# Patient Record
Sex: Female | Born: 1940 | Race: White | Hispanic: No | Smoking: Former smoker
Health system: Southern US, Community
[De-identification: ages and names within clinical notes are randomized; demographics above are authoritative.]

## PROBLEM LIST (undated history)

## (undated) DIAGNOSIS — E119 Type 2 diabetes mellitus without complications: Secondary | ICD-10-CM

## (undated) DIAGNOSIS — C3490 Malignant neoplasm of unspecified part of unspecified bronchus or lung: Secondary | ICD-10-CM

## (undated) DIAGNOSIS — I10 Essential (primary) hypertension: Secondary | ICD-10-CM

## (undated) DIAGNOSIS — C719 Malignant neoplasm of brain, unspecified: Secondary | ICD-10-CM

## (undated) HISTORY — PX: ABDOMINAL HYSTERECTOMY: SHX81

## (undated) HISTORY — DX: Essential (primary) hypertension: I10

## (undated) HISTORY — DX: Malignant neoplasm of unspecified part of unspecified bronchus or lung: C34.90

## (undated) HISTORY — DX: Malignant neoplasm of brain, unspecified: C71.9

## (undated) HISTORY — DX: Type 2 diabetes mellitus without complications: E11.9

## (undated) HISTORY — PX: APPENDECTOMY: SHX54

---

## 1997-01-27 DIAGNOSIS — I10 Essential (primary) hypertension: Secondary | ICD-10-CM | POA: Insufficient documentation

## 2005-05-27 ENCOUNTER — Ambulatory Visit: Payer: Self-pay

## 2006-09-02 ENCOUNTER — Ambulatory Visit: Payer: Self-pay | Admitting: Family Medicine

## 2006-09-21 ENCOUNTER — Ambulatory Visit: Payer: Self-pay | Admitting: Family Medicine

## 2007-02-18 ENCOUNTER — Inpatient Hospital Stay: Payer: Self-pay | Admitting: Internal Medicine

## 2008-02-13 ENCOUNTER — Emergency Department: Payer: Self-pay | Admitting: Emergency Medicine

## 2008-08-02 ENCOUNTER — Emergency Department: Payer: Self-pay | Admitting: Emergency Medicine

## 2008-08-02 ENCOUNTER — Other Ambulatory Visit: Payer: Self-pay

## 2008-10-26 ENCOUNTER — Emergency Department: Payer: Self-pay | Admitting: Emergency Medicine

## 2008-11-13 ENCOUNTER — Emergency Department: Payer: Self-pay | Admitting: Emergency Medicine

## 2008-11-14 ENCOUNTER — Emergency Department: Payer: Self-pay | Admitting: Emergency Medicine

## 2008-11-16 ENCOUNTER — Emergency Department: Payer: Self-pay | Admitting: Emergency Medicine

## 2008-11-24 DIAGNOSIS — E119 Type 2 diabetes mellitus without complications: Secondary | ICD-10-CM | POA: Insufficient documentation

## 2009-09-29 ENCOUNTER — Emergency Department: Payer: Self-pay | Admitting: Internal Medicine

## 2011-07-20 ENCOUNTER — Observation Stay: Payer: Self-pay | Admitting: Internal Medicine

## 2011-07-24 ENCOUNTER — Emergency Department: Payer: Self-pay | Admitting: Internal Medicine

## 2011-08-26 DIAGNOSIS — Z9849 Cataract extraction status, unspecified eye: Secondary | ICD-10-CM | POA: Insufficient documentation

## 2012-08-30 DIAGNOSIS — H35369 Drusen (degenerative) of macula, unspecified eye: Secondary | ICD-10-CM | POA: Insufficient documentation

## 2013-02-16 DIAGNOSIS — B029 Zoster without complications: Secondary | ICD-10-CM | POA: Insufficient documentation

## 2013-03-03 DIAGNOSIS — M109 Gout, unspecified: Secondary | ICD-10-CM | POA: Insufficient documentation

## 2013-03-03 DIAGNOSIS — D472 Monoclonal gammopathy: Secondary | ICD-10-CM | POA: Insufficient documentation

## 2013-03-09 DIAGNOSIS — E119 Type 2 diabetes mellitus without complications: Secondary | ICD-10-CM | POA: Insufficient documentation

## 2013-08-15 DIAGNOSIS — E785 Hyperlipidemia, unspecified: Secondary | ICD-10-CM | POA: Insufficient documentation

## 2013-08-15 DIAGNOSIS — K219 Gastro-esophageal reflux disease without esophagitis: Secondary | ICD-10-CM | POA: Insufficient documentation

## 2014-12-19 DIAGNOSIS — M545 Low back pain: Secondary | ICD-10-CM | POA: Diagnosis not present

## 2015-01-02 DIAGNOSIS — B029 Zoster without complications: Secondary | ICD-10-CM | POA: Diagnosis not present

## 2015-01-02 DIAGNOSIS — Z6826 Body mass index (BMI) 26.0-26.9, adult: Secondary | ICD-10-CM | POA: Diagnosis not present

## 2015-01-29 DIAGNOSIS — E119 Type 2 diabetes mellitus without complications: Secondary | ICD-10-CM | POA: Diagnosis not present

## 2015-01-29 DIAGNOSIS — Z6826 Body mass index (BMI) 26.0-26.9, adult: Secondary | ICD-10-CM | POA: Diagnosis not present

## 2015-01-29 DIAGNOSIS — M5412 Radiculopathy, cervical region: Secondary | ICD-10-CM | POA: Diagnosis not present

## 2015-02-05 DIAGNOSIS — G9589 Other specified diseases of spinal cord: Secondary | ICD-10-CM | POA: Diagnosis not present

## 2015-02-05 DIAGNOSIS — M5412 Radiculopathy, cervical region: Secondary | ICD-10-CM | POA: Diagnosis not present

## 2015-02-05 DIAGNOSIS — G9520 Unspecified cord compression: Secondary | ICD-10-CM | POA: Diagnosis not present

## 2015-02-05 DIAGNOSIS — M4722 Other spondylosis with radiculopathy, cervical region: Secondary | ICD-10-CM | POA: Diagnosis not present

## 2015-02-28 DIAGNOSIS — M5412 Radiculopathy, cervical region: Secondary | ICD-10-CM | POA: Diagnosis not present

## 2015-03-12 DIAGNOSIS — M5412 Radiculopathy, cervical region: Secondary | ICD-10-CM | POA: Diagnosis not present

## 2015-03-15 DIAGNOSIS — H04123 Dry eye syndrome of bilateral lacrimal glands: Secondary | ICD-10-CM | POA: Diagnosis not present

## 2015-03-15 DIAGNOSIS — H01006 Unspecified blepharitis left eye, unspecified eyelid: Secondary | ICD-10-CM | POA: Diagnosis not present

## 2015-03-15 DIAGNOSIS — H01003 Unspecified blepharitis right eye, unspecified eyelid: Secondary | ICD-10-CM | POA: Diagnosis not present

## 2015-03-15 DIAGNOSIS — H01009 Unspecified blepharitis unspecified eye, unspecified eyelid: Secondary | ICD-10-CM | POA: Diagnosis not present

## 2015-03-21 DIAGNOSIS — M5412 Radiculopathy, cervical region: Secondary | ICD-10-CM | POA: Diagnosis not present

## 2015-03-28 DIAGNOSIS — M5412 Radiculopathy, cervical region: Secondary | ICD-10-CM | POA: Diagnosis not present

## 2015-04-24 DIAGNOSIS — H5711 Ocular pain, right eye: Secondary | ICD-10-CM | POA: Diagnosis not present

## 2015-04-24 DIAGNOSIS — H00019 Hordeolum externum unspecified eye, unspecified eyelid: Secondary | ICD-10-CM | POA: Diagnosis not present

## 2015-04-24 DIAGNOSIS — H04123 Dry eye syndrome of bilateral lacrimal glands: Secondary | ICD-10-CM | POA: Diagnosis not present

## 2015-04-24 DIAGNOSIS — Z9841 Cataract extraction status, right eye: Secondary | ICD-10-CM | POA: Diagnosis not present

## 2015-05-04 DIAGNOSIS — H01003 Unspecified blepharitis right eye, unspecified eyelid: Secondary | ICD-10-CM | POA: Diagnosis not present

## 2015-05-04 DIAGNOSIS — H04123 Dry eye syndrome of bilateral lacrimal glands: Secondary | ICD-10-CM | POA: Diagnosis not present

## 2015-05-04 DIAGNOSIS — H01006 Unspecified blepharitis left eye, unspecified eyelid: Secondary | ICD-10-CM | POA: Diagnosis not present

## 2015-05-04 DIAGNOSIS — H35363 Drusen (degenerative) of macula, bilateral: Secondary | ICD-10-CM | POA: Diagnosis not present

## 2015-05-04 DIAGNOSIS — E119 Type 2 diabetes mellitus without complications: Secondary | ICD-10-CM | POA: Diagnosis not present

## 2015-05-04 DIAGNOSIS — Z961 Presence of intraocular lens: Secondary | ICD-10-CM | POA: Diagnosis not present

## 2015-05-04 DIAGNOSIS — H01009 Unspecified blepharitis unspecified eye, unspecified eyelid: Secondary | ICD-10-CM | POA: Diagnosis not present

## 2015-05-25 DIAGNOSIS — H35363 Drusen (degenerative) of macula, bilateral: Secondary | ICD-10-CM | POA: Diagnosis not present

## 2015-05-25 DIAGNOSIS — M3501 Sicca syndrome with keratoconjunctivitis: Secondary | ICD-10-CM | POA: Diagnosis not present

## 2015-05-25 DIAGNOSIS — H01003 Unspecified blepharitis right eye, unspecified eyelid: Secondary | ICD-10-CM | POA: Diagnosis not present

## 2015-05-25 DIAGNOSIS — F17200 Nicotine dependence, unspecified, uncomplicated: Secondary | ICD-10-CM | POA: Diagnosis not present

## 2015-05-25 DIAGNOSIS — H01006 Unspecified blepharitis left eye, unspecified eyelid: Secondary | ICD-10-CM | POA: Diagnosis not present

## 2015-07-13 DIAGNOSIS — K219 Gastro-esophageal reflux disease without esophagitis: Secondary | ICD-10-CM | POA: Diagnosis not present

## 2015-07-13 DIAGNOSIS — M810 Age-related osteoporosis without current pathological fracture: Secondary | ICD-10-CM | POA: Diagnosis not present

## 2015-07-13 DIAGNOSIS — Z0000 Encounter for general adult medical examination without abnormal findings: Secondary | ICD-10-CM | POA: Diagnosis not present

## 2015-07-13 DIAGNOSIS — E119 Type 2 diabetes mellitus without complications: Secondary | ICD-10-CM | POA: Diagnosis not present

## 2015-07-13 DIAGNOSIS — E785 Hyperlipidemia, unspecified: Secondary | ICD-10-CM | POA: Diagnosis not present

## 2015-07-13 DIAGNOSIS — H01009 Unspecified blepharitis unspecified eye, unspecified eyelid: Secondary | ICD-10-CM | POA: Diagnosis not present

## 2015-07-13 DIAGNOSIS — H9193 Unspecified hearing loss, bilateral: Secondary | ICD-10-CM | POA: Diagnosis not present

## 2015-07-13 DIAGNOSIS — I10 Essential (primary) hypertension: Secondary | ICD-10-CM | POA: Diagnosis not present

## 2015-08-01 DIAGNOSIS — N390 Urinary tract infection, site not specified: Secondary | ICD-10-CM | POA: Diagnosis not present

## 2015-08-01 DIAGNOSIS — I10 Essential (primary) hypertension: Secondary | ICD-10-CM | POA: Diagnosis not present

## 2015-10-16 DIAGNOSIS — E119 Type 2 diabetes mellitus without complications: Secondary | ICD-10-CM | POA: Diagnosis not present

## 2015-10-16 DIAGNOSIS — M3501 Sicca syndrome with keratoconjunctivitis: Secondary | ICD-10-CM | POA: Diagnosis not present

## 2015-10-16 DIAGNOSIS — Z9841 Cataract extraction status, right eye: Secondary | ICD-10-CM | POA: Diagnosis not present

## 2015-10-16 DIAGNOSIS — Z0389 Encounter for observation for other suspected diseases and conditions ruled out: Secondary | ICD-10-CM | POA: Diagnosis not present

## 2015-10-16 DIAGNOSIS — Z961 Presence of intraocular lens: Secondary | ICD-10-CM | POA: Diagnosis not present

## 2015-10-16 DIAGNOSIS — H0289 Other specified disorders of eyelid: Secondary | ICD-10-CM | POA: Diagnosis not present

## 2015-10-16 DIAGNOSIS — Z9842 Cataract extraction status, left eye: Secondary | ICD-10-CM | POA: Diagnosis not present

## 2015-12-12 DIAGNOSIS — Z720 Tobacco use: Secondary | ICD-10-CM | POA: Diagnosis not present

## 2015-12-12 DIAGNOSIS — I10 Essential (primary) hypertension: Secondary | ICD-10-CM | POA: Diagnosis not present

## 2015-12-12 DIAGNOSIS — N183 Chronic kidney disease, stage 3 (moderate): Secondary | ICD-10-CM | POA: Diagnosis not present

## 2015-12-12 DIAGNOSIS — E119 Type 2 diabetes mellitus without complications: Secondary | ICD-10-CM | POA: Diagnosis not present

## 2015-12-31 DIAGNOSIS — Z87891 Personal history of nicotine dependence: Secondary | ICD-10-CM | POA: Diagnosis not present

## 2016-02-29 DIAGNOSIS — Z122 Encounter for screening for malignant neoplasm of respiratory organs: Secondary | ICD-10-CM | POA: Diagnosis not present

## 2016-02-29 DIAGNOSIS — R918 Other nonspecific abnormal finding of lung field: Secondary | ICD-10-CM | POA: Diagnosis not present

## 2016-02-29 DIAGNOSIS — S2232XA Fracture of one rib, left side, initial encounter for closed fracture: Secondary | ICD-10-CM | POA: Diagnosis not present

## 2016-02-29 DIAGNOSIS — Z87891 Personal history of nicotine dependence: Secondary | ICD-10-CM | POA: Diagnosis not present

## 2016-03-18 DIAGNOSIS — R918 Other nonspecific abnormal finding of lung field: Secondary | ICD-10-CM | POA: Diagnosis not present

## 2016-03-18 DIAGNOSIS — R0602 Shortness of breath: Secondary | ICD-10-CM | POA: Diagnosis not present

## 2016-04-11 DIAGNOSIS — M47812 Spondylosis without myelopathy or radiculopathy, cervical region: Secondary | ICD-10-CM | POA: Diagnosis not present

## 2016-04-11 DIAGNOSIS — M542 Cervicalgia: Secondary | ICD-10-CM | POA: Diagnosis not present

## 2016-04-11 DIAGNOSIS — R918 Other nonspecific abnormal finding of lung field: Secondary | ICD-10-CM | POA: Diagnosis not present

## 2016-04-11 DIAGNOSIS — Z7189 Other specified counseling: Secondary | ICD-10-CM | POA: Diagnosis not present

## 2016-04-11 DIAGNOSIS — M4800 Spinal stenosis, site unspecified: Secondary | ICD-10-CM | POA: Diagnosis not present

## 2016-04-11 DIAGNOSIS — Z66 Do not resuscitate: Secondary | ICD-10-CM | POA: Diagnosis not present

## 2016-04-11 DIAGNOSIS — M5030 Other cervical disc degeneration, unspecified cervical region: Secondary | ICD-10-CM | POA: Diagnosis not present

## 2016-04-14 DIAGNOSIS — Z66 Do not resuscitate: Secondary | ICD-10-CM | POA: Insufficient documentation

## 2016-05-06 DIAGNOSIS — N390 Urinary tract infection, site not specified: Secondary | ICD-10-CM | POA: Diagnosis not present

## 2016-07-10 DIAGNOSIS — K219 Gastro-esophageal reflux disease without esophagitis: Secondary | ICD-10-CM | POA: Diagnosis not present

## 2016-07-10 DIAGNOSIS — F17200 Nicotine dependence, unspecified, uncomplicated: Secondary | ICD-10-CM | POA: Diagnosis not present

## 2016-07-10 DIAGNOSIS — E119 Type 2 diabetes mellitus without complications: Secondary | ICD-10-CM | POA: Diagnosis not present

## 2016-07-10 DIAGNOSIS — M109 Gout, unspecified: Secondary | ICD-10-CM | POA: Diagnosis not present

## 2016-07-10 DIAGNOSIS — M8580 Other specified disorders of bone density and structure, unspecified site: Secondary | ICD-10-CM | POA: Diagnosis not present

## 2016-07-10 DIAGNOSIS — Z23 Encounter for immunization: Secondary | ICD-10-CM | POA: Diagnosis not present

## 2016-07-10 DIAGNOSIS — G8929 Other chronic pain: Secondary | ICD-10-CM | POA: Diagnosis not present

## 2016-07-10 DIAGNOSIS — I10 Essential (primary) hypertension: Secondary | ICD-10-CM | POA: Diagnosis not present

## 2016-12-12 DIAGNOSIS — R29898 Other symptoms and signs involving the musculoskeletal system: Secondary | ICD-10-CM | POA: Diagnosis not present

## 2016-12-17 DIAGNOSIS — R29898 Other symptoms and signs involving the musculoskeletal system: Secondary | ICD-10-CM | POA: Diagnosis not present

## 2016-12-19 DIAGNOSIS — M9971 Connective tissue and disc stenosis of intervertebral foramina of cervical region: Secondary | ICD-10-CM | POA: Diagnosis not present

## 2016-12-19 DIAGNOSIS — M542 Cervicalgia: Secondary | ICD-10-CM | POA: Diagnosis not present

## 2016-12-19 DIAGNOSIS — G9520 Unspecified cord compression: Secondary | ICD-10-CM | POA: Diagnosis not present

## 2016-12-24 DIAGNOSIS — M5030 Other cervical disc degeneration, unspecified cervical region: Secondary | ICD-10-CM | POA: Diagnosis not present

## 2016-12-24 DIAGNOSIS — M5412 Radiculopathy, cervical region: Secondary | ICD-10-CM | POA: Diagnosis not present

## 2016-12-24 DIAGNOSIS — R29898 Other symptoms and signs involving the musculoskeletal system: Secondary | ICD-10-CM | POA: Diagnosis not present

## 2016-12-24 DIAGNOSIS — M542 Cervicalgia: Secondary | ICD-10-CM | POA: Diagnosis not present

## 2016-12-24 DIAGNOSIS — M4802 Spinal stenosis, cervical region: Secondary | ICD-10-CM | POA: Diagnosis not present

## 2016-12-29 DIAGNOSIS — I10 Essential (primary) hypertension: Secondary | ICD-10-CM | POA: Diagnosis not present

## 2016-12-29 DIAGNOSIS — E119 Type 2 diabetes mellitus without complications: Secondary | ICD-10-CM | POA: Diagnosis not present

## 2016-12-31 DIAGNOSIS — C3480 Malignant neoplasm of overlapping sites of unspecified bronchus and lung: Secondary | ICD-10-CM | POA: Diagnosis not present

## 2016-12-31 DIAGNOSIS — M4802 Spinal stenosis, cervical region: Secondary | ICD-10-CM | POA: Diagnosis not present

## 2016-12-31 DIAGNOSIS — R930 Abnormal findings on diagnostic imaging of skull and head, not elsewhere classified: Secondary | ICD-10-CM | POA: Diagnosis not present

## 2016-12-31 DIAGNOSIS — E0965 Drug or chemical induced diabetes mellitus with hyperglycemia: Secondary | ICD-10-CM | POA: Diagnosis not present

## 2016-12-31 DIAGNOSIS — G939 Disorder of brain, unspecified: Secondary | ICD-10-CM | POA: Diagnosis not present

## 2016-12-31 DIAGNOSIS — M1000 Idiopathic gout, unspecified site: Secondary | ICD-10-CM | POA: Diagnosis not present

## 2016-12-31 DIAGNOSIS — R531 Weakness: Secondary | ICD-10-CM | POA: Diagnosis not present

## 2016-12-31 DIAGNOSIS — G8194 Hemiplegia, unspecified affecting left nondominant side: Secondary | ICD-10-CM | POA: Diagnosis not present

## 2016-12-31 DIAGNOSIS — R918 Other nonspecific abnormal finding of lung field: Secondary | ICD-10-CM | POA: Diagnosis not present

## 2016-12-31 DIAGNOSIS — F17210 Nicotine dependence, cigarettes, uncomplicated: Secondary | ICD-10-CM | POA: Diagnosis not present

## 2016-12-31 DIAGNOSIS — K219 Gastro-esophageal reflux disease without esophagitis: Secondary | ICD-10-CM | POA: Diagnosis not present

## 2016-12-31 DIAGNOSIS — G9389 Other specified disorders of brain: Secondary | ICD-10-CM | POA: Diagnosis not present

## 2016-12-31 DIAGNOSIS — I10 Essential (primary) hypertension: Secondary | ICD-10-CM | POA: Diagnosis not present

## 2016-12-31 DIAGNOSIS — G729 Myopathy, unspecified: Secondary | ICD-10-CM | POA: Diagnosis not present

## 2016-12-31 DIAGNOSIS — Z888 Allergy status to other drugs, medicaments and biological substances status: Secondary | ICD-10-CM | POA: Diagnosis not present

## 2016-12-31 DIAGNOSIS — M810 Age-related osteoporosis without current pathological fracture: Secondary | ICD-10-CM | POA: Diagnosis not present

## 2016-12-31 DIAGNOSIS — G9519 Other vascular myelopathies: Secondary | ICD-10-CM | POA: Diagnosis not present

## 2016-12-31 DIAGNOSIS — G959 Disease of spinal cord, unspecified: Secondary | ICD-10-CM | POA: Diagnosis not present

## 2016-12-31 DIAGNOSIS — M6281 Muscle weakness (generalized): Secondary | ICD-10-CM | POA: Diagnosis not present

## 2016-12-31 DIAGNOSIS — R590 Localized enlarged lymph nodes: Secondary | ICD-10-CM | POA: Diagnosis not present

## 2016-12-31 DIAGNOSIS — G9520 Unspecified cord compression: Secondary | ICD-10-CM | POA: Diagnosis not present

## 2016-12-31 DIAGNOSIS — C3490 Malignant neoplasm of unspecified part of unspecified bronchus or lung: Secondary | ICD-10-CM | POA: Diagnosis not present

## 2016-12-31 DIAGNOSIS — D491 Neoplasm of unspecified behavior of respiratory system: Secondary | ICD-10-CM | POA: Diagnosis not present

## 2016-12-31 DIAGNOSIS — C7931 Secondary malignant neoplasm of brain: Secondary | ICD-10-CM | POA: Diagnosis not present

## 2016-12-31 DIAGNOSIS — E871 Hypo-osmolality and hyponatremia: Secondary | ICD-10-CM | POA: Diagnosis not present

## 2016-12-31 DIAGNOSIS — K769 Liver disease, unspecified: Secondary | ICD-10-CM | POA: Diagnosis not present

## 2016-12-31 DIAGNOSIS — C7951 Secondary malignant neoplasm of bone: Secondary | ICD-10-CM | POA: Diagnosis not present

## 2016-12-31 DIAGNOSIS — M109 Gout, unspecified: Secondary | ICD-10-CM | POA: Diagnosis not present

## 2017-01-02 DIAGNOSIS — G9389 Other specified disorders of brain: Secondary | ICD-10-CM | POA: Insufficient documentation

## 2017-01-03 DIAGNOSIS — C7800 Secondary malignant neoplasm of unspecified lung: Secondary | ICD-10-CM | POA: Insufficient documentation

## 2017-01-07 DIAGNOSIS — C7931 Secondary malignant neoplasm of brain: Secondary | ICD-10-CM | POA: Insufficient documentation

## 2017-01-07 DIAGNOSIS — C3480 Malignant neoplasm of overlapping sites of unspecified bronchus and lung: Secondary | ICD-10-CM | POA: Diagnosis not present

## 2017-01-14 DIAGNOSIS — G9389 Other specified disorders of brain: Secondary | ICD-10-CM | POA: Diagnosis not present

## 2017-01-14 DIAGNOSIS — D72829 Elevated white blood cell count, unspecified: Secondary | ICD-10-CM | POA: Diagnosis not present

## 2017-01-14 DIAGNOSIS — G8194 Hemiplegia, unspecified affecting left nondominant side: Secondary | ICD-10-CM | POA: Diagnosis not present

## 2017-01-14 DIAGNOSIS — N390 Urinary tract infection, site not specified: Secondary | ICD-10-CM | POA: Diagnosis not present

## 2017-01-14 DIAGNOSIS — E1165 Type 2 diabetes mellitus with hyperglycemia: Secondary | ICD-10-CM | POA: Diagnosis not present

## 2017-01-14 DIAGNOSIS — M5001 Cervical disc disorder with myelopathy,  high cervical region: Secondary | ICD-10-CM | POA: Diagnosis not present

## 2017-01-14 DIAGNOSIS — C3490 Malignant neoplasm of unspecified part of unspecified bronchus or lung: Secondary | ICD-10-CM | POA: Diagnosis not present

## 2017-01-14 DIAGNOSIS — R300 Dysuria: Secondary | ICD-10-CM | POA: Diagnosis not present

## 2017-01-14 DIAGNOSIS — E222 Syndrome of inappropriate secretion of antidiuretic hormone: Secondary | ICD-10-CM | POA: Diagnosis not present

## 2017-01-14 DIAGNOSIS — G729 Myopathy, unspecified: Secondary | ICD-10-CM | POA: Diagnosis not present

## 2017-01-14 DIAGNOSIS — C7931 Secondary malignant neoplasm of brain: Secondary | ICD-10-CM | POA: Diagnosis not present

## 2017-01-14 DIAGNOSIS — M1000 Idiopathic gout, unspecified site: Secondary | ICD-10-CM | POA: Diagnosis not present

## 2017-01-14 DIAGNOSIS — I10 Essential (primary) hypertension: Secondary | ICD-10-CM | POA: Diagnosis not present

## 2017-01-14 DIAGNOSIS — C7951 Secondary malignant neoplasm of bone: Secondary | ICD-10-CM | POA: Diagnosis not present

## 2017-01-14 DIAGNOSIS — G992 Myelopathy in diseases classified elsewhere: Secondary | ICD-10-CM | POA: Diagnosis not present

## 2017-01-14 DIAGNOSIS — C3480 Malignant neoplasm of overlapping sites of unspecified bronchus and lung: Secondary | ICD-10-CM | POA: Diagnosis not present

## 2017-01-14 DIAGNOSIS — M810 Age-related osteoporosis without current pathological fracture: Secondary | ICD-10-CM | POA: Diagnosis not present

## 2017-01-14 DIAGNOSIS — E871 Hypo-osmolality and hyponatremia: Secondary | ICD-10-CM | POA: Diagnosis not present

## 2017-01-14 DIAGNOSIS — C787 Secondary malignant neoplasm of liver and intrahepatic bile duct: Secondary | ICD-10-CM | POA: Diagnosis not present

## 2017-01-14 DIAGNOSIS — K219 Gastro-esophageal reflux disease without esophagitis: Secondary | ICD-10-CM | POA: Diagnosis not present

## 2017-01-24 DIAGNOSIS — E1122 Type 2 diabetes mellitus with diabetic chronic kidney disease: Secondary | ICD-10-CM | POA: Diagnosis not present

## 2017-01-24 DIAGNOSIS — G8252 Quadriplegia, C1-C4 incomplete: Secondary | ICD-10-CM | POA: Diagnosis not present

## 2017-01-24 DIAGNOSIS — C787 Secondary malignant neoplasm of liver and intrahepatic bile duct: Secondary | ICD-10-CM | POA: Diagnosis not present

## 2017-01-24 DIAGNOSIS — C3490 Malignant neoplasm of unspecified part of unspecified bronchus or lung: Secondary | ICD-10-CM | POA: Diagnosis not present

## 2017-01-24 DIAGNOSIS — C7931 Secondary malignant neoplasm of brain: Secondary | ICD-10-CM | POA: Diagnosis not present

## 2017-01-24 DIAGNOSIS — N183 Chronic kidney disease, stage 3 (moderate): Secondary | ICD-10-CM | POA: Diagnosis not present

## 2017-01-24 DIAGNOSIS — C7951 Secondary malignant neoplasm of bone: Secondary | ICD-10-CM | POA: Diagnosis not present

## 2017-01-24 DIAGNOSIS — S14104S Unspecified injury at C4 level of cervical spinal cord, sequela: Secondary | ICD-10-CM | POA: Diagnosis not present

## 2017-01-24 DIAGNOSIS — I129 Hypertensive chronic kidney disease with stage 1 through stage 4 chronic kidney disease, or unspecified chronic kidney disease: Secondary | ICD-10-CM | POA: Diagnosis not present

## 2017-01-27 DIAGNOSIS — G939 Disorder of brain, unspecified: Secondary | ICD-10-CM | POA: Diagnosis not present

## 2017-01-28 DIAGNOSIS — E1122 Type 2 diabetes mellitus with diabetic chronic kidney disease: Secondary | ICD-10-CM | POA: Diagnosis not present

## 2017-01-28 DIAGNOSIS — C7951 Secondary malignant neoplasm of bone: Secondary | ICD-10-CM | POA: Diagnosis not present

## 2017-01-28 DIAGNOSIS — G8252 Quadriplegia, C1-C4 incomplete: Secondary | ICD-10-CM | POA: Diagnosis not present

## 2017-01-28 DIAGNOSIS — N183 Chronic kidney disease, stage 3 (moderate): Secondary | ICD-10-CM | POA: Diagnosis not present

## 2017-01-28 DIAGNOSIS — C7931 Secondary malignant neoplasm of brain: Secondary | ICD-10-CM | POA: Diagnosis not present

## 2017-01-28 DIAGNOSIS — S14104S Unspecified injury at C4 level of cervical spinal cord, sequela: Secondary | ICD-10-CM | POA: Diagnosis not present

## 2017-01-28 DIAGNOSIS — C787 Secondary malignant neoplasm of liver and intrahepatic bile duct: Secondary | ICD-10-CM | POA: Diagnosis not present

## 2017-01-28 DIAGNOSIS — C3490 Malignant neoplasm of unspecified part of unspecified bronchus or lung: Secondary | ICD-10-CM | POA: Diagnosis not present

## 2017-01-28 DIAGNOSIS — I129 Hypertensive chronic kidney disease with stage 1 through stage 4 chronic kidney disease, or unspecified chronic kidney disease: Secondary | ICD-10-CM | POA: Diagnosis not present

## 2017-01-29 DIAGNOSIS — C3490 Malignant neoplasm of unspecified part of unspecified bronchus or lung: Secondary | ICD-10-CM | POA: Insufficient documentation

## 2017-01-29 DIAGNOSIS — C7931 Secondary malignant neoplasm of brain: Secondary | ICD-10-CM | POA: Diagnosis not present

## 2017-01-29 DIAGNOSIS — C7800 Secondary malignant neoplasm of unspecified lung: Secondary | ICD-10-CM | POA: Diagnosis not present

## 2017-01-29 DIAGNOSIS — E119 Type 2 diabetes mellitus without complications: Secondary | ICD-10-CM | POA: Diagnosis not present

## 2017-01-29 NOTE — Progress Notes (Signed)
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   INTERVAL HISTORY: Patient is a 76 year old female who initially presented with left upper and lower extremity weakness thought to be secondary to cervical stenosis. Subsequent MRI the brain revealed a large peripherally enhancing lesion in her right frontal lobe. Further workup with EBUS on January 05, 2017 confirmed small cell carcinoma of the lung. Patient's symptoms improved significantly with steroids. She completed XRT at [facility] and now is referred for continuation of systemic treatment. Currently she feels well. She has no new neurologic complaints. She denies any recent fevers or illnesses. She has a fair appetite, but denies weight loss. She has no chest pain, shortness of breath, cough, or hemoptysis. She denies any nausea, vomiting, constipation, or diarrhea. She has no urinary complaints. Patient otherwise feels well and offers no further specific complaints.  REVIEW OF SYSTEMS:   Review of Systems  Constitutional: Positive for malaise/fatigue. Negative for fever and weight loss.  Respiratory: Negative.  Negative for cough, hemoptysis and shortness of breath.   Cardiovascular: Negative.  Negative for chest pain and leg swelling.  Gastrointestinal: Negative.  Negative for abdominal pain.  Genitourinary: Negative.   Musculoskeletal: Negative.   Neurological: Positive for weakness. Negative for dizziness, sensory change, focal weakness, seizures and headaches.  Psychiatric/Behavioral: Negative.  The patient is not nervous/anxious.     As per HPI. Otherwise, a complete review of systems is negative.  PAST MEDICAL HISTORY: Past Medical  History:  Diagnosis Date  . Brain cancer ([place])   . Diabetes mellitus without complication ([place])   . Hypertension   . Lung cancer ([place])     PAST SURGICAL HISTORY: Past Surgical History:  Procedure Laterality Date  . ABDOMINAL HYSTERECTOMY     partial  . APPENDECTOMY      FAMILY HISTORY: Family History  Problem Relation Age of Onset  . Diabetes Mother   . Melanoma Sister   . Ovarian cancer Sister   . Diabetes Sister   . Diabetes Brother   . Diabetes Maternal Grandmother   . Diabetes Paternal Grandmother     ADVANCED DIRECTIVES (Y/N):  N  HEALTH MAINTENANCE: Social History  Substance Use Topics  . Smoking status: Former Research scientist (life sciences)  . Smokeless tobacco: Never Used  . Alcohol use No     Colonoscopy:  PAP:  Bone density:  Lipid panel:  Allergies  Allergen Reactions  . Ace Inhibitors Anaphylaxis  . Allopurinol Anaphylaxis  . Angiotensin Receptor Blockers Anaphylaxis  . Codeine Anaphylaxis  . Enalapril Swelling    Other reaction(s): SWELLING/EDEMA  . Furosemide Swelling    Other reaction(s): OTHER  . Hydralazine Swelling    Other reaction(s): SWELLING/EDEMA  . Hydrochlorothiazide Anaphylaxis  . Irbesartan Swelling    Other reaction(s): SWELLING/EDEMA  . Levofloxacin Swelling    Other reaction(s): SWELLING/EDEMA  . Lidocaine Swelling    Other reaction(s): SWELLING/EDEMA  . Naproxen Sodium Anaphylaxis  . Spironolactone Anaphylaxis  . Tramadol Anaphylaxis    Other reaction(s): NAUSEA    Current Outpatient Prescriptions  Medication Sig Dispense Refill  . alendronate (FOSAMAX) 70 MG tablet Take 70 mg by mouth every Friday.     Marland Kitchen amLODipine (NORVASC) 10 MG tablet Take 10 mg by mouth at  bedtime.     Marland Kitchen aspirin EC 81 MG tablet Take 81 mg by mouth daily.     . Calcium Carb-Cholecalciferol 600-800 MG-UNIT TABS Take 1 tablet by mouth daily.     . carvedilol (COREG) 25 MG tablet Take 50 mg by mouth 2 (two) times daily with a meal.     . cetirizine (ZYRTEC) 10 MG  tablet Take 10 mg by mouth at bedtime.     . cloNIDine (CATAPRES - DOSED IN MG/24 HR) 0.3 mg/24hr patch Place 0.3 mg onto the skin once a week. Change patch on Wednesdays    . colchicine-probenecid 0.5-500 MG tablet Take 1 tablet by mouth 2 (two) times daily.     Marland Kitchen docusate sodium (COLACE) 100 MG capsule Take 400 mg by mouth at bedtime.     Marland Kitchen erythromycin ophthalmic ointment Apply to eyelid margins every night    . esomeprazole (NEXIUM) 40 MG capsule Take 40 mg by mouth daily.     Marland Kitchen gabapentin (NEURONTIN) 300 MG capsule Take 300 mg by mouth 3 (three) times daily.     Marland Kitchen glucose blood (ACCU-CHEK COMPACT STRIPS) test strip Test daily before all meals/snacks and once before bedtime.    . Multiple Vitamins-Minerals (PRESERVISION AREDS 2 PO) Take 1 tablet by mouth 2 (two) times daily.    . nicotine (NICODERM CQ - DOSED IN MG/24 HOURS) 14 mg/24hr patch Place 14 mg onto the skin daily.     . simvastatin (ZOCOR) 20 MG tablet Take 20 mg by mouth every evening.     Marland Kitchen acetaminophen (TYLENOL) 500 MG tablet Take 500 mg by mouth 2 (two) times daily as needed for moderate pain.    . cholecalciferol (VITAMIN D) 400 units TABS tablet Take 400 Units by mouth daily.    . Hypromellose (ARTIFICIAL TEARS OP) Apply 1 drop to eye daily as needed (dry eyes).    . Melatonin 10 MG TABS Take 20 mg by mouth at bedtime.    . traZODone (DESYREL) 50 MG tablet Take 50 mg by mouth at bedtime as needed for sleep.      No current facility-administered medications for this visit.     OBJECTIVE: Vitals:   01/30/17 0908  BP: (!) 162/71  Pulse: 68  Resp: 18  Temp: 97.2 F (36.2 C)     There is no height or weight on file to calculate BMI.    ECOG FS:1 - Symptomatic but completely ambulatory  General: Well-developed, well-nourished, no acute distress. Eyes: Pink conjunctiva, anicteric sclera. HEENT: Normocephalic, moist mucous membranes, clear oropharnyx. Lungs: Clear to auscultation bilaterally. Heart: Regular rate and  rhythm. No rubs, murmurs, or gallops. Abdomen: Soft, nontender, nondistended. No organomegaly noted, normoactive bowel sounds. Musculoskeletal: No edema, cyanosis, or clubbing. Neuro: Alert, answering all questions appropriately. Cranial nerves grossly intact. Skin: No rashes or petechiae noted. Psych: Normal affect. Lymphatics: No cervical, calvicular, axillary or inguinal LAD.   LAB RESULTS:  No results found for: NA, K, CL, CO2, GLUCOSE, BUN, CREATININE, CALCIUM, PROT, ALBUMIN, AST, ALT, ALKPHOS, BILITOT, GFRNONAA, GFRAA  No results found for: WBC, NEUTROABS, HGB, HCT, MCV, PLT   STUDIES: No results found.  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   PLAN:    1. 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 No bone, hepatic, or other suspicious lesions were noted outside the brain. Primary lesion appears to be a 1.8 cm right upper lobe nodule. Patient was noted to have a 1.6 cm left hepatic lesion, but this was indeterminate and possibly a hemangioma. Liver lesion was PET negative. Patient has now completed XRT her brain and will now initiate systemic chemotherapy using carboplatinum and etoposide on day 1 with etoposide only on days 2 and 3. Patient will require Neulasta support. Plan to reimage after approximately 4 cycles. Patient has also agreed to port placement. She will return to clinic next week for chemotherapy education class and for placement and then on February 09, 2017 to initiate cycle 1, day 1 of treatment. 2. Metastatic brain lesion: MRI the brain completed at [facility] on January 01, 2017 revealed a 4.5 x 3.4 x 4.3 lesion with rim enhancement in the right frontal lobe. Patient has completed XRT and Decadron  taper with significant improvement of her left sided weakness.  Approximately 45 minutes was spent in discussion of which greater than 50% was consultation.                                                                                                                                                           Patient expressed understanding and was in agreement with this plan. She also understands that She can call clinic at any time with any questions, concerns, or complaints.   Cancer Staging Small cell lung cancer [facility]) Staging form: Lung, AJCC 8th Edition - Clinical stage from 01/31/2017: Stage IVB (cT1b, cN2, cM1c) - Signed by Lloyd Huger, MD on 01/31/2017   Lloyd Huger, MD   01/31/2017 12:40 PM

## 2017-01-30 ENCOUNTER — Other Ambulatory Visit (INDEPENDENT_AMBULATORY_CARE_PROVIDER_SITE_OTHER): Payer: Self-pay | Admitting: Vascular Surgery

## 2017-01-30 ENCOUNTER — Inpatient Hospital Stay: Payer: Medicare HMO | Attending: Oncology | Admitting: Oncology

## 2017-01-30 ENCOUNTER — Ambulatory Visit: Payer: Self-pay | Admitting: Oncology

## 2017-01-30 ENCOUNTER — Encounter: Payer: Self-pay | Admitting: Oncology

## 2017-01-30 VITALS — BP 162/71 | HR 68 | Temp 97.2°F | Resp 18 | Wt 133.4 lb

## 2017-01-30 DIAGNOSIS — R531 Weakness: Secondary | ICD-10-CM | POA: Insufficient documentation

## 2017-01-30 DIAGNOSIS — I10 Essential (primary) hypertension: Secondary | ICD-10-CM | POA: Insufficient documentation

## 2017-01-30 DIAGNOSIS — C3490 Malignant neoplasm of unspecified part of unspecified bronchus or lung: Secondary | ICD-10-CM

## 2017-01-30 DIAGNOSIS — Z79899 Other long term (current) drug therapy: Secondary | ICD-10-CM | POA: Insufficient documentation

## 2017-01-30 DIAGNOSIS — Z7982 Long term (current) use of aspirin: Secondary | ICD-10-CM | POA: Diagnosis not present

## 2017-01-30 DIAGNOSIS — C7931 Secondary malignant neoplasm of brain: Secondary | ICD-10-CM

## 2017-01-30 DIAGNOSIS — R5383 Other fatigue: Secondary | ICD-10-CM | POA: Diagnosis not present

## 2017-01-30 DIAGNOSIS — C3411 Malignant neoplasm of upper lobe, right bronchus or lung: Secondary | ICD-10-CM | POA: Diagnosis not present

## 2017-01-30 DIAGNOSIS — Z87891 Personal history of nicotine dependence: Secondary | ICD-10-CM | POA: Insufficient documentation

## 2017-01-30 DIAGNOSIS — Z7689 Persons encountering health services in other specified circumstances: Secondary | ICD-10-CM | POA: Insufficient documentation

## 2017-01-30 DIAGNOSIS — Z8041 Family history of malignant neoplasm of ovary: Secondary | ICD-10-CM | POA: Insufficient documentation

## 2017-01-30 DIAGNOSIS — Z5111 Encounter for antineoplastic chemotherapy: Secondary | ICD-10-CM | POA: Diagnosis not present

## 2017-01-30 DIAGNOSIS — E119 Type 2 diabetes mellitus without complications: Secondary | ICD-10-CM | POA: Diagnosis not present

## 2017-01-30 DIAGNOSIS — C3491 Malignant neoplasm of unspecified part of right bronchus or lung: Secondary | ICD-10-CM

## 2017-01-30 NOTE — Progress Notes (Signed)
Patient is here for new patient visit. She is doing very well no major complaints

## 2017-01-31 DIAGNOSIS — C7931 Secondary malignant neoplasm of brain: Secondary | ICD-10-CM | POA: Diagnosis not present

## 2017-01-31 DIAGNOSIS — M810 Age-related osteoporosis without current pathological fracture: Secondary | ICD-10-CM | POA: Diagnosis not present

## 2017-01-31 MED ORDER — PROCHLORPERAZINE MALEATE 10 MG PO TABS: 10.0000 mg | ORAL_TABLET | Freq: Four times a day (QID) | ORAL | 2 refills | Status: DC | PRN

## 2017-01-31 MED ORDER — ONDANSETRON HCL 8 MG PO TABS: 8.0000 mg | ORAL_TABLET | Freq: Two times a day (BID) | ORAL | 2 refills | Status: DC | PRN

## 2017-01-31 MED ORDER — LIDOCAINE-PRILOCAINE 2.5-2.5 % EX CREA: TOPICAL_CREAM | CUTANEOUS | 3 refills | Status: DC

## 2017-01-31 NOTE — Progress Notes (Signed)
START ON PATHWAY REGIMEN - Small Cell Lung     A cycle is every 21 days:     Etoposide      Carboplatin   **Always confirm dose/schedule in your pharmacy ordering system**    Patient Characteristics: Extensive Stage, First Line Stage Grouping: Extensive AJCC T Category: T1b AJCC N Category: N2 AJCC M Category: M1c AJCC 8 Stage Grouping: IVB Line of therapy: First Line Would you be surprised if this patient died  in the next year? I would NOT be surprised if this patient died in the next year  Intent of Therapy: Non-Curative / Palliative Intent, Discussed with Patient

## 2017-02-02 DIAGNOSIS — N183 Chronic kidney disease, stage 3 (moderate): Secondary | ICD-10-CM | POA: Diagnosis not present

## 2017-02-02 DIAGNOSIS — C3490 Malignant neoplasm of unspecified part of unspecified bronchus or lung: Secondary | ICD-10-CM | POA: Diagnosis not present

## 2017-02-02 DIAGNOSIS — E1122 Type 2 diabetes mellitus with diabetic chronic kidney disease: Secondary | ICD-10-CM | POA: Diagnosis not present

## 2017-02-02 DIAGNOSIS — G8252 Quadriplegia, C1-C4 incomplete: Secondary | ICD-10-CM | POA: Diagnosis not present

## 2017-02-02 DIAGNOSIS — C787 Secondary malignant neoplasm of liver and intrahepatic bile duct: Secondary | ICD-10-CM | POA: Diagnosis not present

## 2017-02-02 DIAGNOSIS — C7951 Secondary malignant neoplasm of bone: Secondary | ICD-10-CM | POA: Diagnosis not present

## 2017-02-02 DIAGNOSIS — I129 Hypertensive chronic kidney disease with stage 1 through stage 4 chronic kidney disease, or unspecified chronic kidney disease: Secondary | ICD-10-CM | POA: Diagnosis not present

## 2017-02-02 DIAGNOSIS — C7931 Secondary malignant neoplasm of brain: Secondary | ICD-10-CM | POA: Diagnosis not present

## 2017-02-02 DIAGNOSIS — S14104S Unspecified injury at C4 level of cervical spinal cord, sequela: Secondary | ICD-10-CM | POA: Diagnosis not present

## 2017-02-03 DIAGNOSIS — C3490 Malignant neoplasm of unspecified part of unspecified bronchus or lung: Secondary | ICD-10-CM | POA: Diagnosis not present

## 2017-02-03 DIAGNOSIS — C787 Secondary malignant neoplasm of liver and intrahepatic bile duct: Secondary | ICD-10-CM | POA: Diagnosis not present

## 2017-02-03 DIAGNOSIS — N183 Chronic kidney disease, stage 3 (moderate): Secondary | ICD-10-CM | POA: Diagnosis not present

## 2017-02-03 DIAGNOSIS — I129 Hypertensive chronic kidney disease with stage 1 through stage 4 chronic kidney disease, or unspecified chronic kidney disease: Secondary | ICD-10-CM | POA: Diagnosis not present

## 2017-02-03 DIAGNOSIS — S14104S Unspecified injury at C4 level of cervical spinal cord, sequela: Secondary | ICD-10-CM | POA: Diagnosis not present

## 2017-02-03 DIAGNOSIS — C7951 Secondary malignant neoplasm of bone: Secondary | ICD-10-CM | POA: Diagnosis not present

## 2017-02-03 DIAGNOSIS — G8252 Quadriplegia, C1-C4 incomplete: Secondary | ICD-10-CM | POA: Diagnosis not present

## 2017-02-03 DIAGNOSIS — C7931 Secondary malignant neoplasm of brain: Secondary | ICD-10-CM | POA: Diagnosis not present

## 2017-02-03 DIAGNOSIS — E1122 Type 2 diabetes mellitus with diabetic chronic kidney disease: Secondary | ICD-10-CM | POA: Diagnosis not present

## 2017-02-03 MED ORDER — CEFAZOLIN SODIUM-DEXTROSE 2-4 GM/100ML-% IV SOLN
2.0000 g | Freq: Once | INTRAVENOUS | Status: DC
  Filled 2017-02-03: qty 100

## 2017-02-03 MED ORDER — SODIUM CHLORIDE 0.9 % IR SOLN
Freq: Once | Status: DC
  Filled 2017-02-03: qty 2

## 2017-02-03 NOTE — Patient Instructions (Signed)
Etoposide, VP-16 injection What is this medicine? ETOPOSIDE, VP-16 (e toe POE side) is a chemotherapy drug. It is used to treat testicular cancer, lung cancer, and other cancers. This medicine may be used for other purposes; ask your health care provider or pharmacist if you have questions. COMMON BRAND NAME(S): Etopophos, Toposar, VePesid What should I tell my health care provider before I take this medicine? They need to know if you have any of these conditions: -infection -kidney disease -liver disease -low blood counts, like low white cell, platelet, or red cell counts -an unusual or allergic reaction to etoposide, other medicines, foods, dyes, or preservatives -pregnant or trying to get pregnant -breast-feeding How should I use this medicine? This medicine is for infusion into a vein. It is administered in a hospital or clinic by a specially trained health care professional. Talk to your pediatrician regarding the use of this medicine in children. Special care may be needed. Overdosage: If you think you have taken too much of this medicine contact a poison control center or emergency room at once. NOTE: This medicine is only for you. Do not share this medicine with others. What if I miss a dose? It is important not to miss your dose. Call your doctor or health care professional if you are unable to keep an appointment. What may interact with this medicine? -aspirin -certain medications for seizures like carbamazepine, phenobarbital, phenytoin, valproic acid -cyclosporine -levamisole -warfarin This list may not describe all possible interactions. Give your health care provider a list of all the medicines, herbs, non-prescription drugs, or dietary supplements you use. Also tell them if you smoke, drink alcohol, or use illegal drugs. Some items may interact with your medicine. What should I watch for while using this medicine? Visit your doctor for checks on your progress. This drug  may make you feel generally unwell. This is not uncommon, as chemotherapy can affect healthy cells as well as cancer cells. Report any side effects. Continue your course of treatment even though you feel ill unless your doctor tells you to stop. In some cases, you may be given additional medicines to help with side effects. Follow all directions for their use. Call your doctor or health care professional for advice if you get a fever, chills or sore throat, or other symptoms of a cold or flu. Do not treat yourself. This drug decreases your body's ability to fight infections. Try to avoid being around people who are sick. This medicine may increase your risk to bruise or bleed. Call your doctor or health care professional if you notice any unusual bleeding. Talk to your doctor about your risk of cancer. You may be more at risk for certain types of cancers if you take this medicine. Do not become pregnant while taking this medicine or for at least 6 months after stopping it. Women should inform their doctor if they wish to become pregnant or think they might be pregnant. Women of child-bearing potential will need to have a negative pregnancy test before starting this medicine. There is a potential for serious side effects to an unborn child. Talk to your health care professional or pharmacist for more information. Do not breast-feed an infant while taking this medicine. Men must use a latex condom during sexual contact with a woman while taking this medicine and for at least 4 months after stopping it. A latex condom is needed even if you have had a vasectomy. Contact your doctor right away if your partner becomes pregnant. Do   not donate sperm while taking this medicine and for at least 4 months after you stop taking this medicine. Men should inform their doctors if they wish to father a child. This medicine may lower sperm counts. What side effects may I notice from receiving this medicine? Side effects that  you should report to your doctor or health care professional as soon as possible: -allergic reactions like skin rash, itching or hives, swelling of the face, lips, or tongue -low blood counts - this medicine may decrease the number of white blood cells, red blood cells and platelets. You may be at increased risk for infections and bleeding. -signs of infection - fever or chills, cough, sore throat, pain or difficulty passing urine -signs of decreased platelets or bleeding - bruising, pinpoint red spots on the skin, black, tarry stools, blood in the urine -signs of decreased red blood cells - unusually weak or tired, fainting spells, lightheadedness -breathing problems -changes in vision -mouth or throat sores or ulcers -pain, redness, swelling or irritation at the injection site -pain, tingling, numbness in the hands or feet -redness, blistering, peeling or loosening of the skin, including inside the mouth -seizures -vomiting Side effects that usually do not require medical attention (report to your doctor or health care professional if they continue or are bothersome): -diarrhea -hair loss -loss of appetite -nausea -stomach pain This list may not describe all possible side effects. Call your doctor for medical advice about side effects. You may report side effects to FDA at 1-800-FDA-1088. Where should I keep my medicine? This drug is given in a hospital or clinic and will not be stored at home. NOTE: This sheet is a summary. It may not cover all possible information. If you have questions about this medicine, talk to your doctor, pharmacist, or health care provider.  2018 Elsevier/Gold Standard (2015-10-05 11:53:23) Carboplatin injection What is this medicine? CARBOPLATIN (KAR boe pla tin) is a chemotherapy drug. It targets fast dividing cells, like cancer cells, and causes these cells to die. This medicine is used to treat ovarian cancer and many other cancers. This medicine may be  used for other purposes; ask your health care provider or pharmacist if you have questions. COMMON BRAND NAME(S): Paraplatin What should I tell my health care provider before I take this medicine? They need to know if you have any of these conditions: -blood disorders -hearing problems -kidney disease -recent or ongoing radiation therapy -an unusual or allergic reaction to carboplatin, cisplatin, other chemotherapy, other medicines, foods, dyes, or preservatives -pregnant or trying to get pregnant -breast-feeding How should I use this medicine? This drug is usually given as an infusion into a vein. It is administered in a hospital or clinic by a specially trained health care professional. Talk to your pediatrician regarding the use of this medicine in children. Special care may be needed. Overdosage: If you think you have taken too much of this medicine contact a poison control center or emergency room at once. NOTE: This medicine is only for you. Do not share this medicine with others. What if I miss a dose? It is important not to miss a dose. Call your doctor or health care professional if you are unable to keep an appointment. What may interact with this medicine? -medicines for seizures -medicines to increase blood counts like filgrastim, pegfilgrastim, sargramostim -some antibiotics like amikacin, gentamicin, neomycin, streptomycin, tobramycin -vaccines Talk to your doctor or health care professional before taking any of these medicines: -acetaminophen -aspirin -ibuprofen -ketoprofen -naproxen   This list may not describe all possible interactions. Give your health care provider a list of all the medicines, herbs, non-prescription drugs, or dietary supplements you use. Also tell them if you smoke, drink alcohol, or use illegal drugs. Some items may interact with your medicine. What should I watch for while using this medicine? Your condition will be monitored carefully while you are  receiving this medicine. You will need important blood work done while you are taking this medicine. This drug may make you feel generally unwell. This is not uncommon, as chemotherapy can affect healthy cells as well as cancer cells. Report any side effects. Continue your course of treatment even though you feel ill unless your doctor tells you to stop. In some cases, you may be given additional medicines to help with side effects. Follow all directions for their use. Call your doctor or health care professional for advice if you get a fever, chills or sore throat, or other symptoms of a cold or flu. Do not treat yourself. This drug decreases your body's ability to fight infections. Try to avoid being around people who are sick. This medicine may increase your risk to bruise or bleed. Call your doctor or health care professional if you notice any unusual bleeding. Be careful brushing and flossing your teeth or using a toothpick because you may get an infection or bleed more easily. If you have any dental work done, tell your dentist you are receiving this medicine. Avoid taking products that contain aspirin, acetaminophen, ibuprofen, naproxen, or ketoprofen unless instructed by your doctor. These medicines may hide a fever. Do not become pregnant while taking this medicine. Women should inform their doctor if they wish to become pregnant or think they might be pregnant. There is a potential for serious side effects to an unborn child. Talk to your health care professional or pharmacist for more information. Do not breast-feed an infant while taking this medicine. What side effects may I notice from receiving this medicine? Side effects that you should report to your doctor or health care professional as soon as possible: -allergic reactions like skin rash, itching or hives, swelling of the face, lips, or tongue -signs of infection - fever or chills, cough, sore throat, pain or difficulty passing  urine -signs of decreased platelets or bleeding - bruising, pinpoint red spots on the skin, black, tarry stools, nosebleeds -signs of decreased red blood cells - unusually weak or tired, fainting spells, lightheadedness -breathing problems -changes in hearing -changes in vision -chest pain -high blood pressure -low blood counts - This drug may decrease the number of white blood cells, red blood cells and platelets. You may be at increased risk for infections and bleeding. -nausea and vomiting -pain, swelling, redness or irritation at the injection site -pain, tingling, numbness in the hands or feet -problems with balance, talking, walking -trouble passing urine or change in the amount of urine Side effects that usually do not require medical attention (report to your doctor or health care professional if they continue or are bothersome): -hair loss -loss of appetite -metallic taste in the mouth or changes in taste This list may not describe all possible side effects. Call your doctor for medical advice about side effects. You may report side effects to FDA at 1-800-FDA-1088. Where should I keep my medicine? This drug is given in a hospital or clinic and will not be stored at home. NOTE: This sheet is a summary. It may not cover all possible information. If you have  questions about this medicine, talk to your doctor, pharmacist, or health care provider.  2018 Elsevier/Gold Standard (2008-01-18 14:38:05)  

## 2017-02-04 ENCOUNTER — Ambulatory Visit
Admission: RE | Admit: 2017-02-04 | Discharge: 2017-02-04 | Disposition: A | Discharge status: Home / Self Care | Payer: Medicare HMO | Source: Ambulatory Visit | Attending: Vascular Surgery | Admitting: Vascular Surgery

## 2017-02-04 ENCOUNTER — Encounter
Admission: RE | Disposition: A | Discharge status: Home / Self Care | Payer: Self-pay | Source: Ambulatory Visit | Attending: Vascular Surgery

## 2017-02-04 DIAGNOSIS — Z881 Allergy status to other antibiotic agents status: Secondary | ICD-10-CM | POA: Insufficient documentation

## 2017-02-04 DIAGNOSIS — C3490 Malignant neoplasm of unspecified part of unspecified bronchus or lung: Secondary | ICD-10-CM | POA: Insufficient documentation

## 2017-02-04 DIAGNOSIS — Z87891 Personal history of nicotine dependence: Secondary | ICD-10-CM | POA: Diagnosis not present

## 2017-02-04 DIAGNOSIS — Z885 Allergy status to narcotic agent status: Secondary | ICD-10-CM | POA: Diagnosis not present

## 2017-02-04 DIAGNOSIS — Z888 Allergy status to other drugs, medicaments and biological substances status: Secondary | ICD-10-CM | POA: Diagnosis not present

## 2017-02-04 DIAGNOSIS — I10 Essential (primary) hypertension: Secondary | ICD-10-CM | POA: Diagnosis not present

## 2017-02-04 DIAGNOSIS — E119 Type 2 diabetes mellitus without complications: Secondary | ICD-10-CM | POA: Insufficient documentation

## 2017-02-04 DIAGNOSIS — Z9889 Other specified postprocedural states: Secondary | ICD-10-CM | POA: Diagnosis not present

## 2017-02-04 DIAGNOSIS — Z833 Family history of diabetes mellitus: Secondary | ICD-10-CM | POA: Insufficient documentation

## 2017-02-04 DIAGNOSIS — Z8041 Family history of malignant neoplasm of ovary: Secondary | ICD-10-CM | POA: Diagnosis not present

## 2017-02-04 DIAGNOSIS — Z90710 Acquired absence of both cervix and uterus: Secondary | ICD-10-CM | POA: Diagnosis not present

## 2017-02-04 DIAGNOSIS — C7931 Secondary malignant neoplasm of brain: Secondary | ICD-10-CM | POA: Diagnosis not present

## 2017-02-04 DIAGNOSIS — Z7982 Long term (current) use of aspirin: Secondary | ICD-10-CM | POA: Diagnosis not present

## 2017-02-04 DIAGNOSIS — Z808 Family history of malignant neoplasm of other organs or systems: Secondary | ICD-10-CM | POA: Diagnosis not present

## 2017-02-04 HISTORY — PX: PORTA CATH INSERTION: CATH118285

## 2017-02-04 LAB — GLUCOSE, CAPILLARY: Glucose-Capillary: 115 mg/dL — ABNORMAL HIGH (ref 65–99)

## 2017-02-04 SURGERY — PORTA CATH INSERTION
Anesthesia: Moderate Sedation

## 2017-02-04 MED ORDER — HEPARIN (PORCINE) IN NACL 2-0.9 UNIT/ML-% IJ SOLN
INTRAMUSCULAR | Status: AC
  Filled 2017-02-04: qty 500

## 2017-02-04 MED ORDER — MIDAZOLAM HCL 2 MG/2ML IJ SOLN
INTRAMUSCULAR | Status: DC | PRN
  Administered 2017-02-04: 2 mg via INTRAVENOUS

## 2017-02-04 MED ORDER — HYDROMORPHONE HCL 1 MG/ML IJ SOLN: 1.0000 mg | Freq: Once | INTRAMUSCULAR | Status: DC | PRN

## 2017-02-04 MED ORDER — SODIUM CHLORIDE 0.9 % IV SOLN
INTRAVENOUS | Status: DC
  Administered 2017-02-04: 13:00:00 via INTRAVENOUS

## 2017-02-04 MED ORDER — CEFAZOLIN IN D5W 1 GM/50ML IV SOLN
INTRAVENOUS | Status: AC
  Filled 2017-02-04: qty 50

## 2017-02-04 MED ORDER — ONDANSETRON HCL 4 MG/2ML IJ SOLN: 4.0000 mg | Freq: Four times a day (QID) | INTRAMUSCULAR | Status: DC | PRN

## 2017-02-04 MED ORDER — FENTANYL CITRATE (PF) 100 MCG/2ML IJ SOLN
INTRAMUSCULAR | Status: AC
  Filled 2017-02-04: qty 2

## 2017-02-04 MED ORDER — CEFAZOLIN IN D5W 1 GM/50ML IV SOLN
1.0000 g | Freq: Once | INTRAVENOUS | Status: AC
  Administered 2017-02-04: 1 g via INTRAVENOUS

## 2017-02-04 MED ORDER — FENTANYL CITRATE (PF) 100 MCG/2ML IJ SOLN
INTRAMUSCULAR | Status: DC | PRN
  Administered 2017-02-04: 50 ug via INTRAVENOUS

## 2017-02-04 MED ORDER — BUPIVACAINE HCL (PF) 0.25 % IJ SOLN
INTRAMUSCULAR | Status: AC
  Filled 2017-02-04: qty 30

## 2017-02-04 MED ORDER — MIDAZOLAM HCL 5 MG/5ML IJ SOLN
INTRAMUSCULAR | Status: AC
  Filled 2017-02-04: qty 5

## 2017-02-04 MED ORDER — LIDOCAINE-EPINEPHRINE (PF) 2 %-1:200000 IJ SOLN
INTRAMUSCULAR | Status: AC
  Filled 2017-02-04: qty 20

## 2017-02-04 SURGICAL SUPPLY — 10 items
CANNULA 5F STIFF (CANNULA) ×3 IMPLANT
DERMABOND ADVANCED (GAUZE/BANDAGES/DRESSINGS) ×2
DERMABOND ADVANCED .7 DNX12 (GAUZE/BANDAGES/DRESSINGS) ×1 IMPLANT
KIT PORT POWER 8FR ISP CVUE (Catheter) ×3 IMPLANT
PACK ANGIOGRAPHY (CUSTOM PROCEDURE TRAY) ×3 IMPLANT
PAD GROUND ADULT SPLIT (MISCELLANEOUS) ×3 IMPLANT
PENCIL ELECTRO HAND CTR (MISCELLANEOUS) ×3 IMPLANT
SUT MNCRL AB 4-0 PS2 18 (SUTURE) ×3 IMPLANT
SUT PROLENE 0 CT 1 30 (SUTURE) ×3 IMPLANT
SUTURE VIC 3-0 (SUTURE) ×3 IMPLANT

## 2017-02-04 NOTE — Discharge Instructions (Signed)
Implanted Port Insertion, Care After °This sheet gives you information about how to care for yourself after your procedure. Your health care provider may also give you more specific instructions. If you have problems or questions, contact your health care provider. °What can I expect after the procedure? °After your procedure, it is common to have: °· Discomfort at the port insertion site. °· Bruising on the skin over the port. This should improve over 3-4 days. ° °Follow these instructions at home: °Port care °· After your port is placed, you will get a manufacturer's information card. The card has information about your port. Keep this card with you at all times. °· Take care of the port as told by your health care provider. Ask your health care provider if you or a family member can get training for taking care of the port at home. A home health care nurse may also take care of the port. °· Make sure to remember what type of port you have. °Incision care °· Follow instructions from your health care provider about how to take care of your port insertion site. Make sure you: °? Wash your hands with soap and water before you change your bandage (dressing). If soap and water are not available, use hand sanitizer. °? Change your dressing as told by your health care provider. °? Leave stitches (sutures), skin glue, or adhesive strips in place. These skin closures may need to stay in place for 2 weeks or longer. If adhesive strip edges start to loosen and curl up, you may trim the loose edges. Do not remove adhesive strips completely unless your health care provider tells you to do that. °· Check your port insertion site every day for signs of infection. Check for: °? More redness, swelling, or pain. °? More fluid or blood. °? Warmth. °? Pus or a bad smell. °General instructions °· Do not take baths, swim, or use a hot tub until your health care provider approves. °· Do not lift anything that is heavier than 10 lb (4.5  kg) for a week, or as told by your health care provider. °· Ask your health care provider when it is okay to: °? Return to work or school. °? Resume usual physical activities or sports. °· Do not drive for 24 hours if you were given a medicine to help you relax (sedative). °· Take over-the-counter and prescription medicines only as told by your health care provider. °· Wear a medical alert bracelet in case of an emergency. This will tell any health care providers that you have a port. °· Keep all follow-up visits as told by your health care provider. This is important. °Contact a health care provider if: °· You cannot flush your port with saline as directed, or you cannot draw blood from the port. °· You have a fever or chills. °· You have more redness, swelling, or pain around your port insertion site. °· You have more fluid or blood coming from your port insertion site. °· Your port insertion site feels warm to the touch. °· You have pus or a bad smell coming from the port insertion site. °Get help right away if: °· You have chest pain or shortness of breath. °· You have bleeding from your port that you cannot control. °Summary °· Take care of the port as told by your health care provider. °· Change your dressing as told by your health care provider. °· Keep all follow-up visits as told by your health care provider. °  This information is not intended to replace advice given to you by your health care provider. Make sure you discuss any questions you have with your health care provider. °Document Released: 08/03/2013 Document Revised: 09/03/2016 Document Reviewed: 09/03/2016 °Elsevier Interactive Patient Education © 2017 [employer] ° °

## 2017-02-04 NOTE — Op Note (Signed)
      [facility]       Operative Note  Date: 02/04/2017  Preoperative diagnosis:  1. Lung cancer  Postoperative diagnosis:  Same as above  Procedures: #1. Ultrasound guidance for vascular access to the right internal jugular vein. #2. Fluoroscopic guidance for placement of catheter. #3. Placement of CT compatible Port-A-Cath, right internal jugular vein.  Surgeon: Leotis Pain, MD.   Anesthesia: Local with moderate conscious sedation for approximately 25 minutes using 2 mg of Versed and 50 mcg of Fentanyl  Fluoroscopy time: less than 1 minute  Estimated blood loss: minimal  Indication for the procedure:  The patient is a 76 y.o.female with Lung cancer.  The patient needs a Port-A-Cath for durable venous access, chemotherapy, lab draws, and CT scans. We are asked to place this. Risks and benefits were discussed and informed consent was obtained.  Description of procedure: The patient was brought to the vascular and interventional radiology suite.  Moderate conscious sedation was administered throughout the procedure during a face to face encounter with the patient with my supervision of the RN administering medicines and monitoring the patient's vital signs, pulse oximetry, telemetry and mental status throughout from the start of the procedure until the patient was taken to the recovery room. The right neck chest and shoulder were sterilely prepped and draped, and a sterile surgical field was created. Ultrasound was used to help visualize a patent right internal jugular vein. This was then accessed under direct ultrasound guidance with a mild amount of difficulty with the micropuncture needle and a permanent image was recorded. A micropuncture wire and sheath were then placed. A J-wire was placed. After skin nick and dilatation, the peel-away sheath was then placed over the wire. I then anesthetized an area under the clavicle approximately 1-2 fingerbreadths. A transverse  incision was created and an inferior pocket was created with electrocautery and blunt dissection. The port was then brought onto the field, placed into the pocket and secured to the chest wall with 2 Prolene sutures. The catheter was connected to the port and tunneled from the subclavicular incision to the access site. Fluoroscopic guidance was then used to cut the catheter to an appropriate length. The catheter was then placed through the peel-away sheath and the peel-away sheath was removed. The catheter tip was parked in excellent location under fluorocoscopic guidance in the cavoatrial junction. The pocket was then irrigated with antibiotic impregnated saline and the wound was closed with a running 3-0 Vicryl and a 4-0 Monocryl. The access incision was closed with a single 4-0 Monocryl. The Huber needle was used to withdraw blood and flush the port with heparinized saline. Dermabond was then placed as a dressing. The patient tolerated the procedure well and was taken to the recovery room in stable condition.   Leotis Pain 02/04/2017 2:27 PM   This note was created with Dragon Medical transcription system. Any errors in dictation are purely unintentional.

## 2017-02-04 NOTE — H&P (Signed)
[facility] History & Physical Update  The patient was interviewed and re-examined.  The patient's previous History and Physical has been reviewed and is unchanged.  There is no change in the plan of care. We plan to proceed with the scheduled procedure.  Leotis Pain, MD  02/04/2017, 12:52 PM

## 2017-02-05 ENCOUNTER — Encounter: Payer: Self-pay | Admitting: Vascular Surgery

## 2017-02-05 ENCOUNTER — Inpatient Hospital Stay: Payer: Medicare HMO

## 2017-02-06 DIAGNOSIS — C787 Secondary malignant neoplasm of liver and intrahepatic bile duct: Secondary | ICD-10-CM | POA: Diagnosis not present

## 2017-02-06 DIAGNOSIS — C7931 Secondary malignant neoplasm of brain: Secondary | ICD-10-CM | POA: Diagnosis not present

## 2017-02-06 DIAGNOSIS — C7951 Secondary malignant neoplasm of bone: Secondary | ICD-10-CM | POA: Diagnosis not present

## 2017-02-06 DIAGNOSIS — C3490 Malignant neoplasm of unspecified part of unspecified bronchus or lung: Secondary | ICD-10-CM | POA: Diagnosis not present

## 2017-02-06 DIAGNOSIS — E1122 Type 2 diabetes mellitus with diabetic chronic kidney disease: Secondary | ICD-10-CM | POA: Diagnosis not present

## 2017-02-06 DIAGNOSIS — I129 Hypertensive chronic kidney disease with stage 1 through stage 4 chronic kidney disease, or unspecified chronic kidney disease: Secondary | ICD-10-CM | POA: Diagnosis not present

## 2017-02-06 DIAGNOSIS — S14104S Unspecified injury at C4 level of cervical spinal cord, sequela: Secondary | ICD-10-CM | POA: Diagnosis not present

## 2017-02-06 DIAGNOSIS — G8252 Quadriplegia, C1-C4 incomplete: Secondary | ICD-10-CM | POA: Diagnosis not present

## 2017-02-06 DIAGNOSIS — N183 Chronic kidney disease, stage 3 (moderate): Secondary | ICD-10-CM | POA: Diagnosis not present

## 2017-02-08 NOTE — Progress Notes (Signed)
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   INTERVAL HISTORY: Patient returns to clinic today for further evaluation and to initiate cycle 1 of 4 of carboplatinum and etoposide. She currently feels well and is asymptomatic. She has no new neurologic complaints. She denies any recent fevers or illnesses. She has a fair appetite, but denies weight loss. She has no chest pain, shortness of breath, cough, or hemoptysis. She denies any nausea, vomiting, constipation, or diarrhea. She has no urinary complaints. Patient offers no specific complaints today.  REVIEW OF SYSTEMS:   Review of Systems  Constitutional: Positive for malaise/fatigue. Negative for fever and weight loss.  Respiratory: Negative.  Negative for cough, hemoptysis and shortness of breath.   Cardiovascular: Negative.  Negative for chest pain and leg swelling.  Gastrointestinal: Negative.  Negative for abdominal pain.  Genitourinary: Negative.   Musculoskeletal: Negative.   Neurological: Positive for weakness. Negative for dizziness, sensory change, focal weakness, seizures and headaches.  Psychiatric/Behavioral: Negative.  The patient is not nervous/anxious.     As per HPI. Otherwise, a complete review of systems is negative.  PAST MEDICAL HISTORY: Past Medical History:  Diagnosis Date  . Brain cancer ([facility])   . Diabetes mellitus without complication ([facility])   . Hypertension   . Lung cancer ([facility])     PAST SURGICAL HISTORY: Past Surgical History:  Procedure Laterality Date  . ABDOMINAL HYSTERECTOMY     partial  . APPENDECTOMY    . PORTA CATH INSERTION N/A 02/04/2017   Procedure: [name];  Surgeon:  Algernon Huxley, MD;  [facility]: [facility];  Service: Cardiovascular;  Laterality: N/A;    FAMILY HISTORY: Family History  Problem Relation Age of Onset  . Diabetes Mother   . Melanoma Sister   . Ovarian cancer Sister   . Diabetes Sister   . Diabetes Brother   . Diabetes Maternal Grandmother   . Diabetes Paternal Grandmother     ADVANCED DIRECTIVES (Y/N):  N  HEALTH MAINTENANCE: Social History  Substance Use Topics  . Smoking status: Former Research scientist (life sciences)  . Smokeless tobacco: Never Used  . Alcohol use No     Colonoscopy:  PAP:  Bone density:  Lipid panel:  Allergies  Allergen Reactions  . Ace Inhibitors Anaphylaxis  . Allopurinol Anaphylaxis  . Angiotensin Receptor Blockers Anaphylaxis  . Codeine Anaphylaxis  . Enalapril Swelling    Other reaction(s): SWELLING/EDEMA  . Furosemide Swelling    Other reaction(s): OTHER  . Hydralazine Swelling    Other reaction(s): SWELLING/EDEMA  . Hydrochlorothiazide Anaphylaxis  . Irbesartan Swelling    Other reaction(s): SWELLING/EDEMA  . Levofloxacin Swelling    Other reaction(s): SWELLING/EDEMA  . Lidocaine Swelling    Other reaction(s): SWELLING/EDEMA  . Naproxen Sodium Anaphylaxis  . Spironolactone Anaphylaxis  . Tramadol Anaphylaxis    Other reaction(s): NAUSEA    Current Outpatient Prescriptions  Medication Sig Dispense Refill  . acetaminophen (TYLENOL) 500 MG tablet Take 500 mg by mouth 2 (two) times daily as needed for moderate pain.    Marland Kitchen alendronate (FOSAMAX) 70 MG tablet Take 70 mg by mouth every Friday.     Marland Kitchen amLODipine (NORVASC) 10 MG tablet Take 10 mg by mouth at  bedtime.     Marland Kitchen aspirin EC 81 MG tablet Take 81 mg by mouth daily.     . Calcium Carb-Cholecalciferol 600-800 MG-UNIT TABS Take 1 tablet by mouth daily.     . carvedilol (COREG) 25 MG tablet Take 50 mg by mouth 2 (two) times daily with a meal.     . cetirizine (ZYRTEC) 10 MG tablet Take 10 mg by mouth at bedtime.     . cholecalciferol (VITAMIN D) 400  units TABS tablet Take 400 Units by mouth daily.    . cloNIDine (CATAPRES - DOSED IN MG/24 HR) 0.3 mg/24hr patch Place 0.3 mg onto the skin once a week. Change patch on Wednesdays    . colchicine-probenecid 0.5-500 MG tablet Take 1 tablet by mouth 2 (two) times daily.     Marland Kitchen docusate sodium (COLACE) 100 MG capsule Take 400 mg by mouth at bedtime.     Marland Kitchen erythromycin ophthalmic ointment Apply to eyelid margins every night    . esomeprazole (NEXIUM) 40 MG capsule Take 40 mg by mouth daily.     Marland Kitchen gabapentin (NEURONTIN) 300 MG capsule Take 300 mg by mouth 3 (three) times daily.     Marland Kitchen glucose blood (ACCU-CHEK COMPACT STRIPS) test strip Test daily before all meals/snacks and once before bedtime.    . Hypromellose (ARTIFICIAL TEARS OP) Apply 1 drop to eye daily as needed (dry eyes).    . Melatonin 10 MG TABS Take 20 mg by mouth at bedtime.    . Multiple Vitamins-Minerals (PRESERVISION AREDS 2 PO) Take 1 tablet by mouth 2 (two) times daily.    . nicotine (NICODERM CQ - DOSED IN MG/24 HOURS) 14 mg/24hr patch Place 14 mg onto the skin daily.     . ondansetron (ZOFRAN) 8 MG tablet Take 1 tablet (8 mg total) by mouth 2 (two) times daily as needed for refractory nausea / vomiting. 60 tablet 2  . prochlorperazine (COMPAZINE) 10 MG tablet Take 1 tablet (10 mg total) by mouth every 6 (six) hours as needed (Nausea or vomiting). 60 tablet 2  . simvastatin (ZOCOR) 20 MG tablet Take 20 mg by mouth every evening.     . traZODone (DESYREL) 50 MG tablet Take 50 mg by mouth at bedtime as needed for sleep.      No current facility-administered medications for this visit.     OBJECTIVE: Vitals:   02/09/17 0835  BP: 137/67  Pulse: 71  Resp: 18  Temp: (!) 95.6 F (35.3 C)     Body mass index is 22.49 kg/m.    ECOG FS:1 - Symptomatic but completely ambulatory  General: Well-developed, well-nourished, no acute distress. Eyes: Pink conjunctiva, anicteric sclera. Lungs: Clear to auscultation bilaterally. Heart:  Regular rate and rhythm. No rubs, murmurs, or gallops. Abdomen: Soft, nontender, nondistended. No organomegaly noted, normoactive bowel sounds. Musculoskeletal: No edema, cyanosis, or clubbing. Neuro: Alert, answering all questions appropriately. Cranial nerves grossly intact. Skin: No rashes or petechiae noted. Psych: Normal affect.   LAB RESULTS:  Lab Results  Component Value Date   NA 132 (L) 02/09/2017   K 3.7 02/09/2017   CL 96 (L) 02/09/2017   CO2 29 02/09/2017   GLUCOSE 192 (H) 02/09/2017   BUN 14 02/09/2017   CREATININE 0.62 02/09/2017   CALCIUM 8.3 (L) 02/09/2017   PROT 6.1 (L) 02/09/2017   ALBUMIN 2.8 (L) 02/09/2017   AST 23 02/09/2017   ALT 20 02/09/2017   ALKPHOS 98 02/09/2017   BILITOT 0.4 02/09/2017  GFRNONAA >60 02/09/2017   GFRAA >60 02/09/2017    Lab Results  Component Value Date   WBC 9.8 02/09/2017   NEUTROABS 7.0 (H) 02/09/2017   HGB 13.2 02/09/2017   HCT 37.5 02/09/2017   MCV 87.8 02/09/2017   PLT 378 02/09/2017     STUDIES: No results found.  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   PLAN:    1. 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 No bone, hepatic, or other suspicious lesions were noted outside the brain. Primary lesion appears to be a 1.8 cm right upper lobe nodule. Patient was noted to have a 1.6 cm left hepatic lesion, but this was indeterminate and possibly a hemangioma. Liver lesion was PET negative. Patient completed XRT her brain. Proceed with cycle 1, day 1 of carboplatinum and etoposide. Return to clinic in 1 and 2 days for etoposide only. Patient will require Neulasta support. Plan to reimage after approximately 4 cycles. Return to clinic in 1 week  for laboratory work and further evaluation and then in 3 weeks for consideration of cycle 2.  2. Metastatic brain lesion: MRI the brain completed at [facility] on January 01, 2017 revealed a 4.5 x 3.4 x 4.3 lesion with rim enhancement in the right frontal lobe. Patient has completed XRT and Decadron taper with significant improvement of her left sided weakness.  Approximately 30 minutes was spent in discussion of which greater than 50% was consultation.                                                                                                                                                           Patient expressed understanding and was in agreement with this plan. She also understands that She can call clinic at any time with any questions, concerns, or complaints.   Cancer Staging Small cell lung cancer [facility]) Staging form: Lung, AJCC 8th Edition - Clinical stage from 01/31/2017: Stage IVB (cT1b, cN2, cM1c) - Signed by Lloyd Huger, MD on 01/31/2017   Lloyd Huger, MD   02/15/2017 7:03 AM

## 2017-02-09 ENCOUNTER — Inpatient Hospital Stay (HOSPITAL_BASED_OUTPATIENT_CLINIC_OR_DEPARTMENT_OTHER): Payer: Medicare HMO | Admitting: Oncology

## 2017-02-09 ENCOUNTER — Inpatient Hospital Stay: Payer: Medicare HMO

## 2017-02-09 VITALS — BP 137/67 | HR 71 | Temp 95.6°F | Resp 18 | Wt 133.1 lb

## 2017-02-09 DIAGNOSIS — Z87891 Personal history of nicotine dependence: Secondary | ICD-10-CM | POA: Diagnosis not present

## 2017-02-09 DIAGNOSIS — C7931 Secondary malignant neoplasm of brain: Secondary | ICD-10-CM

## 2017-02-09 DIAGNOSIS — C3411 Malignant neoplasm of upper lobe, right bronchus or lung: Secondary | ICD-10-CM

## 2017-02-09 DIAGNOSIS — C3490 Malignant neoplasm of unspecified part of unspecified bronchus or lung: Secondary | ICD-10-CM

## 2017-02-09 DIAGNOSIS — Z7689 Persons encountering health services in other specified circumstances: Secondary | ICD-10-CM

## 2017-02-09 DIAGNOSIS — E119 Type 2 diabetes mellitus without complications: Secondary | ICD-10-CM

## 2017-02-09 DIAGNOSIS — Z7982 Long term (current) use of aspirin: Secondary | ICD-10-CM

## 2017-02-09 DIAGNOSIS — R531 Weakness: Secondary | ICD-10-CM

## 2017-02-09 DIAGNOSIS — Z8041 Family history of malignant neoplasm of ovary: Secondary | ICD-10-CM | POA: Diagnosis not present

## 2017-02-09 DIAGNOSIS — I10 Essential (primary) hypertension: Secondary | ICD-10-CM

## 2017-02-09 DIAGNOSIS — C3491 Malignant neoplasm of unspecified part of right bronchus or lung: Secondary | ICD-10-CM

## 2017-02-09 DIAGNOSIS — Z79899 Other long term (current) drug therapy: Secondary | ICD-10-CM | POA: Diagnosis not present

## 2017-02-09 DIAGNOSIS — R5383 Other fatigue: Secondary | ICD-10-CM

## 2017-02-09 DIAGNOSIS — Z5111 Encounter for antineoplastic chemotherapy: Secondary | ICD-10-CM | POA: Diagnosis not present

## 2017-02-09 LAB — COMPREHENSIVE METABOLIC PANEL
ALK PHOS: 98 U/L (ref 38–126)
ALT: 20 U/L (ref 14–54)
ANION GAP: 7 (ref 5–15)
AST: 23 U/L (ref 15–41)
Albumin: 2.8 g/dL — ABNORMAL LOW (ref 3.5–5.0)
BILIRUBIN TOTAL: 0.4 mg/dL (ref 0.3–1.2)
BUN: 14 mg/dL (ref 6–20)
CALCIUM: 8.3 mg/dL — AB (ref 8.9–10.3)
CO2: 29 mmol/L (ref 22–32)
CREATININE: 0.62 mg/dL (ref 0.44–1.00)
Chloride: 96 mmol/L — ABNORMAL LOW (ref 101–111)
GLUCOSE: 192 mg/dL — AB (ref 65–99)
Potassium: 3.7 mmol/L (ref 3.5–5.1)
Sodium: 132 mmol/L — ABNORMAL LOW (ref 135–145)
Total Protein: 6.1 g/dL — ABNORMAL LOW (ref 6.5–8.1)

## 2017-02-09 LAB — CBC WITH DIFFERENTIAL/PLATELET
Basophils Absolute: 0.1 10*3/uL (ref 0–0.1)
Basophils Relative: 1 %
Eosinophils Absolute: 0.1 10*3/uL (ref 0–0.7)
Eosinophils Relative: 1 %
HEMATOCRIT: 37.5 % (ref 35.0–47.0)
HEMOGLOBIN: 13.2 g/dL (ref 12.0–16.0)
LYMPHS ABS: 1.4 10*3/uL (ref 1.0–3.6)
Lymphocytes Relative: 14 %
MCH: 30.9 pg (ref 26.0–34.0)
MCHC: 35.2 g/dL (ref 32.0–36.0)
MCV: 87.8 fL (ref 80.0–100.0)
MONO ABS: 1.2 10*3/uL — AB (ref 0.2–0.9)
MONOS PCT: 12 %
NEUTROS ABS: 7 10*3/uL — AB (ref 1.4–6.5)
Neutrophils Relative %: 72 %
Platelets: 378 10*3/uL (ref 150–440)
RBC: 4.27 MIL/uL (ref 3.80–5.20)
RDW: 15.6 % — ABNORMAL HIGH (ref 11.5–14.5)
WBC: 9.8 10*3/uL (ref 3.6–11.0)

## 2017-02-09 MED ORDER — SODIUM CHLORIDE 0.9 % IV SOLN
100.0000 mg/m2 | Freq: Once | INTRAVENOUS | Status: AC
  Administered 2017-02-09: 170 mg via INTRAVENOUS
  Filled 2017-02-09: qty 8.5

## 2017-02-09 MED ORDER — PALONOSETRON HCL INJECTION 0.25 MG/5ML
0.2500 mg | Freq: Once | INTRAVENOUS | Status: AC
  Administered 2017-02-09: 0.25 mg via INTRAVENOUS
  Filled 2017-02-09: qty 5

## 2017-02-09 MED ORDER — SODIUM CHLORIDE 0.9 % IV SOLN
Freq: Once | INTRAVENOUS | Status: AC
  Administered 2017-02-09: 10:00:00 via INTRAVENOUS
  Filled 2017-02-09: qty 1000

## 2017-02-09 MED ORDER — HEPARIN SOD (PORK) LOCK FLUSH 100 UNIT/ML IV SOLN
500.0000 [IU] | Freq: Once | INTRAVENOUS | Status: AC
  Administered 2017-02-09: 500 [IU] via INTRAVENOUS
  Filled 2017-02-09: qty 5

## 2017-02-09 MED ORDER — SODIUM CHLORIDE 0.9% FLUSH
10.0000 mL | Freq: Once | INTRAVENOUS | Status: AC
  Administered 2017-02-09: 10 mL via INTRAVENOUS
  Filled 2017-02-09: qty 10

## 2017-02-09 MED ORDER — HEPARIN SOD (PORK) LOCK FLUSH 100 UNIT/ML IV SOLN: 500.0000 [IU] | Freq: Once | INTRAVENOUS | Status: DC | PRN

## 2017-02-09 MED ORDER — SODIUM CHLORIDE 0.9% FLUSH
10.0000 mL | Freq: Once | INTRAVENOUS | Status: AC
Start: 2017-02-09 — End: 2017-02-09
  Administered 2017-02-09: 10 mL via INTRAVENOUS
  Filled 2017-02-09: qty 10

## 2017-02-09 MED ORDER — SODIUM CHLORIDE 0.9 % IV SOLN: 10.0000 mg | Freq: Once | INTRAVENOUS | Status: DC

## 2017-02-09 MED ORDER — DEXAMETHASONE SODIUM PHOSPHATE 10 MG/ML IJ SOLN
10.0000 mg | Freq: Once | INTRAMUSCULAR | Status: AC
  Administered 2017-02-09: 10 mg via INTRAVENOUS
  Filled 2017-02-09: qty 1

## 2017-02-09 MED ORDER — SODIUM CHLORIDE 0.9 % IV SOLN
350.0000 mg | Freq: Once | INTRAVENOUS | Status: AC
  Administered 2017-02-09: 350 mg via INTRAVENOUS
  Filled 2017-02-09: qty 35

## 2017-02-09 NOTE — Progress Notes (Signed)
Offers no complaints. States is nervous about first treatment today.

## 2017-02-10 ENCOUNTER — Inpatient Hospital Stay: Payer: Medicare HMO

## 2017-02-10 VITALS — BP 134/68 | HR 70 | Temp 96.2°F | Resp 18

## 2017-02-10 DIAGNOSIS — E1122 Type 2 diabetes mellitus with diabetic chronic kidney disease: Secondary | ICD-10-CM | POA: Diagnosis not present

## 2017-02-10 DIAGNOSIS — C7931 Secondary malignant neoplasm of brain: Secondary | ICD-10-CM | POA: Diagnosis not present

## 2017-02-10 DIAGNOSIS — C7951 Secondary malignant neoplasm of bone: Secondary | ICD-10-CM | POA: Diagnosis not present

## 2017-02-10 DIAGNOSIS — Z87891 Personal history of nicotine dependence: Secondary | ICD-10-CM | POA: Diagnosis not present

## 2017-02-10 DIAGNOSIS — I10 Essential (primary) hypertension: Secondary | ICD-10-CM | POA: Diagnosis not present

## 2017-02-10 DIAGNOSIS — G8252 Quadriplegia, C1-C4 incomplete: Secondary | ICD-10-CM | POA: Diagnosis not present

## 2017-02-10 DIAGNOSIS — N183 Chronic kidney disease, stage 3 (moderate): Secondary | ICD-10-CM | POA: Diagnosis not present

## 2017-02-10 DIAGNOSIS — I129 Hypertensive chronic kidney disease with stage 1 through stage 4 chronic kidney disease, or unspecified chronic kidney disease: Secondary | ICD-10-CM | POA: Diagnosis not present

## 2017-02-10 DIAGNOSIS — E119 Type 2 diabetes mellitus without complications: Secondary | ICD-10-CM | POA: Diagnosis not present

## 2017-02-10 DIAGNOSIS — Z5111 Encounter for antineoplastic chemotherapy: Secondary | ICD-10-CM | POA: Diagnosis not present

## 2017-02-10 DIAGNOSIS — C3490 Malignant neoplasm of unspecified part of unspecified bronchus or lung: Secondary | ICD-10-CM | POA: Diagnosis not present

## 2017-02-10 DIAGNOSIS — C787 Secondary malignant neoplasm of liver and intrahepatic bile duct: Secondary | ICD-10-CM | POA: Diagnosis not present

## 2017-02-10 DIAGNOSIS — S14104S Unspecified injury at C4 level of cervical spinal cord, sequela: Secondary | ICD-10-CM | POA: Diagnosis not present

## 2017-02-10 DIAGNOSIS — R5383 Other fatigue: Secondary | ICD-10-CM | POA: Diagnosis not present

## 2017-02-10 DIAGNOSIS — Z7689 Persons encountering health services in other specified circumstances: Secondary | ICD-10-CM | POA: Diagnosis not present

## 2017-02-10 DIAGNOSIS — C3491 Malignant neoplasm of unspecified part of right bronchus or lung: Secondary | ICD-10-CM

## 2017-02-10 DIAGNOSIS — R531 Weakness: Secondary | ICD-10-CM | POA: Diagnosis not present

## 2017-02-10 DIAGNOSIS — C3411 Malignant neoplasm of upper lobe, right bronchus or lung: Secondary | ICD-10-CM | POA: Diagnosis not present

## 2017-02-10 MED ORDER — DEXAMETHASONE SODIUM PHOSPHATE 10 MG/ML IJ SOLN
10.0000 mg | Freq: Once | INTRAMUSCULAR | Status: AC
  Administered 2017-02-10: 10 mg via INTRAVENOUS
  Filled 2017-02-10: qty 1

## 2017-02-10 MED ORDER — SODIUM CHLORIDE 0.9 % IV SOLN
100.0000 mg/m2 | Freq: Once | INTRAVENOUS | Status: AC
  Administered 2017-02-10: 170 mg via INTRAVENOUS
  Filled 2017-02-10: qty 8.5

## 2017-02-10 MED ORDER — HEPARIN SOD (PORK) LOCK FLUSH 100 UNIT/ML IV SOLN
500.0000 [IU] | Freq: Once | INTRAVENOUS | Status: AC | PRN
  Administered 2017-02-10: 500 [IU]
  Filled 2017-02-10: qty 5

## 2017-02-10 MED ORDER — SODIUM CHLORIDE 0.9% FLUSH
10.0000 mL | INTRAVENOUS | Status: DC | PRN
  Administered 2017-02-10: 10 mL
  Filled 2017-02-10: qty 10

## 2017-02-10 MED ORDER — SODIUM CHLORIDE 0.9 % IV SOLN: 10.0000 mg | Freq: Once | INTRAVENOUS | Status: DC

## 2017-02-10 MED ORDER — SODIUM CHLORIDE 0.9 % IV SOLN
Freq: Once | INTRAVENOUS | Status: AC
  Administered 2017-02-10: 09:00:00 via INTRAVENOUS
  Filled 2017-02-10: qty 1000

## 2017-02-11 ENCOUNTER — Inpatient Hospital Stay: Payer: Medicare HMO

## 2017-02-11 VITALS — BP 127/70 | HR 70 | Temp 96.2°F | Resp 18

## 2017-02-11 DIAGNOSIS — C3491 Malignant neoplasm of unspecified part of right bronchus or lung: Secondary | ICD-10-CM

## 2017-02-11 DIAGNOSIS — R531 Weakness: Secondary | ICD-10-CM | POA: Diagnosis not present

## 2017-02-11 DIAGNOSIS — E119 Type 2 diabetes mellitus without complications: Secondary | ICD-10-CM | POA: Diagnosis not present

## 2017-02-11 DIAGNOSIS — Z7689 Persons encountering health services in other specified circumstances: Secondary | ICD-10-CM | POA: Diagnosis not present

## 2017-02-11 DIAGNOSIS — I10 Essential (primary) hypertension: Secondary | ICD-10-CM | POA: Diagnosis not present

## 2017-02-11 DIAGNOSIS — C7931 Secondary malignant neoplasm of brain: Secondary | ICD-10-CM | POA: Diagnosis not present

## 2017-02-11 DIAGNOSIS — Z5111 Encounter for antineoplastic chemotherapy: Secondary | ICD-10-CM | POA: Diagnosis not present

## 2017-02-11 DIAGNOSIS — Z87891 Personal history of nicotine dependence: Secondary | ICD-10-CM | POA: Diagnosis not present

## 2017-02-11 DIAGNOSIS — R5383 Other fatigue: Secondary | ICD-10-CM | POA: Diagnosis not present

## 2017-02-11 DIAGNOSIS — C3411 Malignant neoplasm of upper lobe, right bronchus or lung: Secondary | ICD-10-CM | POA: Diagnosis not present

## 2017-02-11 MED ORDER — DEXAMETHASONE SODIUM PHOSPHATE 10 MG/ML IJ SOLN
10.0000 mg | Freq: Once | INTRAMUSCULAR | Status: AC
  Administered 2017-02-11: 10 mg via INTRAVENOUS
  Filled 2017-02-11: qty 1

## 2017-02-11 MED ORDER — HEPARIN SOD (PORK) LOCK FLUSH 100 UNIT/ML IV SOLN
500.0000 [IU] | Freq: Once | INTRAVENOUS | Status: AC | PRN
  Administered 2017-02-11: 500 [IU]
  Filled 2017-02-11: qty 5

## 2017-02-11 MED ORDER — PEGFILGRASTIM 6 MG/0.6ML ~~LOC~~ PSKT
6.0000 mg | PREFILLED_SYRINGE | Freq: Once | SUBCUTANEOUS | Status: AC
  Administered 2017-02-11: 6 mg via SUBCUTANEOUS
  Filled 2017-02-11: qty 0.6

## 2017-02-11 MED ORDER — SODIUM CHLORIDE 0.9% FLUSH
10.0000 mL | INTRAVENOUS | Status: DC | PRN
  Administered 2017-02-11: 10 mL
  Filled 2017-02-11: qty 10

## 2017-02-11 MED ORDER — SODIUM CHLORIDE 0.9 % IV SOLN
Freq: Once | INTRAVENOUS | Status: AC
  Administered 2017-02-11: 09:00:00 via INTRAVENOUS
  Filled 2017-02-11: qty 1000

## 2017-02-11 MED ORDER — SODIUM CHLORIDE 0.9 % IV SOLN
100.0000 mg/m2 | Freq: Once | INTRAVENOUS | Status: AC
  Administered 2017-02-11: 170 mg via INTRAVENOUS
  Filled 2017-02-11: qty 8.5

## 2017-02-12 DIAGNOSIS — C3490 Malignant neoplasm of unspecified part of unspecified bronchus or lung: Secondary | ICD-10-CM | POA: Diagnosis not present

## 2017-02-12 DIAGNOSIS — G8252 Quadriplegia, C1-C4 incomplete: Secondary | ICD-10-CM | POA: Diagnosis not present

## 2017-02-12 DIAGNOSIS — E1122 Type 2 diabetes mellitus with diabetic chronic kidney disease: Secondary | ICD-10-CM | POA: Diagnosis not present

## 2017-02-12 DIAGNOSIS — C7931 Secondary malignant neoplasm of brain: Secondary | ICD-10-CM | POA: Diagnosis not present

## 2017-02-12 DIAGNOSIS — S14104S Unspecified injury at C4 level of cervical spinal cord, sequela: Secondary | ICD-10-CM | POA: Diagnosis not present

## 2017-02-12 DIAGNOSIS — N183 Chronic kidney disease, stage 3 (moderate): Secondary | ICD-10-CM | POA: Diagnosis not present

## 2017-02-12 DIAGNOSIS — C787 Secondary malignant neoplasm of liver and intrahepatic bile duct: Secondary | ICD-10-CM | POA: Diagnosis not present

## 2017-02-12 DIAGNOSIS — C7951 Secondary malignant neoplasm of bone: Secondary | ICD-10-CM | POA: Diagnosis not present

## 2017-02-12 DIAGNOSIS — I129 Hypertensive chronic kidney disease with stage 1 through stage 4 chronic kidney disease, or unspecified chronic kidney disease: Secondary | ICD-10-CM | POA: Diagnosis not present

## 2017-02-14 DIAGNOSIS — C3490 Malignant neoplasm of unspecified part of unspecified bronchus or lung: Secondary | ICD-10-CM | POA: Diagnosis not present

## 2017-02-14 DIAGNOSIS — I129 Hypertensive chronic kidney disease with stage 1 through stage 4 chronic kidney disease, or unspecified chronic kidney disease: Secondary | ICD-10-CM | POA: Diagnosis not present

## 2017-02-14 DIAGNOSIS — C7951 Secondary malignant neoplasm of bone: Secondary | ICD-10-CM | POA: Diagnosis not present

## 2017-02-14 DIAGNOSIS — N183 Chronic kidney disease, stage 3 (moderate): Secondary | ICD-10-CM | POA: Diagnosis not present

## 2017-02-14 DIAGNOSIS — C7931 Secondary malignant neoplasm of brain: Secondary | ICD-10-CM | POA: Diagnosis not present

## 2017-02-14 DIAGNOSIS — C787 Secondary malignant neoplasm of liver and intrahepatic bile duct: Secondary | ICD-10-CM | POA: Diagnosis not present

## 2017-02-14 DIAGNOSIS — G8252 Quadriplegia, C1-C4 incomplete: Secondary | ICD-10-CM | POA: Diagnosis not present

## 2017-02-14 DIAGNOSIS — E1122 Type 2 diabetes mellitus with diabetic chronic kidney disease: Secondary | ICD-10-CM | POA: Diagnosis not present

## 2017-02-14 DIAGNOSIS — S14104S Unspecified injury at C4 level of cervical spinal cord, sequela: Secondary | ICD-10-CM | POA: Diagnosis not present

## 2017-02-16 ENCOUNTER — Inpatient Hospital Stay (HOSPITAL_BASED_OUTPATIENT_CLINIC_OR_DEPARTMENT_OTHER): Payer: Medicare HMO | Admitting: Oncology

## 2017-02-16 ENCOUNTER — Inpatient Hospital Stay: Payer: Medicare HMO

## 2017-02-16 VITALS — BP 116/63 | HR 72 | Temp 96.5°F | Resp 18 | Wt 134.7 lb

## 2017-02-16 DIAGNOSIS — Z7982 Long term (current) use of aspirin: Secondary | ICD-10-CM | POA: Diagnosis not present

## 2017-02-16 DIAGNOSIS — C7931 Secondary malignant neoplasm of brain: Secondary | ICD-10-CM

## 2017-02-16 DIAGNOSIS — R5383 Other fatigue: Secondary | ICD-10-CM | POA: Diagnosis not present

## 2017-02-16 DIAGNOSIS — Z79899 Other long term (current) drug therapy: Secondary | ICD-10-CM | POA: Diagnosis not present

## 2017-02-16 DIAGNOSIS — C3490 Malignant neoplasm of unspecified part of unspecified bronchus or lung: Secondary | ICD-10-CM

## 2017-02-16 DIAGNOSIS — Z7689 Persons encountering health services in other specified circumstances: Secondary | ICD-10-CM | POA: Diagnosis not present

## 2017-02-16 DIAGNOSIS — Z87891 Personal history of nicotine dependence: Secondary | ICD-10-CM

## 2017-02-16 DIAGNOSIS — R531 Weakness: Secondary | ICD-10-CM

## 2017-02-16 DIAGNOSIS — C3491 Malignant neoplasm of unspecified part of right bronchus or lung: Secondary | ICD-10-CM

## 2017-02-16 DIAGNOSIS — E119 Type 2 diabetes mellitus without complications: Secondary | ICD-10-CM | POA: Diagnosis not present

## 2017-02-16 DIAGNOSIS — Z8041 Family history of malignant neoplasm of ovary: Secondary | ICD-10-CM

## 2017-02-16 DIAGNOSIS — I10 Essential (primary) hypertension: Secondary | ICD-10-CM | POA: Diagnosis not present

## 2017-02-16 DIAGNOSIS — C3411 Malignant neoplasm of upper lobe, right bronchus or lung: Secondary | ICD-10-CM | POA: Diagnosis not present

## 2017-02-16 DIAGNOSIS — Z5111 Encounter for antineoplastic chemotherapy: Secondary | ICD-10-CM | POA: Diagnosis not present

## 2017-02-16 LAB — COMPREHENSIVE METABOLIC PANEL
ALT: 19 U/L (ref 14–54)
ANION GAP: 8 (ref 5–15)
AST: 17 U/L (ref 15–41)
Albumin: 2.8 g/dL — ABNORMAL LOW (ref 3.5–5.0)
Alkaline Phosphatase: 116 U/L (ref 38–126)
BILIRUBIN TOTAL: 0.9 mg/dL (ref 0.3–1.2)
BUN: 18 mg/dL (ref 6–20)
CHLORIDE: 94 mmol/L — AB (ref 101–111)
CO2: 26 mmol/L (ref 22–32)
Calcium: 8 mg/dL — ABNORMAL LOW (ref 8.9–10.3)
Creatinine, Ser: 0.62 mg/dL (ref 0.44–1.00)
Glucose, Bld: 169 mg/dL — ABNORMAL HIGH (ref 65–99)
POTASSIUM: 3.7 mmol/L (ref 3.5–5.1)
Sodium: 128 mmol/L — ABNORMAL LOW (ref 135–145)
TOTAL PROTEIN: 5.5 g/dL — AB (ref 6.5–8.1)

## 2017-02-16 LAB — CBC WITH DIFFERENTIAL/PLATELET
Basophils Absolute: 0 10*3/uL (ref 0–0.1)
Basophils Relative: 1 %
EOS PCT: 1 %
Eosinophils Absolute: 0.1 10*3/uL (ref 0–0.7)
HEMATOCRIT: 32.9 % — AB (ref 35.0–47.0)
Hemoglobin: 11.4 g/dL — ABNORMAL LOW (ref 12.0–16.0)
LYMPHS ABS: 0.8 10*3/uL — AB (ref 1.0–3.6)
LYMPHS PCT: 18 %
MCH: 30.4 pg (ref 26.0–34.0)
MCHC: 34.6 g/dL (ref 32.0–36.0)
MCV: 87.8 fL (ref 80.0–100.0)
Monocytes Absolute: 0.1 10*3/uL — ABNORMAL LOW (ref 0.2–0.9)
Monocytes Relative: 1 %
NEUTROS ABS: 3.5 10*3/uL (ref 1.4–6.5)
Neutrophils Relative %: 79 %
PLATELETS: 205 10*3/uL (ref 150–440)
RBC: 3.75 MIL/uL — AB (ref 3.80–5.20)
RDW: 15.5 % — ABNORMAL HIGH (ref 11.5–14.5)
WBC: 4.4 10*3/uL (ref 3.6–11.0)

## 2017-02-16 NOTE — Progress Notes (Signed)
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   INTERVAL HISTORY: Patient returns to clinic today for further evaluation and to assess her toleration of cycle 1 of 4 of carboplatinum and etoposide. She tolerated her treatment well without significant side effects. She had some mild nausea after treatment, but this has resolved. She currently feels well and is asymptomatic. She has no new neurologic complaints. She denies any recent fevers or illnesses. She has a fair appetite, but denies weight loss. She has no chest pain, shortness of breath, cough, or hemoptysis. She denies any nausea, vomiting, constipation, or diarrhea. She has no urinary complaints. Patient offers no further specific complaints today.  REVIEW OF SYSTEMS:   Review of Systems  Constitutional: Positive for malaise/fatigue. Negative for fever and weight loss.  Respiratory: Negative.  Negative for cough, hemoptysis and shortness of breath.   Cardiovascular: Negative.  Negative for chest pain and leg swelling.  Gastrointestinal: Negative.  Negative for abdominal pain.  Genitourinary: Negative.   Musculoskeletal: Negative.   Neurological: Positive for weakness. Negative for dizziness, sensory change, focal weakness, seizures and headaches.  Psychiatric/Behavioral: Negative.  The patient is not nervous/anxious.     As per HPI. Otherwise, a complete review of systems is negative.  PAST MEDICAL HISTORY: Past Medical History:  Diagnosis Date  . Brain cancer ([place])   . Diabetes mellitus without complication ([place])   . Hypertension   . Lung cancer ([place])     PAST SURGICAL HISTORY: Past Surgical History:  Procedure  Laterality Date  . ABDOMINAL HYSTERECTOMY     partial  . APPENDECTOMY    . PORTA CATH INSERTION N/A 02/04/2017   Procedure: [name];  Surgeon: Algernon Huxley, MD;  Location: [facility];  Service: Cardiovascular;  Laterality: N/A;    FAMILY HISTORY: Family History  Problem Relation Age of Onset  . Diabetes Mother   . Melanoma Sister   . Ovarian cancer Sister   . Diabetes Sister   . Diabetes Brother   . Diabetes Maternal Grandmother   . Diabetes Paternal Grandmother     ADVANCED DIRECTIVES (Y/N):  N  HEALTH MAINTENANCE: Social History  Substance Use Topics  . Smoking status: Former Research scientist (life sciences)  . Smokeless tobacco: Never Used  . Alcohol use No     Colonoscopy:  PAP:  Bone density:  Lipid panel:  Allergies  Allergen Reactions  . Ace Inhibitors Anaphylaxis  . Allopurinol Anaphylaxis  . Angiotensin Receptor Blockers Anaphylaxis  . Codeine Anaphylaxis  . Enalapril Swelling    Other reaction(s): SWELLING/EDEMA  . Furosemide Swelling    Other reaction(s): OTHER  . Hydralazine Swelling    Other reaction(s): SWELLING/EDEMA  . Hydrochlorothiazide Anaphylaxis  . Irbesartan Swelling    Other reaction(s): SWELLING/EDEMA  . Levofloxacin Swelling    Other reaction(s): SWELLING/EDEMA  . Lidocaine Swelling    Other reaction(s): SWELLING/EDEMA  . Naproxen Sodium Anaphylaxis  . Spironolactone Anaphylaxis  . Tramadol Anaphylaxis    Other reaction(s): NAUSEA    Current Outpatient Prescriptions  Medication Sig Dispense Refill  . acetaminophen (TYLENOL) 500 MG tablet Take 500 mg by mouth 2 (two) times daily as needed for moderate pain.    Marland Kitchen alendronate (FOSAMAX) 70 MG  tablet Take 70 mg by mouth every Friday.     Marland Kitchen amLODipine (NORVASC) 10 MG tablet Take 10 mg by mouth at bedtime.     Marland Kitchen aspirin EC 81 MG tablet Take 81 mg by mouth daily.     . Calcium Carb-Cholecalciferol 600-800 MG-UNIT TABS Take 1 tablet by mouth daily.     . carvedilol (COREG) 25 MG tablet  Take 50 mg by mouth 2 (two) times daily with a meal.     . cetirizine (ZYRTEC) 10 MG tablet Take 10 mg by mouth at bedtime.     . cholecalciferol (VITAMIN D) 400 units TABS tablet Take 400 Units by mouth daily.    . cloNIDine (CATAPRES - DOSED IN MG/24 HR) 0.3 mg/24hr patch Place 0.3 mg onto the skin once a week. Change patch on Wednesdays    . colchicine-probenecid 0.5-500 MG tablet Take 1 tablet by mouth 2 (two) times daily.     Marland Kitchen docusate sodium (COLACE) 100 MG capsule Take 400 mg by mouth at bedtime.     Marland Kitchen erythromycin ophthalmic ointment Apply to eyelid margins every night    . esomeprazole (NEXIUM) 40 MG capsule Take 40 mg by mouth daily.     Marland Kitchen gabapentin (NEURONTIN) 300 MG capsule Take 300 mg by mouth 3 (three) times daily.     Marland Kitchen glucose blood (ACCU-CHEK COMPACT STRIPS) test strip Test daily before all meals/snacks and once before bedtime.    . Hypromellose (ARTIFICIAL TEARS OP) Apply 1 drop to eye daily as needed (dry eyes).    . Melatonin 10 MG TABS Take 20 mg by mouth at bedtime.    . Multiple Vitamins-Minerals (PRESERVISION AREDS 2 PO) Take 1 tablet by mouth 2 (two) times daily.    . nicotine (NICODERM CQ - DOSED IN MG/24 HOURS) 14 mg/24hr patch Place 14 mg onto the skin daily.     . ondansetron (ZOFRAN) 8 MG tablet Take 1 tablet (8 mg total) by mouth 2 (two) times daily as needed for refractory nausea / vomiting. 60 tablet 2  . prochlorperazine (COMPAZINE) 10 MG tablet Take 1 tablet (10 mg total) by mouth every 6 (six) hours as needed (Nausea or vomiting). 60 tablet 2  . simvastatin (ZOCOR) 20 MG tablet Take 20 mg by mouth every evening.     . traZODone (DESYREL) 50 MG tablet Take 50 mg by mouth at bedtime as needed for sleep.      No current facility-administered medications for this visit.     OBJECTIVE: Vitals:   02/16/17 1157  BP: 116/63  Pulse: 72  Resp: 18  Temp: (!) 96.5 F (35.8 C)     Body mass index is 22.76 kg/m.    ECOG FS:1 - Symptomatic but completely  ambulatory  General: Well-developed, well-nourished, no acute distress. Eyes: Pink conjunctiva, anicteric sclera. Lungs: Clear to auscultation bilaterally. Heart: Regular rate and rhythm. No rubs, murmurs, or gallops. Abdomen: Soft, nontender, nondistended. No organomegaly noted, normoactive bowel sounds. Musculoskeletal: No edema, cyanosis, or clubbing. Neuro: Alert, answering all questions appropriately. Cranial nerves grossly intact. Skin: No rashes or petechiae noted. Psych: Normal affect.   LAB RESULTS:  Lab Results  Component Value Date   NA 128 (L) 02/16/2017   K 3.7 02/16/2017   CL 94 (L) 02/16/2017   CO2 26 02/16/2017   GLUCOSE 169 (H) 02/16/2017   BUN 18 02/16/2017   CREATININE 0.62 02/16/2017   CALCIUM 8.0 (L) 02/16/2017   PROT 5.5 (L) 02/16/2017   ALBUMIN 2.8 (  L) 02/16/2017   AST 17 02/16/2017   ALT 19 02/16/2017   ALKPHOS 116 02/16/2017   BILITOT 0.9 02/16/2017   GFRNONAA >60 02/16/2017   GFRAA >60 02/16/2017    Lab Results  Component Value Date   WBC 4.4 02/16/2017   NEUTROABS 3.5 02/16/2017   HGB 11.4 (L) 02/16/2017   HCT 32.9 (L) 02/16/2017   MCV 87.8 02/16/2017   PLT 205 02/16/2017     STUDIES: No results found.  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   PLAN:    1. 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 No bone, hepatic, or other suspicious lesions were noted outside the brain. Primary lesion appears to be a 1.8 cm right upper lobe nodule. Patient was noted to have a 1.6 cm left hepatic lesion, but this was indeterminate and possibly a hemangioma. Liver lesion was PET negative. Patient completed XRT her brain. Patient tolerated cycle 1 of carboplatinum and  etoposide last week without significant side effects she also received etoposide on days 1 and day 2. Patient will require Neulasta support with each treatment. Plan to reimage after approximately 4 cycles. Return to clinic in 2 weeks for consideration of cycle 2.  2. Metastatic brain lesion: MRI the brain completed at [facility] on January 01, 2017 revealed a 4.5 x 3.4 x 4.3 lesion with rim enhancement in the right frontal lobe. Patient has completed XRT and Decadron taper with significant improvement of her left sided weakness.                                                                                                                                                    Patient expressed understanding and was in agreement with this plan. She also understands that She can call clinic at any time with any questions, concerns, or complaints.   Cancer Staging Small cell lung cancer [facility]) Staging form: Lung, AJCC 8th Edition - Clinical stage from 01/31/2017: Stage IVB (cT1b, cN2, cM1c) - Signed by Lloyd Huger, MD on 01/31/2017   Lloyd Huger, MD   02/17/2017 10:41 PM

## 2017-02-16 NOTE — Progress Notes (Signed)
Complains of feeling nauseated today.

## 2017-02-21 DIAGNOSIS — C7931 Secondary malignant neoplasm of brain: Secondary | ICD-10-CM | POA: Diagnosis not present

## 2017-02-21 DIAGNOSIS — C7951 Secondary malignant neoplasm of bone: Secondary | ICD-10-CM | POA: Diagnosis not present

## 2017-02-21 DIAGNOSIS — I129 Hypertensive chronic kidney disease with stage 1 through stage 4 chronic kidney disease, or unspecified chronic kidney disease: Secondary | ICD-10-CM | POA: Diagnosis not present

## 2017-02-21 DIAGNOSIS — E1122 Type 2 diabetes mellitus with diabetic chronic kidney disease: Secondary | ICD-10-CM | POA: Diagnosis not present

## 2017-02-21 DIAGNOSIS — C3490 Malignant neoplasm of unspecified part of unspecified bronchus or lung: Secondary | ICD-10-CM | POA: Diagnosis not present

## 2017-02-21 DIAGNOSIS — C787 Secondary malignant neoplasm of liver and intrahepatic bile duct: Secondary | ICD-10-CM | POA: Diagnosis not present

## 2017-02-21 DIAGNOSIS — G8252 Quadriplegia, C1-C4 incomplete: Secondary | ICD-10-CM | POA: Diagnosis not present

## 2017-02-21 DIAGNOSIS — S14104S Unspecified injury at C4 level of cervical spinal cord, sequela: Secondary | ICD-10-CM | POA: Diagnosis not present

## 2017-02-21 DIAGNOSIS — N183 Chronic kidney disease, stage 3 (moderate): Secondary | ICD-10-CM | POA: Diagnosis not present

## 2017-02-23 DIAGNOSIS — C7951 Secondary malignant neoplasm of bone: Secondary | ICD-10-CM | POA: Diagnosis not present

## 2017-02-23 DIAGNOSIS — I129 Hypertensive chronic kidney disease with stage 1 through stage 4 chronic kidney disease, or unspecified chronic kidney disease: Secondary | ICD-10-CM | POA: Diagnosis not present

## 2017-02-23 DIAGNOSIS — C787 Secondary malignant neoplasm of liver and intrahepatic bile duct: Secondary | ICD-10-CM | POA: Diagnosis not present

## 2017-02-23 DIAGNOSIS — E1122 Type 2 diabetes mellitus with diabetic chronic kidney disease: Secondary | ICD-10-CM | POA: Diagnosis not present

## 2017-02-23 DIAGNOSIS — G8252 Quadriplegia, C1-C4 incomplete: Secondary | ICD-10-CM | POA: Diagnosis not present

## 2017-02-23 DIAGNOSIS — C7931 Secondary malignant neoplasm of brain: Secondary | ICD-10-CM | POA: Diagnosis not present

## 2017-02-23 DIAGNOSIS — C3490 Malignant neoplasm of unspecified part of unspecified bronchus or lung: Secondary | ICD-10-CM | POA: Diagnosis not present

## 2017-02-23 DIAGNOSIS — N183 Chronic kidney disease, stage 3 (moderate): Secondary | ICD-10-CM | POA: Diagnosis not present

## 2017-02-23 DIAGNOSIS — S14104S Unspecified injury at C4 level of cervical spinal cord, sequela: Secondary | ICD-10-CM | POA: Diagnosis not present

## 2017-02-25 DIAGNOSIS — C7951 Secondary malignant neoplasm of bone: Secondary | ICD-10-CM | POA: Diagnosis not present

## 2017-02-25 DIAGNOSIS — E1122 Type 2 diabetes mellitus with diabetic chronic kidney disease: Secondary | ICD-10-CM | POA: Diagnosis not present

## 2017-02-25 DIAGNOSIS — C787 Secondary malignant neoplasm of liver and intrahepatic bile duct: Secondary | ICD-10-CM | POA: Diagnosis not present

## 2017-02-25 DIAGNOSIS — S14104S Unspecified injury at C4 level of cervical spinal cord, sequela: Secondary | ICD-10-CM | POA: Diagnosis not present

## 2017-02-25 DIAGNOSIS — C7931 Secondary malignant neoplasm of brain: Secondary | ICD-10-CM | POA: Diagnosis not present

## 2017-02-25 DIAGNOSIS — I129 Hypertensive chronic kidney disease with stage 1 through stage 4 chronic kidney disease, or unspecified chronic kidney disease: Secondary | ICD-10-CM | POA: Diagnosis not present

## 2017-02-25 DIAGNOSIS — C3490 Malignant neoplasm of unspecified part of unspecified bronchus or lung: Secondary | ICD-10-CM | POA: Diagnosis not present

## 2017-02-25 DIAGNOSIS — G8252 Quadriplegia, C1-C4 incomplete: Secondary | ICD-10-CM | POA: Diagnosis not present

## 2017-02-25 DIAGNOSIS — N183 Chronic kidney disease, stage 3 (moderate): Secondary | ICD-10-CM | POA: Diagnosis not present

## 2017-02-26 ENCOUNTER — Ambulatory Visit
Admission: RE | Admit: 2017-02-26 | Discharge: 2017-02-26 | Disposition: A | Discharge status: Home / Self Care | Payer: Medicare HMO | Source: Ambulatory Visit | Attending: Oncology | Admitting: Oncology

## 2017-02-26 ENCOUNTER — Telehealth: Payer: Self-pay | Admitting: *Deleted

## 2017-02-26 DIAGNOSIS — C3490 Malignant neoplasm of unspecified part of unspecified bronchus or lung: Secondary | ICD-10-CM

## 2017-02-26 DIAGNOSIS — I739 Peripheral vascular disease, unspecified: Secondary | ICD-10-CM | POA: Insufficient documentation

## 2017-02-26 DIAGNOSIS — R569 Unspecified convulsions: Secondary | ICD-10-CM | POA: Insufficient documentation

## 2017-02-26 DIAGNOSIS — C7931 Secondary malignant neoplasm of brain: Secondary | ICD-10-CM | POA: Insufficient documentation

## 2017-02-26 DIAGNOSIS — R600 Localized edema: Secondary | ICD-10-CM | POA: Insufficient documentation

## 2017-02-26 MED ORDER — GADOBENATE DIMEGLUMINE 529 MG/ML IV SOLN
15.0000 mL | Freq: Once | INTRAVENOUS | Status: AC | PRN
  Administered 2017-02-26: 12 mL via INTRAVENOUS

## 2017-02-26 NOTE — Telephone Encounter (Signed)
Per Dr Grayland Ormond, LLE Doppler STAT for possible DVT and MRI Brain with and without contrast STAT. Orders entered scheduling message entered and followed by a call to them and daughter was notified of impending appt for today and is arranging to get off of work to go home and get her for appts.

## 2017-02-26 NOTE — Telephone Encounter (Signed)
Since Saturday is swelling in left leg, knee down and has little red spots that have now developed. Not hot to touch, red or painful. Denies homans sign per PT evaluation. Also having intermittent swelling in th left hand. She does have slight swelling on the right leg, but not bad. She had what appeared to have a seizure Sunday on the left side 3 weeks ago. Possible seizure this past Tuesday during the night. Please advise

## 2017-02-27 DIAGNOSIS — C7951 Secondary malignant neoplasm of bone: Secondary | ICD-10-CM | POA: Diagnosis not present

## 2017-02-27 DIAGNOSIS — S14104S Unspecified injury at C4 level of cervical spinal cord, sequela: Secondary | ICD-10-CM | POA: Diagnosis not present

## 2017-02-27 DIAGNOSIS — C787 Secondary malignant neoplasm of liver and intrahepatic bile duct: Secondary | ICD-10-CM | POA: Diagnosis not present

## 2017-02-27 DIAGNOSIS — C3490 Malignant neoplasm of unspecified part of unspecified bronchus or lung: Secondary | ICD-10-CM | POA: Diagnosis not present

## 2017-02-27 DIAGNOSIS — N183 Chronic kidney disease, stage 3 (moderate): Secondary | ICD-10-CM | POA: Diagnosis not present

## 2017-02-27 DIAGNOSIS — C7931 Secondary malignant neoplasm of brain: Secondary | ICD-10-CM | POA: Diagnosis not present

## 2017-02-27 DIAGNOSIS — I129 Hypertensive chronic kidney disease with stage 1 through stage 4 chronic kidney disease, or unspecified chronic kidney disease: Secondary | ICD-10-CM | POA: Diagnosis not present

## 2017-02-27 DIAGNOSIS — G8252 Quadriplegia, C1-C4 incomplete: Secondary | ICD-10-CM | POA: Diagnosis not present

## 2017-02-27 DIAGNOSIS — E1122 Type 2 diabetes mellitus with diabetic chronic kidney disease: Secondary | ICD-10-CM | POA: Diagnosis not present

## 2017-03-01 NOTE — Progress Notes (Signed)
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   INTERVAL HISTORY: Patient returns to clinic today for further evaluation and consideration of cycle 2 of 4 of carboplatinum and etoposide. She had several episodes last week where she thought she had a "seizure" but was unsure. She also had increased edema in her lower extremities but ultrasound Doppler was negative. She currently feels well and is back to her baseline. She has no further neurologic complaints. She denies any recent fevers or illnesses. She has a fair appetite, but denies weight loss. She has no chest pain, shortness of breath, cough, or hemoptysis. She denies any nausea, vomiting, constipation, or diarrhea. She has no urinary complaints. Patient offers no further specific complaints today.  REVIEW OF SYSTEMS:   Review of Systems  Constitutional: Positive for malaise/fatigue. Negative for fever and weight loss.  Respiratory: Negative.  Negative for cough, hemoptysis and shortness of breath.   Cardiovascular: Negative.  Negative for chest pain and leg swelling.  Gastrointestinal: Negative.  Negative for abdominal pain.  Genitourinary: Negative.   Musculoskeletal: Negative.   Neurological: Positive for focal weakness, seizures and weakness. Negative for dizziness, sensory change and headaches.  Psychiatric/Behavioral: Negative.  The patient is not nervous/anxious.     As per HPI. Otherwise, a complete review of systems is negative.  PAST MEDICAL HISTORY: Past Medical History:  Diagnosis Date  . Brain cancer ([place])   . Diabetes mellitus without complication ([place])   . Hypertension   . Lung cancer ([place])     PAST SURGICAL  HISTORY: Past Surgical History:  Procedure Laterality Date  . ABDOMINAL HYSTERECTOMY     partial  . APPENDECTOMY    . PORTA CATH INSERTION N/A 02/04/2017   Procedure: [name];  Surgeon: Algernon Huxley, MD;  Location: [place] [facility];  Service: Cardiovascular;  Laterality: N/A;    FAMILY HISTORY: Family History  Problem Relation Age of Onset  . Diabetes Mother   . Melanoma Sister   . Ovarian cancer Sister   . Diabetes Sister   . Diabetes Brother   . Diabetes Maternal Grandmother   . Diabetes Paternal Grandmother     ADVANCED DIRECTIVES (Y/N):  N  HEALTH MAINTENANCE: Social History  Substance Use Topics  . Smoking status: Former Research scientist (life sciences)  . Smokeless tobacco: Never Used  . Alcohol use No     Colonoscopy:  PAP:  Bone density:  Lipid panel:  Allergies  Allergen Reactions  . Ace Inhibitors Anaphylaxis  . Allopurinol Anaphylaxis  . Angiotensin Receptor Blockers Anaphylaxis  . Codeine Anaphylaxis  . Enalapril Swelling    Other reaction(s): SWELLING/EDEMA  . Furosemide Swelling    Other reaction(s): OTHER  . Hydralazine Swelling    Other reaction(s): SWELLING/EDEMA  . Hydrochlorothiazide Anaphylaxis  . Irbesartan Swelling    Other reaction(s): SWELLING/EDEMA  . Levofloxacin Swelling    Other reaction(s): SWELLING/EDEMA  . Lidocaine Swelling    Other reaction(s): SWELLING/EDEMA  . Naproxen Sodium Anaphylaxis  . Spironolactone Anaphylaxis  . Tramadol Anaphylaxis    Other reaction(s): NAUSEA    Current Outpatient Prescriptions  Medication Sig Dispense Refill  . acetaminophen (TYLENOL) 500 MG tablet Take 500 mg by mouth 2 (two) times daily as needed  for moderate pain.    Marland Kitchen alendronate (FOSAMAX) 70 MG tablet Take 70 mg by mouth every Friday.     Marland Kitchen amLODipine (NORVASC) 10 MG tablet Take 10 mg by mouth at bedtime.     Marland Kitchen aspirin EC 81 MG tablet Take 81 mg by mouth daily.     . Calcium Carb-Cholecalciferol 600-800 MG-UNIT TABS Take 1 tablet by mouth  daily.     . cetirizine (ZYRTEC) 10 MG tablet Take 10 mg by mouth at bedtime.     . cholecalciferol (VITAMIN D) 400 units TABS tablet Take 400 Units by mouth daily.    . colchicine-probenecid 0.5-500 MG tablet Take 1 tablet by mouth 2 (two) times daily.     Marland Kitchen dexamethasone (DECADRON) 4 MG tablet Take 1 tablet (4 mg total) by mouth daily. 30 tablet 1  . docusate sodium (COLACE) 100 MG capsule Take 400 mg by mouth at bedtime.     Marland Kitchen erythromycin ophthalmic ointment Apply to eyelid margins every night    . esomeprazole (NEXIUM) 40 MG capsule Take 40 mg by mouth daily.     Marland Kitchen gabapentin (NEURONTIN) 300 MG capsule Take 300 mg by mouth.    Marland Kitchen glucose blood (ACCU-CHEK COMPACT STRIPS) test strip Test daily before all meals/snacks and once before bedtime.    . Hypromellose (ARTIFICIAL TEARS OP) Apply 1 drop to eye daily as needed (dry eyes).    . Melatonin 10 MG TABS Take 20 mg by mouth at bedtime.    . Multiple Vitamins-Minerals (PRESERVISION AREDS 2 PO) Take 1 tablet by mouth 2 (two) times daily.    . simvastatin (ZOCOR) 20 MG tablet Take 20 mg by mouth every evening.     . carvedilol (COREG) 25 MG tablet Take 50 mg by mouth 2 (two) times daily with a meal.     . cloNIDine (CATAPRES - DOSED IN MG/24 HR) 0.3 mg/24hr patch Place 0.3 mg onto the skin once a week. Change patch on Wednesdays    . gabapentin (NEURONTIN) 300 MG capsule Take 300 mg by mouth 3 (three) times daily.     . ondansetron (ZOFRAN) 8 MG tablet Take 1 tablet (8 mg total) by mouth 2 (two) times daily as needed for refractory nausea / vomiting. (Patient not taking: Reported on 03/02/2017) 60 tablet 2  . prochlorperazine (COMPAZINE) 10 MG tablet Take 1 tablet (10 mg total) by mouth every 6 (six) hours as needed (Nausea or vomiting). (Patient not taking: Reported on 03/02/2017) 60 tablet 2  . traZODone (DESYREL) 50 MG tablet Take 50 mg by mouth at bedtime as needed for sleep.      No current facility-administered medications for this visit.      OBJECTIVE: Vitals:   03/02/17 1226  BP: 120/67  Pulse: 73  Temp: 97 F (36.1 C)     Body mass index is 23.64 kg/m.    ECOG FS:1 - Symptomatic but completely ambulatory  General: Well-developed, well-nourished, no acute distress. Eyes: Pink conjunctiva, anicteric sclera. Lungs: Clear to auscultation bilaterally. Heart: Regular rate and rhythm. No rubs, murmurs, or gallops. Abdomen: Soft, nontender, nondistended. No organomegaly noted, normoactive bowel sounds. Musculoskeletal: No edema, cyanosis, or clubbing. Neuro: Alert, answering all questions appropriately. Cranial nerves grossly intact. Skin: No rashes or petechiae noted. Psych: Normal affect.   LAB RESULTS:  Lab Results  Component Value Date   NA 132 (L) 03/02/2017   K 3.7 03/02/2017   CL 95 (L) 03/02/2017   CO2 27 03/02/2017   GLUCOSE  169 (H) 03/02/2017   BUN 15 03/02/2017   CREATININE 0.92 03/02/2017   CALCIUM 7.8 (L) 03/02/2017   PROT 6.3 (L) 03/02/2017   ALBUMIN 2.8 (L) 03/02/2017   AST 21 03/02/2017   ALT 19 03/02/2017   ALKPHOS 134 (H) 03/02/2017   BILITOT 0.5 03/02/2017   GFRNONAA 58 (L) 03/02/2017   GFRAA >60 03/02/2017    Lab Results  Component Value Date   WBC 25.4 (H) 03/02/2017   NEUTROABS 21.0 (H) 03/02/2017   HGB 11.4 (L) 03/02/2017   HCT 33.2 (L) 03/02/2017   MCV 87.5 03/02/2017   PLT 489 (H) 03/02/2017     STUDIES: Mr Jeri Cos NW Contrast  Result Date: 02/26/2017 CLINICAL DATA:  76 year old female with 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 Small cell lung cancer. Seizure like episodes. EXAM: MRI HEAD WITHOUT AND WITH CONTRAST TECHNIQUE: Multiplanar, multiecho pulse sequences of the brain and surrounding structures were obtained without and with intravenous contrast. CONTRAST:  79m MULTIHANCE GADOBENATE DIMEGLUMINE 529 MG/ML IV SOLN COMPARISON:  No recent study for comparison. Neck CT without contrast 07/20/2011. Head CT without contrast 11/14/2008. FINDINGS: Brain: Mildly lobulated 3  cm irregular rim enhancing and partially hemorrhagic mass in the right superior frontal gyrus. This appears to be centered in the pre motor cortex. Heterogeneous restricted diffusion within the mass such as can be seen following radiosurgery treatment. Surrounding vasogenic edema. But overall mild regional mass effect. Wrap less pronounced wrap artifact on coronal postcontrast images. Artifact in the right occipital lobe on postcontrast sagittal series 14, image 10. No other brain metastasis identified.  No dural thickening. No restricted diffusion to suggest acute infarction. No midline shift, ventriculomegaly, extra-axial collection or acute intracranial hemorrhage. Cervicomedullary junction and pituitary are within normal limits. Patchy and confluent bilateral cerebral white matter T2 and FLAIR hyperintensity. Small chronic lacune in the right caudate nucleus. Mild patchy T2 hyperintensity in the pons. Vascular: Major intracranial vascular flow voids are preserved, with dominant appearing distal right vertebral artery. Skull and upper cervical spine: Severe degenerative cervical spinal stenosis suspected at C3-C4 (series 2, image 13) with moderate to severe spinal cord mass effect. Visible bone marrow signal is within normal limits. Sinuses/Orbits: Negative; postoperative changes to both globes. Other: Mastoids are clear. Visible internal auditory structures appear normal. Negative scalp soft tissues. IMPRESSION: 1. Right superior frontal gyrus 3 cm metastasis with surrounding vasogenic edema but mild regional mass effect. Signal changes within the lesion suggest this may have been previously radiated. 2. No other metastatic disease to the brain identified. 3. 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 4. No other acute intracranial abnormality. Chronic small vessel disease. Electronically Signed   By: HGenevie AnnM.D.   On: 02/26/2017 15:52   [place]oreaVenous Img Lower Unilateral Left  Result  Date: 02/26/2017 CLINICAL DATA:  Left leg edema.  Small-cell lung cancer. EXAM: LEFT LOWER EXTREMITY VENOUS DOPPLER ULTRASOUND TECHNIQUE: Gray-scale sonography with graded compression, as well as color Doppler and duplex ultrasound were performed to evaluate the lower extremity deep venous systems from the level of the common femoral vein and including the common femoral, femoral, profunda femoral, popliteal and calf veins including the posterior tibial, peroneal and gastrocnemius veins when visible. The superficial great saphenous vein was also interrogated. Spectral Doppler was utilized to evaluate flow at rest and with distal augmentation maneuvers in the common femoral, femoral and popliteal veins. COMPARISON:  No recent. FINDINGS: Contralateral Common Femoral Vein: Respiratory phasicity is normal and symmetric with the symptomatic  side. No evidence of thrombus. Normal compressibility. Common Femoral Vein: No evidence of thrombus. Normal compressibility, respiratory phasicity and response to augmentation. Saphenofemoral Junction: No evidence of thrombus. Normal compressibility and flow on color Doppler imaging. Profunda Femoral Vein: No evidence of thrombus. Normal compressibility and flow on color Doppler imaging. Femoral Vein: No evidence of thrombus. Normal compressibility, respiratory phasicity and response to augmentation. Popliteal Vein: No evidence of thrombus. Normal compressibility, respiratory phasicity and response to augmentation. Calf Veins: No evidence of thrombus. Normal compressibility and flow on color Doppler imaging. Superficial Great Saphenous Vein: No evidence of thrombus. Normal compressibility and flow on color Doppler imaging. Other Findings:  None. IMPRESSION: No evidence of DVT left lower extremity. 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   PLAN:    1. 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 No bone, hepatic, or other suspicious lesions were noted outside the brain. Primary lesion appears to be a 1.8 cm right upper lobe nodule. Patient was noted to have a 1.6 cm left hepatic lesion, but this was indeterminate and possibly a hemangioma. Liver lesion was PET negative. Patient completed XRT her brain. Proceed with cycle 2 of carboplatinum and etoposide today. She also receives etoposide on days 2 and 3. Patient will require Neulasta support with each treatment. Plan to reimage after approximately 4 cycles. Return to clinic in 3 weeks for consideration of cycle 3.  2. Metastatic brain lesion: MRI the brain completed at [facility] on January 01, 2017 revealed a 4.5 x 3.4 x 4.3 lesion with rim enhancement in the right frontal lobe. Patient has completed XRT and Decadron taper with significant improvement of her left sided weakness. 3. Seizures: Unwitnessed. Patient had MRI of the brain on Friday which was reviewed independently and reported as above. She has no new obvious metastatic lesions, but patient continues to have vasogenic edema. She was placed on 4 mg oral Decadron daily. 4. Lower extremity edema: Ultrasound Doppler negative for DVT. Monitor.                                                                                                                                                    Patient expressed understanding and was in agreement with this plan. She also understands that She can call clinic at any time with any questions, concerns, or complaints.   Cancer Staging Small cell lung cancer [facility] Staging form: Lung, AJCC 8th Edition - Clinical stage from 01/31/2017: Stage IVB (cT1b, cN2, cM1c) - Signed by Grayland Ormond,  Kathlene November, MD  on 01/31/2017   Lloyd Huger, MD   03/02/2017 2:37 PM

## 2017-03-02 ENCOUNTER — Inpatient Hospital Stay (HOSPITAL_BASED_OUTPATIENT_CLINIC_OR_DEPARTMENT_OTHER): Payer: Medicare HMO | Admitting: Oncology

## 2017-03-02 ENCOUNTER — Inpatient Hospital Stay: Payer: Medicare HMO

## 2017-03-02 ENCOUNTER — Encounter: Payer: Self-pay | Admitting: Oncology

## 2017-03-02 ENCOUNTER — Inpatient Hospital Stay: Payer: Medicare HMO | Attending: Oncology

## 2017-03-02 VITALS — BP 157/68 | HR 69 | Temp 96.0°F | Resp 18

## 2017-03-02 VITALS — BP 120/67 | HR 73 | Temp 97.0°F | Wt 139.9 lb

## 2017-03-02 DIAGNOSIS — C3491 Malignant neoplasm of unspecified part of right bronchus or lung: Secondary | ICD-10-CM

## 2017-03-02 DIAGNOSIS — R569 Unspecified convulsions: Secondary | ICD-10-CM

## 2017-03-02 DIAGNOSIS — R531 Weakness: Secondary | ICD-10-CM | POA: Insufficient documentation

## 2017-03-02 DIAGNOSIS — E119 Type 2 diabetes mellitus without complications: Secondary | ICD-10-CM | POA: Insufficient documentation

## 2017-03-02 DIAGNOSIS — I10 Essential (primary) hypertension: Secondary | ICD-10-CM

## 2017-03-02 DIAGNOSIS — R918 Other nonspecific abnormal finding of lung field: Secondary | ICD-10-CM

## 2017-03-02 DIAGNOSIS — R5383 Other fatigue: Secondary | ICD-10-CM | POA: Diagnosis not present

## 2017-03-02 DIAGNOSIS — R600 Localized edema: Secondary | ICD-10-CM | POA: Diagnosis not present

## 2017-03-02 DIAGNOSIS — Z8041 Family history of malignant neoplasm of ovary: Secondary | ICD-10-CM

## 2017-03-02 DIAGNOSIS — Z79899 Other long term (current) drug therapy: Secondary | ICD-10-CM | POA: Insufficient documentation

## 2017-03-02 DIAGNOSIS — C7931 Secondary malignant neoplasm of brain: Secondary | ICD-10-CM | POA: Diagnosis not present

## 2017-03-02 DIAGNOSIS — Z87891 Personal history of nicotine dependence: Secondary | ICD-10-CM | POA: Diagnosis not present

## 2017-03-02 DIAGNOSIS — C3490 Malignant neoplasm of unspecified part of unspecified bronchus or lung: Secondary | ICD-10-CM

## 2017-03-02 DIAGNOSIS — R05 Cough: Secondary | ICD-10-CM | POA: Diagnosis not present

## 2017-03-02 DIAGNOSIS — Z5111 Encounter for antineoplastic chemotherapy: Secondary | ICD-10-CM | POA: Diagnosis not present

## 2017-03-02 DIAGNOSIS — Z7982 Long term (current) use of aspirin: Secondary | ICD-10-CM | POA: Diagnosis not present

## 2017-03-02 DIAGNOSIS — D72829 Elevated white blood cell count, unspecified: Secondary | ICD-10-CM | POA: Diagnosis not present

## 2017-03-02 LAB — COMPREHENSIVE METABOLIC PANEL
ALBUMIN: 2.8 g/dL — AB (ref 3.5–5.0)
ALT: 19 U/L (ref 14–54)
ANION GAP: 10 (ref 5–15)
AST: 21 U/L (ref 15–41)
Alkaline Phosphatase: 134 U/L — ABNORMAL HIGH (ref 38–126)
BILIRUBIN TOTAL: 0.5 mg/dL (ref 0.3–1.2)
BUN: 15 mg/dL (ref 6–20)
CO2: 27 mmol/L (ref 22–32)
Calcium: 7.8 mg/dL — ABNORMAL LOW (ref 8.9–10.3)
Chloride: 95 mmol/L — ABNORMAL LOW (ref 101–111)
Creatinine, Ser: 0.92 mg/dL (ref 0.44–1.00)
GFR calc non Af Amer: 58 mL/min — ABNORMAL LOW (ref >60)
GLUCOSE: 169 mg/dL — AB (ref 65–99)
POTASSIUM: 3.7 mmol/L (ref 3.5–5.1)
Sodium: 132 mmol/L — ABNORMAL LOW (ref 135–145)
TOTAL PROTEIN: 6.3 g/dL — AB (ref 6.5–8.1)

## 2017-03-02 LAB — CBC WITH DIFFERENTIAL/PLATELET
BASOS ABS: 0.3 10*3/uL — AB (ref 0–0.1)
Basophils Relative: 1 %
Eosinophils Absolute: 0 10*3/uL (ref 0–0.7)
Eosinophils Relative: 0 %
HEMATOCRIT: 33.2 % — AB (ref 35.0–47.0)
Hemoglobin: 11.4 g/dL — ABNORMAL LOW (ref 12.0–16.0)
LYMPHS ABS: 1.8 10*3/uL (ref 1.0–3.6)
Lymphocytes Relative: 7 %
MCH: 30.1 pg (ref 26.0–34.0)
MCHC: 34.4 g/dL (ref 32.0–36.0)
MCV: 87.5 fL (ref 80.0–100.0)
MONO ABS: 2.3 10*3/uL — AB (ref 0.2–0.9)
Monocytes Relative: 9 %
Neutro Abs: 21 10*3/uL — ABNORMAL HIGH (ref 1.4–6.5)
Neutrophils Relative %: 83 %
Platelets: 489 10*3/uL — ABNORMAL HIGH (ref 150–440)
RBC: 3.8 MIL/uL (ref 3.80–5.20)
RDW: 16 % — AB (ref 11.5–14.5)
Smear Review: INCREASED
WBC: 25.4 10*3/uL — AB (ref 3.6–11.0)

## 2017-03-02 MED ORDER — SODIUM CHLORIDE 0.9 % IV SOLN
350.0000 mg | Freq: Once | INTRAVENOUS | Status: AC
  Administered 2017-03-02: 350 mg via INTRAVENOUS
  Filled 2017-03-02: qty 35

## 2017-03-02 MED ORDER — SODIUM CHLORIDE 0.9 % IV SOLN
Freq: Once | INTRAVENOUS | Status: AC
  Administered 2017-03-02: 11:00:00 via INTRAVENOUS
  Filled 2017-03-02: qty 1000

## 2017-03-02 MED ORDER — PALONOSETRON HCL INJECTION 0.25 MG/5ML
0.2500 mg | Freq: Once | INTRAVENOUS | Status: AC
  Administered 2017-03-02: 0.25 mg via INTRAVENOUS
  Filled 2017-03-02: qty 5

## 2017-03-02 MED ORDER — SODIUM CHLORIDE 0.9% FLUSH
10.0000 mL | Freq: Once | INTRAVENOUS | Status: AC
  Administered 2017-03-02: 10 mL via INTRAVENOUS
  Filled 2017-03-02: qty 10

## 2017-03-02 MED ORDER — SODIUM CHLORIDE 0.9 % IV SOLN
100.0000 mg/m2 | Freq: Once | INTRAVENOUS | Status: AC
  Administered 2017-03-02: 170 mg via INTRAVENOUS
  Filled 2017-03-02: qty 8.5

## 2017-03-02 MED ORDER — HEPARIN SOD (PORK) LOCK FLUSH 100 UNIT/ML IV SOLN
500.0000 [IU] | Freq: Once | INTRAVENOUS | Status: AC
  Administered 2017-03-02: 500 [IU] via INTRAVENOUS
  Filled 2017-03-02: qty 5

## 2017-03-02 MED ORDER — DEXAMETHASONE SODIUM PHOSPHATE 10 MG/ML IJ SOLN
10.0000 mg | Freq: Once | INTRAMUSCULAR | Status: AC
  Administered 2017-03-02: 10 mg via INTRAVENOUS
  Filled 2017-03-02: qty 1

## 2017-03-02 MED ORDER — SODIUM CHLORIDE 0.9 % IV SOLN: 10.0000 mg | Freq: Once | INTRAVENOUS | Status: DC

## 2017-03-02 MED ORDER — DEXAMETHASONE 4 MG PO TABS: 4.0000 mg | ORAL_TABLET | Freq: Every day | ORAL | 1 refills | Status: DC

## 2017-03-03 ENCOUNTER — Inpatient Hospital Stay: Payer: Medicare HMO

## 2017-03-03 VITALS — BP 119/74 | HR 78 | Temp 96.4°F | Resp 18

## 2017-03-03 DIAGNOSIS — R05 Cough: Secondary | ICD-10-CM | POA: Diagnosis not present

## 2017-03-03 DIAGNOSIS — C7931 Secondary malignant neoplasm of brain: Secondary | ICD-10-CM | POA: Diagnosis not present

## 2017-03-03 DIAGNOSIS — R5383 Other fatigue: Secondary | ICD-10-CM | POA: Diagnosis not present

## 2017-03-03 DIAGNOSIS — R569 Unspecified convulsions: Secondary | ICD-10-CM | POA: Diagnosis not present

## 2017-03-03 DIAGNOSIS — R600 Localized edema: Secondary | ICD-10-CM | POA: Diagnosis not present

## 2017-03-03 DIAGNOSIS — R918 Other nonspecific abnormal finding of lung field: Secondary | ICD-10-CM | POA: Diagnosis not present

## 2017-03-03 DIAGNOSIS — C3491 Malignant neoplasm of unspecified part of right bronchus or lung: Secondary | ICD-10-CM | POA: Diagnosis not present

## 2017-03-03 DIAGNOSIS — D72829 Elevated white blood cell count, unspecified: Secondary | ICD-10-CM | POA: Diagnosis not present

## 2017-03-03 DIAGNOSIS — Z5111 Encounter for antineoplastic chemotherapy: Secondary | ICD-10-CM | POA: Diagnosis not present

## 2017-03-03 MED ORDER — SODIUM CHLORIDE 0.9 % IV SOLN
Freq: Once | INTRAVENOUS | Status: AC
  Administered 2017-03-03: 14:00:00 via INTRAVENOUS
  Filled 2017-03-03: qty 1000

## 2017-03-03 MED ORDER — SODIUM CHLORIDE 0.9 % IV SOLN
100.0000 mg/m2 | Freq: Once | INTRAVENOUS | Status: AC
  Administered 2017-03-03: 170 mg via INTRAVENOUS
  Filled 2017-03-03: qty 8.5

## 2017-03-03 MED ORDER — SODIUM CHLORIDE 0.9% FLUSH
10.0000 mL | INTRAVENOUS | Status: DC | PRN
  Administered 2017-03-03: 10 mL
  Filled 2017-03-03: qty 10

## 2017-03-03 MED ORDER — DEXAMETHASONE SODIUM PHOSPHATE 10 MG/ML IJ SOLN
10.0000 mg | Freq: Once | INTRAMUSCULAR | Status: AC
  Administered 2017-03-03: 10 mg via INTRAVENOUS
  Filled 2017-03-03: qty 1

## 2017-03-03 MED ORDER — HEPARIN SOD (PORK) LOCK FLUSH 100 UNIT/ML IV SOLN
500.0000 [IU] | Freq: Once | INTRAVENOUS | Status: AC | PRN
  Administered 2017-03-03: 500 [IU]
  Filled 2017-03-03: qty 5

## 2017-03-04 ENCOUNTER — Inpatient Hospital Stay: Payer: Medicare HMO

## 2017-03-04 VITALS — BP 138/70 | HR 71 | Temp 96.4°F | Resp 20

## 2017-03-04 DIAGNOSIS — C3491 Malignant neoplasm of unspecified part of right bronchus or lung: Secondary | ICD-10-CM

## 2017-03-04 DIAGNOSIS — R05 Cough: Secondary | ICD-10-CM | POA: Diagnosis not present

## 2017-03-04 DIAGNOSIS — R600 Localized edema: Secondary | ICD-10-CM | POA: Diagnosis not present

## 2017-03-04 DIAGNOSIS — R5383 Other fatigue: Secondary | ICD-10-CM | POA: Diagnosis not present

## 2017-03-04 DIAGNOSIS — C7931 Secondary malignant neoplasm of brain: Secondary | ICD-10-CM | POA: Diagnosis not present

## 2017-03-04 DIAGNOSIS — Z5111 Encounter for antineoplastic chemotherapy: Secondary | ICD-10-CM | POA: Diagnosis not present

## 2017-03-04 DIAGNOSIS — R569 Unspecified convulsions: Secondary | ICD-10-CM | POA: Diagnosis not present

## 2017-03-04 DIAGNOSIS — D72829 Elevated white blood cell count, unspecified: Secondary | ICD-10-CM | POA: Diagnosis not present

## 2017-03-04 DIAGNOSIS — R918 Other nonspecific abnormal finding of lung field: Secondary | ICD-10-CM | POA: Diagnosis not present

## 2017-03-04 MED ORDER — SODIUM CHLORIDE 0.9 % IV SOLN
100.0000 mg/m2 | Freq: Once | INTRAVENOUS | Status: AC
  Administered 2017-03-04: 170 mg via INTRAVENOUS
  Filled 2017-03-04: qty 8.5

## 2017-03-04 MED ORDER — PEGFILGRASTIM 6 MG/0.6ML ~~LOC~~ PSKT
6.0000 mg | PREFILLED_SYRINGE | Freq: Once | SUBCUTANEOUS | Status: AC
  Administered 2017-03-04: 6 mg via SUBCUTANEOUS
  Filled 2017-03-04: qty 0.6

## 2017-03-04 MED ORDER — HEPARIN SOD (PORK) LOCK FLUSH 100 UNIT/ML IV SOLN
500.0000 [IU] | Freq: Once | INTRAVENOUS | Status: AC | PRN
Start: 2017-03-04 — End: 2017-03-04
  Administered 2017-03-04: 500 [IU]
  Filled 2017-03-04: qty 5

## 2017-03-04 MED ORDER — DEXAMETHASONE SODIUM PHOSPHATE 10 MG/ML IJ SOLN
10.0000 mg | Freq: Once | INTRAMUSCULAR | Status: AC
  Administered 2017-03-04: 10 mg via INTRAVENOUS
  Filled 2017-03-04: qty 1

## 2017-03-04 MED ORDER — SODIUM CHLORIDE 0.9% FLUSH
10.0000 mL | INTRAVENOUS | Status: DC | PRN
  Administered 2017-03-04: 10 mL
  Filled 2017-03-04: qty 10

## 2017-03-04 MED ORDER — SODIUM CHLORIDE 0.9 % IV SOLN
Freq: Once | INTRAVENOUS | Status: AC
  Administered 2017-03-04: 15:00:00 via INTRAVENOUS
  Filled 2017-03-04: qty 1000

## 2017-03-05 ENCOUNTER — Telehealth: Payer: Self-pay | Admitting: *Deleted

## 2017-03-05 DIAGNOSIS — E1122 Type 2 diabetes mellitus with diabetic chronic kidney disease: Secondary | ICD-10-CM | POA: Diagnosis not present

## 2017-03-05 DIAGNOSIS — G8252 Quadriplegia, C1-C4 incomplete: Secondary | ICD-10-CM | POA: Diagnosis not present

## 2017-03-05 DIAGNOSIS — S14104S Unspecified injury at C4 level of cervical spinal cord, sequela: Secondary | ICD-10-CM | POA: Diagnosis not present

## 2017-03-05 DIAGNOSIS — N183 Chronic kidney disease, stage 3 (moderate): Secondary | ICD-10-CM | POA: Diagnosis not present

## 2017-03-05 DIAGNOSIS — C3490 Malignant neoplasm of unspecified part of unspecified bronchus or lung: Secondary | ICD-10-CM | POA: Diagnosis not present

## 2017-03-05 DIAGNOSIS — C787 Secondary malignant neoplasm of liver and intrahepatic bile duct: Secondary | ICD-10-CM | POA: Diagnosis not present

## 2017-03-05 DIAGNOSIS — C7931 Secondary malignant neoplasm of brain: Secondary | ICD-10-CM | POA: Diagnosis not present

## 2017-03-05 DIAGNOSIS — C7951 Secondary malignant neoplasm of bone: Secondary | ICD-10-CM | POA: Diagnosis not present

## 2017-03-05 DIAGNOSIS — I129 Hypertensive chronic kidney disease with stage 1 through stage 4 chronic kidney disease, or unspecified chronic kidney disease: Secondary | ICD-10-CM | POA: Diagnosis not present

## 2017-03-05 NOTE — Telephone Encounter (Signed)
Asking for a verbal order to continue Home PT twice a week for 3 more weeks. [phone]

## 2017-03-05 NOTE — Telephone Encounter (Signed)
Message left on VM to continue with PT as requested

## 2017-03-05 NOTE — Telephone Encounter (Signed)
That's fine by me.  Thanks.

## 2017-03-06 DIAGNOSIS — C3490 Malignant neoplasm of unspecified part of unspecified bronchus or lung: Secondary | ICD-10-CM | POA: Diagnosis not present

## 2017-03-06 DIAGNOSIS — E1122 Type 2 diabetes mellitus with diabetic chronic kidney disease: Secondary | ICD-10-CM | POA: Diagnosis not present

## 2017-03-06 DIAGNOSIS — C7931 Secondary malignant neoplasm of brain: Secondary | ICD-10-CM | POA: Diagnosis not present

## 2017-03-06 DIAGNOSIS — N183 Chronic kidney disease, stage 3 (moderate): Secondary | ICD-10-CM | POA: Diagnosis not present

## 2017-03-06 DIAGNOSIS — C787 Secondary malignant neoplasm of liver and intrahepatic bile duct: Secondary | ICD-10-CM | POA: Diagnosis not present

## 2017-03-06 DIAGNOSIS — G8252 Quadriplegia, C1-C4 incomplete: Secondary | ICD-10-CM | POA: Diagnosis not present

## 2017-03-06 DIAGNOSIS — I129 Hypertensive chronic kidney disease with stage 1 through stage 4 chronic kidney disease, or unspecified chronic kidney disease: Secondary | ICD-10-CM | POA: Diagnosis not present

## 2017-03-06 DIAGNOSIS — S14104S Unspecified injury at C4 level of cervical spinal cord, sequela: Secondary | ICD-10-CM | POA: Diagnosis not present

## 2017-03-06 DIAGNOSIS — C7951 Secondary malignant neoplasm of bone: Secondary | ICD-10-CM | POA: Diagnosis not present

## 2017-03-09 DIAGNOSIS — G8252 Quadriplegia, C1-C4 incomplete: Secondary | ICD-10-CM | POA: Diagnosis not present

## 2017-03-09 DIAGNOSIS — S14104S Unspecified injury at C4 level of cervical spinal cord, sequela: Secondary | ICD-10-CM | POA: Diagnosis not present

## 2017-03-09 DIAGNOSIS — C7931 Secondary malignant neoplasm of brain: Secondary | ICD-10-CM | POA: Diagnosis not present

## 2017-03-09 DIAGNOSIS — I129 Hypertensive chronic kidney disease with stage 1 through stage 4 chronic kidney disease, or unspecified chronic kidney disease: Secondary | ICD-10-CM | POA: Diagnosis not present

## 2017-03-09 DIAGNOSIS — C7951 Secondary malignant neoplasm of bone: Secondary | ICD-10-CM | POA: Diagnosis not present

## 2017-03-09 DIAGNOSIS — C787 Secondary malignant neoplasm of liver and intrahepatic bile duct: Secondary | ICD-10-CM | POA: Diagnosis not present

## 2017-03-09 DIAGNOSIS — C3490 Malignant neoplasm of unspecified part of unspecified bronchus or lung: Secondary | ICD-10-CM | POA: Diagnosis not present

## 2017-03-09 DIAGNOSIS — N183 Chronic kidney disease, stage 3 (moderate): Secondary | ICD-10-CM | POA: Diagnosis not present

## 2017-03-09 DIAGNOSIS — E1122 Type 2 diabetes mellitus with diabetic chronic kidney disease: Secondary | ICD-10-CM | POA: Diagnosis not present

## 2017-03-12 DIAGNOSIS — C787 Secondary malignant neoplasm of liver and intrahepatic bile duct: Secondary | ICD-10-CM | POA: Diagnosis not present

## 2017-03-12 DIAGNOSIS — C3490 Malignant neoplasm of unspecified part of unspecified bronchus or lung: Secondary | ICD-10-CM | POA: Diagnosis not present

## 2017-03-12 DIAGNOSIS — C7951 Secondary malignant neoplasm of bone: Secondary | ICD-10-CM | POA: Diagnosis not present

## 2017-03-12 DIAGNOSIS — G8252 Quadriplegia, C1-C4 incomplete: Secondary | ICD-10-CM | POA: Diagnosis not present

## 2017-03-12 DIAGNOSIS — I129 Hypertensive chronic kidney disease with stage 1 through stage 4 chronic kidney disease, or unspecified chronic kidney disease: Secondary | ICD-10-CM | POA: Diagnosis not present

## 2017-03-12 DIAGNOSIS — E1122 Type 2 diabetes mellitus with diabetic chronic kidney disease: Secondary | ICD-10-CM | POA: Diagnosis not present

## 2017-03-12 DIAGNOSIS — N183 Chronic kidney disease, stage 3 (moderate): Secondary | ICD-10-CM | POA: Diagnosis not present

## 2017-03-12 DIAGNOSIS — S14104S Unspecified injury at C4 level of cervical spinal cord, sequela: Secondary | ICD-10-CM | POA: Diagnosis not present

## 2017-03-12 DIAGNOSIS — C7931 Secondary malignant neoplasm of brain: Secondary | ICD-10-CM | POA: Diagnosis not present

## 2017-03-13 DIAGNOSIS — C3490 Malignant neoplasm of unspecified part of unspecified bronchus or lung: Secondary | ICD-10-CM | POA: Diagnosis not present

## 2017-03-13 DIAGNOSIS — E1122 Type 2 diabetes mellitus with diabetic chronic kidney disease: Secondary | ICD-10-CM | POA: Diagnosis not present

## 2017-03-13 DIAGNOSIS — S14104S Unspecified injury at C4 level of cervical spinal cord, sequela: Secondary | ICD-10-CM | POA: Diagnosis not present

## 2017-03-13 DIAGNOSIS — I129 Hypertensive chronic kidney disease with stage 1 through stage 4 chronic kidney disease, or unspecified chronic kidney disease: Secondary | ICD-10-CM | POA: Diagnosis not present

## 2017-03-13 DIAGNOSIS — C7951 Secondary malignant neoplasm of bone: Secondary | ICD-10-CM | POA: Diagnosis not present

## 2017-03-13 DIAGNOSIS — C787 Secondary malignant neoplasm of liver and intrahepatic bile duct: Secondary | ICD-10-CM | POA: Diagnosis not present

## 2017-03-13 DIAGNOSIS — N183 Chronic kidney disease, stage 3 (moderate): Secondary | ICD-10-CM | POA: Diagnosis not present

## 2017-03-13 DIAGNOSIS — C7931 Secondary malignant neoplasm of brain: Secondary | ICD-10-CM | POA: Diagnosis not present

## 2017-03-13 DIAGNOSIS — G8252 Quadriplegia, C1-C4 incomplete: Secondary | ICD-10-CM | POA: Diagnosis not present

## 2017-03-16 DIAGNOSIS — C7951 Secondary malignant neoplasm of bone: Secondary | ICD-10-CM | POA: Diagnosis not present

## 2017-03-16 DIAGNOSIS — C3490 Malignant neoplasm of unspecified part of unspecified bronchus or lung: Secondary | ICD-10-CM | POA: Diagnosis not present

## 2017-03-16 DIAGNOSIS — G8252 Quadriplegia, C1-C4 incomplete: Secondary | ICD-10-CM | POA: Diagnosis not present

## 2017-03-16 DIAGNOSIS — C7931 Secondary malignant neoplasm of brain: Secondary | ICD-10-CM | POA: Diagnosis not present

## 2017-03-16 DIAGNOSIS — N183 Chronic kidney disease, stage 3 (moderate): Secondary | ICD-10-CM | POA: Diagnosis not present

## 2017-03-16 DIAGNOSIS — E1122 Type 2 diabetes mellitus with diabetic chronic kidney disease: Secondary | ICD-10-CM | POA: Diagnosis not present

## 2017-03-16 DIAGNOSIS — I129 Hypertensive chronic kidney disease with stage 1 through stage 4 chronic kidney disease, or unspecified chronic kidney disease: Secondary | ICD-10-CM | POA: Diagnosis not present

## 2017-03-16 DIAGNOSIS — S14104S Unspecified injury at C4 level of cervical spinal cord, sequela: Secondary | ICD-10-CM | POA: Diagnosis not present

## 2017-03-16 DIAGNOSIS — C787 Secondary malignant neoplasm of liver and intrahepatic bile duct: Secondary | ICD-10-CM | POA: Diagnosis not present

## 2017-03-18 DIAGNOSIS — E1122 Type 2 diabetes mellitus with diabetic chronic kidney disease: Secondary | ICD-10-CM | POA: Diagnosis not present

## 2017-03-18 DIAGNOSIS — C3490 Malignant neoplasm of unspecified part of unspecified bronchus or lung: Secondary | ICD-10-CM | POA: Diagnosis not present

## 2017-03-18 DIAGNOSIS — I129 Hypertensive chronic kidney disease with stage 1 through stage 4 chronic kidney disease, or unspecified chronic kidney disease: Secondary | ICD-10-CM | POA: Diagnosis not present

## 2017-03-18 DIAGNOSIS — C787 Secondary malignant neoplasm of liver and intrahepatic bile duct: Secondary | ICD-10-CM | POA: Diagnosis not present

## 2017-03-18 DIAGNOSIS — C7931 Secondary malignant neoplasm of brain: Secondary | ICD-10-CM | POA: Diagnosis not present

## 2017-03-18 DIAGNOSIS — G8252 Quadriplegia, C1-C4 incomplete: Secondary | ICD-10-CM | POA: Diagnosis not present

## 2017-03-18 DIAGNOSIS — C7951 Secondary malignant neoplasm of bone: Secondary | ICD-10-CM | POA: Diagnosis not present

## 2017-03-18 DIAGNOSIS — S14104S Unspecified injury at C4 level of cervical spinal cord, sequela: Secondary | ICD-10-CM | POA: Diagnosis not present

## 2017-03-18 DIAGNOSIS — N183 Chronic kidney disease, stage 3 (moderate): Secondary | ICD-10-CM | POA: Diagnosis not present

## 2017-03-23 DIAGNOSIS — C787 Secondary malignant neoplasm of liver and intrahepatic bile duct: Secondary | ICD-10-CM | POA: Diagnosis not present

## 2017-03-23 DIAGNOSIS — S14104S Unspecified injury at C4 level of cervical spinal cord, sequela: Secondary | ICD-10-CM | POA: Diagnosis not present

## 2017-03-23 DIAGNOSIS — E1122 Type 2 diabetes mellitus with diabetic chronic kidney disease: Secondary | ICD-10-CM | POA: Diagnosis not present

## 2017-03-23 DIAGNOSIS — I129 Hypertensive chronic kidney disease with stage 1 through stage 4 chronic kidney disease, or unspecified chronic kidney disease: Secondary | ICD-10-CM | POA: Diagnosis not present

## 2017-03-23 DIAGNOSIS — C7931 Secondary malignant neoplasm of brain: Secondary | ICD-10-CM | POA: Diagnosis not present

## 2017-03-23 DIAGNOSIS — C3490 Malignant neoplasm of unspecified part of unspecified bronchus or lung: Secondary | ICD-10-CM | POA: Diagnosis not present

## 2017-03-23 DIAGNOSIS — G8252 Quadriplegia, C1-C4 incomplete: Secondary | ICD-10-CM | POA: Diagnosis not present

## 2017-03-23 DIAGNOSIS — N183 Chronic kidney disease, stage 3 (moderate): Secondary | ICD-10-CM | POA: Diagnosis not present

## 2017-03-23 DIAGNOSIS — C7951 Secondary malignant neoplasm of bone: Secondary | ICD-10-CM | POA: Diagnosis not present

## 2017-03-23 NOTE — Progress Notes (Signed)
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   INTERVAL HISTORY: Patient returns to clinic today for further evaluation and consideration of cycle 3 of 4 of carboplatinum and etoposide. She feels more weak and fatigued today and has a worsening productive cough. She denies any fevers.  She has no further neurologic complaints. She has a fair appetite, but denies weight loss. She has no chest pain, shortness of breath, or hemoptysis. She denies any nausea, vomiting, constipation, or diarrhea. She has no urinary complaints. Patient offers no further specific complaints today.  REVIEW OF SYSTEMS:   Review of Systems  Constitutional: Positive for malaise/fatigue. Negative for fever and weight loss.  Respiratory: Positive for cough and sputum production. Negative for hemoptysis and shortness of breath.   Cardiovascular: Negative.  Negative for chest pain and leg swelling.  Gastrointestinal: Negative.  Negative for abdominal pain.  Genitourinary: Negative.   Musculoskeletal: Negative.   Skin: Negative.  Negative for rash.  Neurological: Positive for focal weakness and weakness. Negative for dizziness, sensory change, seizures and headaches.  Psychiatric/Behavioral: Negative.  The patient is not nervous/anxious.     As per HPI. Otherwise, a complete review of systems is negative.  PAST MEDICAL HISTORY: Past Medical History:  Diagnosis Date  . Brain cancer ([place])   . Diabetes mellitus without complication ([place])   . Hypertension   . Lung cancer ([place])     PAST SURGICAL HISTORY: Past Surgical History:  Procedure Laterality Date  . ABDOMINAL HYSTERECTOMY     partial  . APPENDECTOMY      . PORTA CATH INSERTION N/A 02/04/2017   Procedure: [name];  Surgeon: Algernon Huxley, MD;  Location: [facility];  Service: Cardiovascular;  Laterality: N/A;    FAMILY HISTORY: Family History  Problem Relation Age of Onset  . Diabetes Mother   . Melanoma Sister   . Ovarian cancer Sister   . Diabetes Sister   . Diabetes Brother   . Diabetes Maternal Grandmother   . Diabetes Paternal Grandmother     ADVANCED DIRECTIVES (Y/N):  N  HEALTH MAINTENANCE: Social History  Substance Use Topics  . Smoking status: Former Research scientist (life sciences)  . Smokeless tobacco: Never Used  . Alcohol use No     Colonoscopy:  PAP:  Bone density:  Lipid panel:  Allergies  Allergen Reactions  . Ace Inhibitors Anaphylaxis  . Allopurinol Anaphylaxis  . Angiotensin Receptor Blockers Anaphylaxis  . Codeine Anaphylaxis  . Enalapril Swelling    Other reaction(s): SWELLING/EDEMA  . Furosemide Swelling    Other reaction(s): OTHER  . Hydralazine Swelling    Other reaction(s): SWELLING/EDEMA  . Hydrochlorothiazide Anaphylaxis  . Irbesartan Swelling    Other reaction(s): SWELLING/EDEMA  . Levofloxacin Swelling    Other reaction(s): SWELLING/EDEMA  . Lidocaine Swelling    Other reaction(s): SWELLING/EDEMA  . Naproxen Sodium Anaphylaxis  . Spironolactone Anaphylaxis  . Tramadol Anaphylaxis    Other reaction(s): NAUSEA    Current Outpatient Prescriptions  Medication Sig Dispense Refill  . acetaminophen (TYLENOL) 500 MG tablet Take 500 mg by mouth 2 (two) times daily as needed for moderate pain.    Marland Kitchen alendronate (FOSAMAX) 70 MG tablet Take 70 mg by mouth every Friday.     Marland Kitchen  amLODipine (NORVASC) 10 MG tablet Take 10 mg by mouth at bedtime.     Marland Kitchen aspirin EC 81 MG tablet Take 81 mg by mouth daily.     . Calcium Carb-Cholecalciferol 600-800 MG-UNIT TABS Take 1 tablet by mouth daily.     . cetirizine (ZYRTEC) 10 MG tablet Take 10 mg by mouth at bedtime.     . cholecalciferol (VITAMIN D) 400 units  TABS tablet Take 400 Units by mouth daily.    . colchicine-probenecid 0.5-500 MG tablet Take 1 tablet by mouth 2 (two) times daily.     Marland Kitchen dexamethasone (DECADRON) 4 MG tablet Take 1 tablet (4 mg total) by mouth daily. 30 tablet 1  . docusate sodium (COLACE) 100 MG capsule Take 400 mg by mouth at bedtime.     Marland Kitchen erythromycin ophthalmic ointment Apply to eyelid margins every night    . esomeprazole (NEXIUM) 40 MG capsule Take 40 mg by mouth daily.     Marland Kitchen gabapentin (NEURONTIN) 300 MG capsule Take 300 mg by mouth.    Marland Kitchen glucose blood (ACCU-CHEK COMPACT STRIPS) test strip Test daily before all meals/snacks and once before bedtime.    . Hypromellose (ARTIFICIAL TEARS OP) Apply 1 drop to eye daily as needed (dry eyes).    . Melatonin 10 MG TABS Take 20 mg by mouth at bedtime.    . Multiple Vitamins-Minerals (PRESERVISION AREDS 2 PO) Take 1 tablet by mouth 2 (two) times daily.    . ondansetron (ZOFRAN) 8 MG tablet Take 1 tablet (8 mg total) by mouth 2 (two) times daily as needed for refractory nausea / vomiting. 60 tablet 2  . prochlorperazine (COMPAZINE) 10 MG tablet Take 1 tablet (10 mg total) by mouth every 6 (six) hours as needed (Nausea or vomiting). 60 tablet 2  . simvastatin (ZOCOR) 20 MG tablet Take 20 mg by mouth every evening.     . carvedilol (COREG) 25 MG tablet Take 50 mg by mouth 2 (two) times daily with a meal.     . cloNIDine (CATAPRES - DOSED IN MG/24 HR) 0.3 mg/24hr patch Place 0.3 mg onto the skin once a week. Change patch on Wednesdays    . doxycycline (VIBRA-TABS) 100 MG tablet Take 1 tablet (100 mg total) by mouth 2 (two) times daily. 14 tablet 0  . gabapentin (NEURONTIN) 300 MG capsule Take 300 mg by mouth 3 (three) times daily.     . traZODone (DESYREL) 50 MG tablet Take 50 mg by mouth at bedtime as needed for sleep.      No current facility-administered medications for this visit.     OBJECTIVE: Vitals:   03/24/17 0952  BP: 113/72  Pulse: 72  Resp: 20  Temp: 98.1 F (36.7  C)     Body mass index is 22.48 kg/m.    ECOG FS:1 - Symptomatic but completely ambulatory  General: Well-developed, well-nourished, no acute distress. Eyes: Pink conjunctiva, anicteric sclera. Lungs: Clear to auscultation bilaterally. Heart: Regular rate and rhythm. No rubs, murmurs, or gallops. Abdomen: Soft, nontender, nondistended. No organomegaly noted, normoactive bowel sounds. Musculoskeletal: No edema, cyanosis, or clubbing. Neuro: Alert, answering all questions appropriately. Cranial nerves grossly intact. Skin: No rashes or petechiae noted. Psych: Normal affect.   LAB RESULTS:  Lab Results  Component Value Date   NA 129 (L) 03/24/2017   K 4.3 03/24/2017   CL 91 (L) 03/24/2017   CO2 27 03/24/2017   GLUCOSE 186 (H) 03/24/2017   BUN 20 03/24/2017   CREATININE  0.98 03/24/2017   CALCIUM 8.3 (L) 03/24/2017   PROT 6.6 03/24/2017   ALBUMIN 3.4 (L) 03/24/2017   AST 22 03/24/2017   ALT 24 03/24/2017   ALKPHOS 132 (H) 03/24/2017   BILITOT 0.7 03/24/2017   GFRNONAA 54 (L) 03/24/2017   GFRAA >60 03/24/2017    Lab Results  Component Value Date   WBC 45.5 (H) 03/24/2017   NEUTROABS 42.3 (H) 03/24/2017   HGB 11.3 (L) 03/24/2017   HCT 32.7 (L) 03/24/2017   MCV 89.4 03/24/2017   PLT 518 (H) 03/24/2017     STUDIES: Mr Jeri Cos MV Contrast  Result Date: 02/26/2017 CLINICAL DATA:  76 year old female with 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 Small cell lung cancer. Seizure like episodes. EXAM: MRI HEAD WITHOUT AND WITH CONTRAST TECHNIQUE: Multiplanar, multiecho pulse sequences of the brain and surrounding structures were obtained without and with intravenous contrast. CONTRAST:  62mL MULTIHANCE GADOBENATE DIMEGLUMINE 529 MG/ML IV SOLN COMPARISON:  No recent study for comparison. Neck CT without contrast 07/20/2011. Head CT without contrast 11/14/2008. FINDINGS: Brain: Mildly lobulated 3 cm irregular rim enhancing and partially hemorrhagic mass in the right superior frontal  gyrus. This appears to be centered in the pre motor cortex. Heterogeneous restricted diffusion within the mass such as can be seen following radiosurgery treatment. Surrounding vasogenic edema. But overall mild regional mass effect. Wrap less pronounced wrap artifact on coronal postcontrast images. Artifact in the right occipital lobe on postcontrast sagittal series 14, image 10. No other brain metastasis identified.  No dural thickening. No restricted diffusion to suggest acute infarction. No midline shift, ventriculomegaly, extra-axial collection or acute intracranial hemorrhage. Cervicomedullary junction and pituitary are within normal limits. Patchy and confluent bilateral cerebral white matter T2 and FLAIR hyperintensity. Small chronic lacune in the right caudate nucleus. Mild patchy T2 hyperintensity in the pons. Vascular: Major intracranial vascular flow voids are preserved, with dominant appearing distal right vertebral artery. Skull and upper cervical spine: Severe degenerative cervical spinal stenosis suspected at C3-C4 (series 2, image 13) with moderate to severe spinal cord mass effect. Visible bone marrow signal is within normal limits. Sinuses/Orbits: Negative; postoperative changes to both globes. Other: Mastoids are clear. Visible internal auditory structures appear normal. Negative scalp soft tissues. IMPRESSION: 1. Right superior frontal gyrus 3 cm metastasis with surrounding vasogenic edema but mild regional mass effect. Signal changes within the lesion suggest this may have been previously radiated. 2. No other metastatic disease to the brain identified. 3. 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 4. No other acute intracranial abnormality. Chronic small vessel disease. Electronically Signed   By: Genevie Ann M.D.   On: 02/26/2017 15:52   US Venous Img Lower Unilateral Left  Result Date: 02/26/2017 CLINICAL DATA:  Left leg edema.  Small-cell lung cancer. EXAM: LEFT LOWER  EXTREMITY VENOUS DOPPLER ULTRASOUND TECHNIQUE: Gray-scale sonography with graded compression, as well as color Doppler and duplex ultrasound were performed to evaluate the lower extremity deep venous systems from the level of the common femoral vein and including the common femoral, femoral, profunda femoral, popliteal and calf veins including the posterior tibial, peroneal and gastrocnemius veins when visible. The superficial great saphenous vein was also interrogated. Spectral Doppler was utilized to evaluate flow at rest and with distal augmentation maneuvers in the common femoral, femoral and popliteal veins. COMPARISON:  No recent. FINDINGS: Contralateral Common Femoral Vein: Respiratory phasicity is normal and symmetric with the symptomatic side. No evidence of thrombus. Normal compressibility. Common Femoral Vein: No evidence  of thrombus. Normal compressibility, respiratory phasicity and response to augmentation. Saphenofemoral Junction: No evidence of thrombus. Normal compressibility and flow on color Doppler imaging. Profunda Femoral Vein: No evidence of thrombus. Normal compressibility and flow on color Doppler imaging. Femoral Vein: No evidence of thrombus. Normal compressibility, respiratory phasicity and response to augmentation. Popliteal Vein: No evidence of thrombus. Normal compressibility, respiratory phasicity and response to augmentation. Calf Veins: No evidence of thrombus. Normal compressibility and flow on color Doppler imaging. Superficial Great Saphenous Vein: No evidence of thrombus. Normal compressibility and flow on color Doppler imaging. Other Findings:  None. IMPRESSION: No evidence of DVT left lower extremity. 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   PLAN:    1. 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 No bone, hepatic, or other suspicious lesions were noted outside the brain. Primary lesion appears to be a 1.8 cm right upper lobe nodule. Patient was noted to have a 1.6 cm left hepatic lesion, but this was indeterminate and possibly a hemangioma. Liver lesion was PET negative. Patient completed XRT her brain. Delay cycle 3 of carboplatinum and etoposide today secondary to underlying cough and congestion. She also receives etoposide on days 2 and 3. Patient will require Neulasta support with each treatment. Plan to reimage after approximately 4 cycles. Return to clinic in 1 week for reconsideration of cycle 3.  2. Metastatic brain lesion: MRI the brain completed at [facility] on January 01, 2017 revealed a 4.5 x 3.4 x 4.3 lesion with rim enhancement in the right frontal lobe. Patient has completed XRT and Decadron taper with significant improvement of her left sided weakness. 3. Seizures: She has no new obvious metastatic lesions, but patient continues to have vasogenic edema. She was placed on 4 mg oral Decadron daily. 4. Lower extremity edema: Ultrasound Doppler negative for DVT. Monitor. 5. Cough/congestion: Patient was given a prescription for doxycycline given her multiple allergies to antibiotics. 6. Leukocytosis: Multifactorial, possibly also related to underlying infection.                                                                                                                                                    Patient expressed understanding and was in agreement with this plan. She also understands that She can call clinic at any time with any questions, concerns, or complaints.   Cancer Staging Small cell lung cancer [facility]) Staging form: Lung, AJCC 8th Edition - Clinical stage from 01/31/2017: Stage IVB (cT1b, cN2, cM1c)  - Signed by  Lloyd Huger, MD on 01/31/2017   Lloyd Huger, MD   03/27/2017 2:18 PM

## 2017-03-24 ENCOUNTER — Inpatient Hospital Stay: Payer: Medicare HMO

## 2017-03-24 ENCOUNTER — Inpatient Hospital Stay (HOSPITAL_BASED_OUTPATIENT_CLINIC_OR_DEPARTMENT_OTHER): Payer: Medicare HMO | Admitting: Oncology

## 2017-03-24 VITALS — BP 113/72 | HR 72 | Temp 98.1°F | Resp 20 | Wt 133.0 lb

## 2017-03-24 DIAGNOSIS — Z8041 Family history of malignant neoplasm of ovary: Secondary | ICD-10-CM | POA: Diagnosis not present

## 2017-03-24 DIAGNOSIS — R05 Cough: Secondary | ICD-10-CM

## 2017-03-24 DIAGNOSIS — R600 Localized edema: Secondary | ICD-10-CM | POA: Diagnosis not present

## 2017-03-24 DIAGNOSIS — C3491 Malignant neoplasm of unspecified part of right bronchus or lung: Secondary | ICD-10-CM | POA: Diagnosis not present

## 2017-03-24 DIAGNOSIS — D72829 Elevated white blood cell count, unspecified: Secondary | ICD-10-CM | POA: Diagnosis not present

## 2017-03-24 DIAGNOSIS — Z7982 Long term (current) use of aspirin: Secondary | ICD-10-CM

## 2017-03-24 DIAGNOSIS — R531 Weakness: Secondary | ICD-10-CM

## 2017-03-24 DIAGNOSIS — R918 Other nonspecific abnormal finding of lung field: Secondary | ICD-10-CM | POA: Diagnosis not present

## 2017-03-24 DIAGNOSIS — C7931 Secondary malignant neoplasm of brain: Secondary | ICD-10-CM

## 2017-03-24 DIAGNOSIS — R5383 Other fatigue: Secondary | ICD-10-CM | POA: Diagnosis not present

## 2017-03-24 DIAGNOSIS — Z79899 Other long term (current) drug therapy: Secondary | ICD-10-CM

## 2017-03-24 DIAGNOSIS — R569 Unspecified convulsions: Secondary | ICD-10-CM | POA: Diagnosis not present

## 2017-03-24 DIAGNOSIS — E119 Type 2 diabetes mellitus without complications: Secondary | ICD-10-CM | POA: Diagnosis not present

## 2017-03-24 DIAGNOSIS — Z87891 Personal history of nicotine dependence: Secondary | ICD-10-CM

## 2017-03-24 DIAGNOSIS — Z5111 Encounter for antineoplastic chemotherapy: Secondary | ICD-10-CM | POA: Diagnosis not present

## 2017-03-24 DIAGNOSIS — I10 Essential (primary) hypertension: Secondary | ICD-10-CM | POA: Diagnosis not present

## 2017-03-24 DIAGNOSIS — C3490 Malignant neoplasm of unspecified part of unspecified bronchus or lung: Secondary | ICD-10-CM

## 2017-03-24 LAB — CBC WITH DIFFERENTIAL/PLATELET
BASOS PCT: 0 %
Basophils Absolute: 0.1 10*3/uL (ref 0–0.1)
Eosinophils Absolute: 0 10*3/uL (ref 0–0.7)
Eosinophils Relative: 0 %
HEMATOCRIT: 32.7 % — AB (ref 35.0–47.0)
Hemoglobin: 11.3 g/dL — ABNORMAL LOW (ref 12.0–16.0)
LYMPHS PCT: 2 %
Lymphs Abs: 1 10*3/uL (ref 1.0–3.6)
MCH: 30.8 pg (ref 26.0–34.0)
MCHC: 34.5 g/dL (ref 32.0–36.0)
MCV: 89.4 fL (ref 80.0–100.0)
MONO ABS: 2 10*3/uL — AB (ref 0.2–0.9)
MONOS PCT: 5 %
NEUTROS ABS: 42.3 10*3/uL — AB (ref 1.4–6.5)
NEUTROS PCT: 93 %
Platelets: 518 10*3/uL — ABNORMAL HIGH (ref 150–440)
RBC: 3.66 MIL/uL — ABNORMAL LOW (ref 3.80–5.20)
RDW: 19.5 % — AB (ref 11.5–14.5)
WBC: 45.5 10*3/uL — ABNORMAL HIGH (ref 3.6–11.0)

## 2017-03-24 LAB — COMPREHENSIVE METABOLIC PANEL
ALBUMIN: 3.4 g/dL — AB (ref 3.5–5.0)
ALK PHOS: 132 U/L — AB (ref 38–126)
ALT: 24 U/L (ref 14–54)
AST: 22 U/L (ref 15–41)
Anion gap: 11 (ref 5–15)
BILIRUBIN TOTAL: 0.7 mg/dL (ref 0.3–1.2)
BUN: 20 mg/dL (ref 6–20)
CALCIUM: 8.3 mg/dL — AB (ref 8.9–10.3)
CO2: 27 mmol/L (ref 22–32)
Chloride: 91 mmol/L — ABNORMAL LOW (ref 101–111)
Creatinine, Ser: 0.98 mg/dL (ref 0.44–1.00)
GFR calc Af Amer: >60 mL/min (ref >60)
GFR calc non Af Amer: 54 mL/min — ABNORMAL LOW (ref >60)
GLUCOSE: 186 mg/dL — AB (ref 65–99)
POTASSIUM: 4.3 mmol/L (ref 3.5–5.1)
Sodium: 129 mmol/L — ABNORMAL LOW (ref 135–145)
TOTAL PROTEIN: 6.6 g/dL (ref 6.5–8.1)

## 2017-03-24 MED ORDER — SODIUM CHLORIDE 0.9% FLUSH
10.0000 mL | Freq: Once | INTRAVENOUS | Status: AC
  Administered 2017-03-24: 10 mL via INTRAVENOUS
  Filled 2017-03-24: qty 10

## 2017-03-24 MED ORDER — HEPARIN SOD (PORK) LOCK FLUSH 100 UNIT/ML IV SOLN
500.0000 [IU] | Freq: Once | INTRAVENOUS | Status: AC
  Administered 2017-03-24: 500 [IU] via INTRAVENOUS
  Filled 2017-03-24: qty 5

## 2017-03-24 MED ORDER — DOXYCYCLINE HYCLATE 100 MG PO TABS: 100.0000 mg | ORAL_TABLET | Freq: Two times a day (BID) | ORAL | 0 refills | Status: DC

## 2017-03-24 NOTE — Progress Notes (Signed)
Patient reports not feeling well today, productive cough.

## 2017-03-25 ENCOUNTER — Ambulatory Visit: Payer: Medicare HMO

## 2017-03-25 ENCOUNTER — Inpatient Hospital Stay: Payer: Medicare HMO

## 2017-03-25 DIAGNOSIS — G8252 Quadriplegia, C1-C4 incomplete: Secondary | ICD-10-CM | POA: Diagnosis not present

## 2017-03-25 DIAGNOSIS — C7951 Secondary malignant neoplasm of bone: Secondary | ICD-10-CM | POA: Diagnosis not present

## 2017-03-25 DIAGNOSIS — E1122 Type 2 diabetes mellitus with diabetic chronic kidney disease: Secondary | ICD-10-CM | POA: Diagnosis not present

## 2017-03-25 DIAGNOSIS — I129 Hypertensive chronic kidney disease with stage 1 through stage 4 chronic kidney disease, or unspecified chronic kidney disease: Secondary | ICD-10-CM | POA: Diagnosis not present

## 2017-03-25 DIAGNOSIS — C7931 Secondary malignant neoplasm of brain: Secondary | ICD-10-CM | POA: Diagnosis not present

## 2017-03-25 DIAGNOSIS — C3490 Malignant neoplasm of unspecified part of unspecified bronchus or lung: Secondary | ICD-10-CM | POA: Diagnosis not present

## 2017-03-25 DIAGNOSIS — C787 Secondary malignant neoplasm of liver and intrahepatic bile duct: Secondary | ICD-10-CM | POA: Diagnosis not present

## 2017-03-25 DIAGNOSIS — S14104S Unspecified injury at C4 level of cervical spinal cord, sequela: Secondary | ICD-10-CM | POA: Diagnosis not present

## 2017-03-25 DIAGNOSIS — N183 Chronic kidney disease, stage 3 (moderate): Secondary | ICD-10-CM | POA: Diagnosis not present

## 2017-03-26 ENCOUNTER — Inpatient Hospital Stay: Payer: Medicare HMO

## 2017-03-30 ENCOUNTER — Inpatient Hospital Stay: Payer: Medicare HMO | Attending: Oncology | Admitting: Oncology

## 2017-03-30 ENCOUNTER — Inpatient Hospital Stay: Payer: Medicare HMO

## 2017-03-30 VITALS — BP 117/69 | HR 71 | Temp 97.4°F | Resp 18 | Ht 64.5 in | Wt 135.5 lb

## 2017-03-30 DIAGNOSIS — C7802 Secondary malignant neoplasm of left lung: Secondary | ICD-10-CM

## 2017-03-30 DIAGNOSIS — R609 Edema, unspecified: Secondary | ICD-10-CM | POA: Insufficient documentation

## 2017-03-30 DIAGNOSIS — C7931 Secondary malignant neoplasm of brain: Secondary | ICD-10-CM | POA: Diagnosis not present

## 2017-03-30 DIAGNOSIS — Z923 Personal history of irradiation: Secondary | ICD-10-CM

## 2017-03-30 DIAGNOSIS — Z79899 Other long term (current) drug therapy: Secondary | ICD-10-CM | POA: Insufficient documentation

## 2017-03-30 DIAGNOSIS — E119 Type 2 diabetes mellitus without complications: Secondary | ICD-10-CM | POA: Diagnosis not present

## 2017-03-30 DIAGNOSIS — Z87891 Personal history of nicotine dependence: Secondary | ICD-10-CM

## 2017-03-30 DIAGNOSIS — C3490 Malignant neoplasm of unspecified part of unspecified bronchus or lung: Secondary | ICD-10-CM

## 2017-03-30 DIAGNOSIS — Z7982 Long term (current) use of aspirin: Secondary | ICD-10-CM | POA: Diagnosis not present

## 2017-03-30 DIAGNOSIS — R569 Unspecified convulsions: Secondary | ICD-10-CM | POA: Diagnosis not present

## 2017-03-30 DIAGNOSIS — D72829 Elevated white blood cell count, unspecified: Secondary | ICD-10-CM | POA: Insufficient documentation

## 2017-03-30 DIAGNOSIS — Z8041 Family history of malignant neoplasm of ovary: Secondary | ICD-10-CM | POA: Insufficient documentation

## 2017-03-30 DIAGNOSIS — I10 Essential (primary) hypertension: Secondary | ICD-10-CM | POA: Insufficient documentation

## 2017-03-30 DIAGNOSIS — R509 Fever, unspecified: Secondary | ICD-10-CM | POA: Diagnosis not present

## 2017-03-30 DIAGNOSIS — Z5111 Encounter for antineoplastic chemotherapy: Secondary | ICD-10-CM | POA: Insufficient documentation

## 2017-03-30 DIAGNOSIS — C3411 Malignant neoplasm of upper lobe, right bronchus or lung: Secondary | ICD-10-CM | POA: Diagnosis not present

## 2017-03-30 DIAGNOSIS — C7801 Secondary malignant neoplasm of right lung: Secondary | ICD-10-CM | POA: Insufficient documentation

## 2017-03-30 DIAGNOSIS — C3491 Malignant neoplasm of unspecified part of right bronchus or lung: Secondary | ICD-10-CM

## 2017-03-30 DIAGNOSIS — I700 Atherosclerosis of aorta: Secondary | ICD-10-CM | POA: Diagnosis not present

## 2017-03-30 DIAGNOSIS — R0981 Nasal congestion: Secondary | ICD-10-CM

## 2017-03-30 DIAGNOSIS — R05 Cough: Secondary | ICD-10-CM | POA: Insufficient documentation

## 2017-03-30 LAB — COMPREHENSIVE METABOLIC PANEL
ALBUMIN: 3 g/dL — AB (ref 3.5–5.0)
ALT: 21 U/L (ref 14–54)
AST: 23 U/L (ref 15–41)
Alkaline Phosphatase: 103 U/L (ref 38–126)
Anion gap: 8 (ref 5–15)
BILIRUBIN TOTAL: 0.6 mg/dL (ref 0.3–1.2)
BUN: 25 mg/dL — AB (ref 6–20)
CHLORIDE: 96 mmol/L — AB (ref 101–111)
CO2: 27 mmol/L (ref 22–32)
CREATININE: 0.81 mg/dL (ref 0.44–1.00)
Calcium: 8.2 mg/dL — ABNORMAL LOW (ref 8.9–10.3)
GFR calc Af Amer: >60 mL/min (ref >60)
Glucose, Bld: 228 mg/dL — ABNORMAL HIGH (ref 65–99)
POTASSIUM: 3.7 mmol/L (ref 3.5–5.1)
Sodium: 131 mmol/L — ABNORMAL LOW (ref 135–145)
Total Protein: 6 g/dL — ABNORMAL LOW (ref 6.5–8.1)

## 2017-03-30 LAB — CBC WITH DIFFERENTIAL/PLATELET
BASOS ABS: 0.1 10*3/uL (ref 0–0.1)
Basophils Relative: 0 %
Eosinophils Absolute: 0 10*3/uL (ref 0–0.7)
Eosinophils Relative: 0 %
HEMATOCRIT: 32.7 % — AB (ref 35.0–47.0)
Hemoglobin: 11.2 g/dL — ABNORMAL LOW (ref 12.0–16.0)
LYMPHS PCT: 3 %
Lymphs Abs: 0.9 10*3/uL — ABNORMAL LOW (ref 1.0–3.6)
MCH: 30.9 pg (ref 26.0–34.0)
MCHC: 34.2 g/dL (ref 32.0–36.0)
MCV: 90.4 fL (ref 80.0–100.0)
MONO ABS: 1 10*3/uL — AB (ref 0.2–0.9)
Monocytes Relative: 3 %
NEUTROS ABS: 27.8 10*3/uL — AB (ref 1.4–6.5)
NEUTROS PCT: 94 %
Platelets: 440 10*3/uL (ref 150–440)
RBC: 3.62 MIL/uL — AB (ref 3.80–5.20)
RDW: 19 % — AB (ref 11.5–14.5)
WBC: 29.9 10*3/uL — AB (ref 3.6–11.0)

## 2017-03-30 MED ORDER — SODIUM CHLORIDE 0.9 % IV SOLN
Freq: Once | INTRAVENOUS | Status: AC
  Administered 2017-03-30: 11:00:00 via INTRAVENOUS
  Filled 2017-03-30: qty 1000

## 2017-03-30 MED ORDER — HEPARIN SOD (PORK) LOCK FLUSH 100 UNIT/ML IV SOLN
500.0000 [IU] | Freq: Once | INTRAVENOUS | Status: AC | PRN
  Administered 2017-03-30: 500 [IU]
  Filled 2017-03-30: qty 5

## 2017-03-30 MED ORDER — PALONOSETRON HCL INJECTION 0.25 MG/5ML
0.2500 mg | Freq: Once | INTRAVENOUS | Status: AC
  Administered 2017-03-30: 0.25 mg via INTRAVENOUS
  Filled 2017-03-30: qty 5

## 2017-03-30 MED ORDER — SODIUM CHLORIDE 0.9 % IV SOLN
100.0000 mg/m2 | Freq: Once | INTRAVENOUS | Status: AC
  Administered 2017-03-30: 170 mg via INTRAVENOUS
  Filled 2017-03-30: qty 8.5

## 2017-03-30 MED ORDER — DEXAMETHASONE SODIUM PHOSPHATE 10 MG/ML IJ SOLN
10.0000 mg | Freq: Once | INTRAMUSCULAR | Status: AC
  Administered 2017-03-30: 10 mg via INTRAVENOUS
  Filled 2017-03-30: qty 1

## 2017-03-30 MED ORDER — SODIUM CHLORIDE 0.9 % IV SOLN
345.5000 mg | Freq: Once | INTRAVENOUS | Status: AC
  Administered 2017-03-30: 350 mg via INTRAVENOUS
  Filled 2017-03-30: qty 35

## 2017-03-30 MED ORDER — SODIUM CHLORIDE 0.9 % IV SOLN: 10.0000 mg | Freq: Once | INTRAVENOUS | Status: DC

## 2017-03-30 NOTE — Progress Notes (Signed)
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   INTERVAL HISTORY: Patient returns to clinic today for further evaluation and reconsideration of cycle 3 of 4 of carboplatinum and etoposide. She is significantly improved over last week with resolution of her weakness and fatigue and cough. She denies any fevers. She has no further neurologic complaints. She has a fair appetite, but denies weight loss. She has no chest pain, shortness of breath, or hemoptysis. She denies any nausea, vomiting, constipation, or diarrhea. She has no urinary complaints. Patient offers no further specific complaints today.  REVIEW OF SYSTEMS:   Review of Systems  Constitutional: Negative for fever, malaise/fatigue and weight loss.  Respiratory: Negative for cough, hemoptysis, sputum production and shortness of breath.   Cardiovascular: Negative.  Negative for chest pain and leg swelling.  Gastrointestinal: Negative.  Negative for abdominal pain.  Genitourinary: Negative.   Musculoskeletal: Negative.   Skin: Negative.  Negative for rash.  Neurological: Positive for focal weakness. Negative for dizziness, sensory change, seizures, weakness and headaches.  Psychiatric/Behavioral: Negative.  The patient is not nervous/anxious.     As per HPI. Otherwise, a complete review of systems is negative.  PAST MEDICAL HISTORY: Past Medical History:  Diagnosis Date  . Brain cancer ([place])   . Diabetes mellitus without complication ([place])   . Hypertension   . Lung cancer ([place])     PAST SURGICAL HISTORY: Past Surgical History:  Procedure Laterality Date  . ABDOMINAL HYSTERECTOMY     partial  . APPENDECTOMY    .  PORTA CATH INSERTION N/A 02/04/2017   Procedure: [name];  Surgeon: Algernon Huxley, MD;  Location: [place];  Service: Cardiovascular;  Laterality: N/A;    FAMILY HISTORY: Family History  Problem Relation Age of Onset  . Diabetes Mother   . Melanoma Sister   . Ovarian cancer Sister   . Diabetes Sister   . Diabetes Brother   . Diabetes Maternal Grandmother   . Diabetes Paternal Grandmother     ADVANCED DIRECTIVES (Y/N):  N  HEALTH MAINTENANCE: Social History  Substance Use Topics  . Smoking status: Former Research scientist (life sciences)  . Smokeless tobacco: Never Used  . Alcohol use No     Colonoscopy:  PAP:  Bone density:  Lipid panel:  Allergies  Allergen Reactions  . Ace Inhibitors Anaphylaxis  . Allopurinol Anaphylaxis  . Angiotensin Receptor Blockers Anaphylaxis  . Codeine Anaphylaxis  . Enalapril Swelling    Other reaction(s): SWELLING/EDEMA  . Furosemide Swelling    Other reaction(s): OTHER  . Hydralazine Swelling    Other reaction(s): SWELLING/EDEMA  . Hydrochlorothiazide Anaphylaxis  . Irbesartan Swelling    Other reaction(s): SWELLING/EDEMA  . Levofloxacin Swelling    Other reaction(s): SWELLING/EDEMA  . Lidocaine Swelling    Other reaction(s): SWELLING/EDEMA  . Naproxen Sodium Anaphylaxis  . Spironolactone Anaphylaxis  . Tramadol Anaphylaxis    Other reaction(s): NAUSEA    Current Outpatient Prescriptions  Medication Sig Dispense Refill  . acetaminophen (TYLENOL) 500 MG tablet Take 500 mg by mouth 2 (two) times daily as needed for moderate pain.    Marland Kitchen alendronate (FOSAMAX) 70 MG tablet Take 70 mg by mouth every Friday.     Marland Kitchen  amLODipine (NORVASC) 10 MG tablet Take 10 mg by mouth at bedtime.     Marland Kitchen aspirin EC 81 MG tablet Take 81 mg by mouth daily.     . Calcium Carb-Cholecalciferol 600-800 MG-UNIT TABS Take 1 tablet by mouth daily.     . carvedilol (COREG) 25 MG tablet Take 50 mg by mouth 2 (two) times daily with a meal.     . cetirizine (ZYRTEC) 10  MG tablet Take 10 mg by mouth at bedtime.     . cholecalciferol (VITAMIN D) 400 units TABS tablet Take 400 Units by mouth daily.    . cloNIDine (CATAPRES - DOSED IN MG/24 HR) 0.3 mg/24hr patch Place 0.3 mg onto the skin once a week. Change patch on Wednesdays    . colchicine-probenecid 0.5-500 MG tablet Take 1 tablet by mouth 2 (two) times daily.     Marland Kitchen dexamethasone (DECADRON) 4 MG tablet Take 1 tablet (4 mg total) by mouth daily. 30 tablet 1  . docusate sodium (COLACE) 100 MG capsule Take 400 mg by mouth at bedtime.     Marland Kitchen doxycycline (VIBRA-TABS) 100 MG tablet Take 1 tablet (100 mg total) by mouth 2 (two) times daily. 14 tablet 0  . erythromycin ophthalmic ointment Apply to eyelid margins every night    . esomeprazole (NEXIUM) 40 MG capsule Take 40 mg by mouth daily.     Marland Kitchen gabapentin (NEURONTIN) 300 MG capsule Take 300 mg by mouth 3 (three) times daily.     . Hypromellose (ARTIFICIAL TEARS OP) Apply 1 drop to eye daily as needed (dry eyes).    . Melatonin 10 MG TABS Take 20 mg by mouth at bedtime.    . ondansetron (ZOFRAN) 8 MG tablet Take 1 tablet (8 mg total) by mouth 2 (two) times daily as needed for refractory nausea / vomiting. 60 tablet 2  . prochlorperazine (COMPAZINE) 10 MG tablet Take 1 tablet (10 mg total) by mouth every 6 (six) hours as needed (Nausea or vomiting). 60 tablet 2  . simvastatin (ZOCOR) 20 MG tablet Take 20 mg by mouth every evening.      No current facility-administered medications for this visit.     OBJECTIVE: Vitals:   03/30/17 0917  BP: 117/69  Pulse: 71  Resp: 18  Temp: 97.4 F (36.3 C)     Body mass index is 22.9 kg/m.    ECOG FS:1 - Symptomatic but completely ambulatory  General: Well-developed, well-nourished, no acute distress. Eyes: Pink conjunctiva, anicteric sclera. Lungs: Clear to auscultation bilaterally. Heart: Regular rate and rhythm. No rubs, murmurs, or gallops. Abdomen: Soft, nontender, nondistended. No organomegaly noted, normoactive  bowel sounds. Musculoskeletal: No edema, cyanosis, or clubbing. Neuro: Alert, answering all questions appropriately. Cranial nerves grossly intact. Skin: No rashes or petechiae noted. Psych: Normal affect.   LAB RESULTS:  Lab Results  Component Value Date   NA 131 (L) 03/30/2017   K 3.7 03/30/2017   CL 96 (L) 03/30/2017   CO2 27 03/30/2017   GLUCOSE 228 (H) 03/30/2017   BUN 25 (H) 03/30/2017   CREATININE 0.81 03/30/2017   CALCIUM 8.2 (L) 03/30/2017   PROT 6.0 (L) 03/30/2017   ALBUMIN 3.0 (L) 03/30/2017   AST 23 03/30/2017   ALT 21 03/30/2017   ALKPHOS 103 03/30/2017   BILITOT 0.6 03/30/2017   GFRNONAA >60 03/30/2017   GFRAA >60 03/30/2017    Lab Results  Component Value Date   WBC 29.9 (H) 03/30/2017   NEUTROABS 27.8 (H) 03/30/2017  HGB 11.2 (L) 03/30/2017   HCT 32.7 (L) 03/30/2017   MCV 90.4 03/30/2017   PLT 440 03/30/2017     STUDIES: No results found.  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   PLAN:    1. 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 No bone, hepatic, or other suspicious lesions were noted outside the brain. Primary lesion appears to be a 1.8 cm right upper lobe nodule. Patient was noted to have a 1.6 cm left hepatic lesion, but this was indeterminate and possibly a hemangioma. Liver lesion was PET negative. Patient completed XRT her brain. Proceed with cycle 3 of carboplatinum and etoposide today. She also receives etoposide on days 2 and 3. Patient will require Neulasta support with each treatment. Plan to reimage after approximately 4 cycles. Return to clinic in 3 weeks for consideration of cycle 4.  2. Metastatic brain lesion: MRI the brain completed at [facility] on January 01, 2017 revealed a 4.5 x 3.4 x 4.3 lesion with rim enhancement in the right frontal lobe. Patient has completed XRT and Decadron taper with significant improvement of her left sided weakness. 3. Seizures: She has no new obvious metastatic lesions, but patient continues to have vasogenic edema. She was placed on 4 mg oral Decadron daily. 4. Lower extremity edema: Ultrasound Doppler negative for DVT. Monitor. 5. Cough/congestion: Significantly improved. Patient has been instructed to complete her antibiotic course as prescribed. 6. Leukocytosis: Multifactorial, improved. Monitor.                                                                                                                                                    Patient expressed understanding and was in agreement with this plan. She also understands that She can call clinic at any time with any questions, concerns, or complaints.   Cancer Staging Small cell lung cancer [facility]) Staging form: Lung, AJCC 8th Edition - Clinical stage from 01/31/2017: Stage IVB (cT1b, cN2, cM1c) - Signed by Lloyd Huger, MD on 01/31/2017   Lloyd Huger, MD   03/31/2017 1:20 PM

## 2017-03-30 NOTE — Progress Notes (Signed)
Patient here for followup for Small Cell lung cancer. She reports one episode of nausea this morning after taking her decadron on an empty stomach. She took a zofran and the nausea resolved.

## 2017-03-31 ENCOUNTER — Inpatient Hospital Stay: Payer: Medicare HMO

## 2017-03-31 VITALS — BP 148/70 | HR 72 | Temp 97.0°F | Resp 20

## 2017-03-31 DIAGNOSIS — C7951 Secondary malignant neoplasm of bone: Secondary | ICD-10-CM | POA: Diagnosis not present

## 2017-03-31 DIAGNOSIS — R569 Unspecified convulsions: Secondary | ICD-10-CM | POA: Diagnosis not present

## 2017-03-31 DIAGNOSIS — I129 Hypertensive chronic kidney disease with stage 1 through stage 4 chronic kidney disease, or unspecified chronic kidney disease: Secondary | ICD-10-CM | POA: Diagnosis not present

## 2017-03-31 DIAGNOSIS — C7801 Secondary malignant neoplasm of right lung: Secondary | ICD-10-CM | POA: Diagnosis not present

## 2017-03-31 DIAGNOSIS — C7931 Secondary malignant neoplasm of brain: Secondary | ICD-10-CM | POA: Diagnosis not present

## 2017-03-31 DIAGNOSIS — R0981 Nasal congestion: Secondary | ICD-10-CM | POA: Diagnosis not present

## 2017-03-31 DIAGNOSIS — R609 Edema, unspecified: Secondary | ICD-10-CM | POA: Diagnosis not present

## 2017-03-31 DIAGNOSIS — N183 Chronic kidney disease, stage 3 (moderate): Secondary | ICD-10-CM | POA: Diagnosis not present

## 2017-03-31 DIAGNOSIS — C3411 Malignant neoplasm of upper lobe, right bronchus or lung: Secondary | ICD-10-CM | POA: Diagnosis not present

## 2017-03-31 DIAGNOSIS — E1122 Type 2 diabetes mellitus with diabetic chronic kidney disease: Secondary | ICD-10-CM | POA: Diagnosis not present

## 2017-03-31 DIAGNOSIS — R05 Cough: Secondary | ICD-10-CM | POA: Diagnosis not present

## 2017-03-31 DIAGNOSIS — C787 Secondary malignant neoplasm of liver and intrahepatic bile duct: Secondary | ICD-10-CM | POA: Diagnosis not present

## 2017-03-31 DIAGNOSIS — S14104S Unspecified injury at C4 level of cervical spinal cord, sequela: Secondary | ICD-10-CM | POA: Diagnosis not present

## 2017-03-31 DIAGNOSIS — G8252 Quadriplegia, C1-C4 incomplete: Secondary | ICD-10-CM | POA: Diagnosis not present

## 2017-03-31 DIAGNOSIS — C3490 Malignant neoplasm of unspecified part of unspecified bronchus or lung: Secondary | ICD-10-CM | POA: Diagnosis not present

## 2017-03-31 DIAGNOSIS — C3491 Malignant neoplasm of unspecified part of right bronchus or lung: Secondary | ICD-10-CM

## 2017-03-31 DIAGNOSIS — Z5111 Encounter for antineoplastic chemotherapy: Secondary | ICD-10-CM | POA: Diagnosis not present

## 2017-03-31 DIAGNOSIS — C7802 Secondary malignant neoplasm of left lung: Secondary | ICD-10-CM | POA: Diagnosis not present

## 2017-03-31 MED ORDER — SODIUM CHLORIDE 0.9% FLUSH
10.0000 mL | INTRAVENOUS | Status: DC | PRN
  Administered 2017-03-31: 10 mL
  Filled 2017-03-31: qty 10

## 2017-03-31 MED ORDER — DEXAMETHASONE SODIUM PHOSPHATE 10 MG/ML IJ SOLN
10.0000 mg | Freq: Once | INTRAMUSCULAR | Status: AC
  Administered 2017-03-31: 10 mg via INTRAVENOUS
  Filled 2017-03-31: qty 1

## 2017-03-31 MED ORDER — SODIUM CHLORIDE 0.9 % IV SOLN
Freq: Once | INTRAVENOUS | Status: AC
  Administered 2017-03-31: 14:00:00 via INTRAVENOUS
  Filled 2017-03-31: qty 1000

## 2017-03-31 MED ORDER — HEPARIN SOD (PORK) LOCK FLUSH 100 UNIT/ML IV SOLN
500.0000 [IU] | Freq: Once | INTRAVENOUS | Status: AC | PRN
  Administered 2017-03-31: 500 [IU]
  Filled 2017-03-31: qty 5

## 2017-03-31 MED ORDER — SODIUM CHLORIDE 0.9 % IV SOLN
100.0000 mg/m2 | Freq: Once | INTRAVENOUS | Status: AC
  Administered 2017-03-31: 170 mg via INTRAVENOUS
  Filled 2017-03-31: qty 8.5

## 2017-04-01 ENCOUNTER — Inpatient Hospital Stay: Payer: Medicare HMO

## 2017-04-01 VITALS — BP 141/71 | HR 77 | Temp 96.0°F | Resp 20

## 2017-04-01 DIAGNOSIS — C3411 Malignant neoplasm of upper lobe, right bronchus or lung: Secondary | ICD-10-CM | POA: Diagnosis not present

## 2017-04-01 DIAGNOSIS — R609 Edema, unspecified: Secondary | ICD-10-CM | POA: Diagnosis not present

## 2017-04-01 DIAGNOSIS — C3491 Malignant neoplasm of unspecified part of right bronchus or lung: Secondary | ICD-10-CM

## 2017-04-01 DIAGNOSIS — C7801 Secondary malignant neoplasm of right lung: Secondary | ICD-10-CM | POA: Diagnosis not present

## 2017-04-01 DIAGNOSIS — R569 Unspecified convulsions: Secondary | ICD-10-CM | POA: Diagnosis not present

## 2017-04-01 DIAGNOSIS — Z5111 Encounter for antineoplastic chemotherapy: Secondary | ICD-10-CM | POA: Diagnosis not present

## 2017-04-01 DIAGNOSIS — R05 Cough: Secondary | ICD-10-CM | POA: Diagnosis not present

## 2017-04-01 DIAGNOSIS — C7802 Secondary malignant neoplasm of left lung: Secondary | ICD-10-CM | POA: Diagnosis not present

## 2017-04-01 DIAGNOSIS — C7931 Secondary malignant neoplasm of brain: Secondary | ICD-10-CM | POA: Diagnosis not present

## 2017-04-01 DIAGNOSIS — R0981 Nasal congestion: Secondary | ICD-10-CM | POA: Diagnosis not present

## 2017-04-01 MED ORDER — HEPARIN SOD (PORK) LOCK FLUSH 100 UNIT/ML IV SOLN
500.0000 [IU] | Freq: Once | INTRAVENOUS | Status: AC | PRN
  Administered 2017-04-01: 500 [IU]

## 2017-04-01 MED ORDER — SODIUM CHLORIDE 0.9 % IV SOLN
Freq: Once | INTRAVENOUS | Status: AC
  Administered 2017-04-01: 14:00:00 via INTRAVENOUS
  Filled 2017-04-01: qty 1000

## 2017-04-01 MED ORDER — PEGFILGRASTIM 6 MG/0.6ML ~~LOC~~ PSKT
6.0000 mg | PREFILLED_SYRINGE | Freq: Once | SUBCUTANEOUS | Status: AC
  Administered 2017-04-01: 6 mg via SUBCUTANEOUS
  Filled 2017-04-01: qty 0.6

## 2017-04-01 MED ORDER — DEXAMETHASONE SODIUM PHOSPHATE 10 MG/ML IJ SOLN
10.0000 mg | Freq: Once | INTRAMUSCULAR | Status: AC
  Administered 2017-04-01: 10 mg via INTRAVENOUS
  Filled 2017-04-01: qty 1

## 2017-04-01 MED ORDER — SODIUM CHLORIDE 0.9 % IV SOLN
100.0000 mg/m2 | Freq: Once | INTRAVENOUS | Status: AC
  Administered 2017-04-01: 170 mg via INTRAVENOUS
  Filled 2017-04-01: qty 8.5

## 2017-04-02 DIAGNOSIS — C787 Secondary malignant neoplasm of liver and intrahepatic bile duct: Secondary | ICD-10-CM | POA: Diagnosis not present

## 2017-04-02 DIAGNOSIS — C3490 Malignant neoplasm of unspecified part of unspecified bronchus or lung: Secondary | ICD-10-CM | POA: Diagnosis not present

## 2017-04-02 DIAGNOSIS — C7931 Secondary malignant neoplasm of brain: Secondary | ICD-10-CM | POA: Diagnosis not present

## 2017-04-02 DIAGNOSIS — S14104S Unspecified injury at C4 level of cervical spinal cord, sequela: Secondary | ICD-10-CM | POA: Diagnosis not present

## 2017-04-02 DIAGNOSIS — C7951 Secondary malignant neoplasm of bone: Secondary | ICD-10-CM | POA: Diagnosis not present

## 2017-04-02 DIAGNOSIS — I129 Hypertensive chronic kidney disease with stage 1 through stage 4 chronic kidney disease, or unspecified chronic kidney disease: Secondary | ICD-10-CM | POA: Diagnosis not present

## 2017-04-02 DIAGNOSIS — N183 Chronic kidney disease, stage 3 (moderate): Secondary | ICD-10-CM | POA: Diagnosis not present

## 2017-04-02 DIAGNOSIS — G8252 Quadriplegia, C1-C4 incomplete: Secondary | ICD-10-CM | POA: Diagnosis not present

## 2017-04-02 DIAGNOSIS — E1122 Type 2 diabetes mellitus with diabetic chronic kidney disease: Secondary | ICD-10-CM | POA: Diagnosis not present

## 2017-04-03 ENCOUNTER — Telehealth: Payer: Self-pay | Admitting: *Deleted

## 2017-04-03 MED ORDER — DOXYCYCLINE HYCLATE 100 MG PO TABS: 100.0000 mg | ORAL_TABLET | Freq: Two times a day (BID) | ORAL | 0 refills | Status: DC

## 2017-04-03 NOTE — Telephone Encounter (Signed)
Tammie Buchanan informed of abx refilled and to refill the Dexamethasone. She stated she will get them both

## 2017-04-03 NOTE — Telephone Encounter (Signed)
Daughter called to report that she finished her abx 2 days ago and has again started to cough and has become very congested again. Asking if you would refill her abx. Also wanted you to know that she had another seizure last night and is asking if she should get the refill on her steroids filled because nothing was said about refilling it. Please advise

## 2017-04-03 NOTE — Telephone Encounter (Signed)
Yes and yes. Same doses.  Thank you.

## 2017-04-07 DIAGNOSIS — G8252 Quadriplegia, C1-C4 incomplete: Secondary | ICD-10-CM | POA: Diagnosis not present

## 2017-04-07 DIAGNOSIS — C7931 Secondary malignant neoplasm of brain: Secondary | ICD-10-CM | POA: Diagnosis not present

## 2017-04-07 DIAGNOSIS — C3490 Malignant neoplasm of unspecified part of unspecified bronchus or lung: Secondary | ICD-10-CM | POA: Diagnosis not present

## 2017-04-07 DIAGNOSIS — C7951 Secondary malignant neoplasm of bone: Secondary | ICD-10-CM | POA: Diagnosis not present

## 2017-04-07 DIAGNOSIS — I129 Hypertensive chronic kidney disease with stage 1 through stage 4 chronic kidney disease, or unspecified chronic kidney disease: Secondary | ICD-10-CM | POA: Diagnosis not present

## 2017-04-07 DIAGNOSIS — E1122 Type 2 diabetes mellitus with diabetic chronic kidney disease: Secondary | ICD-10-CM | POA: Diagnosis not present

## 2017-04-07 DIAGNOSIS — S14104S Unspecified injury at C4 level of cervical spinal cord, sequela: Secondary | ICD-10-CM | POA: Diagnosis not present

## 2017-04-07 DIAGNOSIS — C787 Secondary malignant neoplasm of liver and intrahepatic bile duct: Secondary | ICD-10-CM | POA: Diagnosis not present

## 2017-04-07 DIAGNOSIS — N183 Chronic kidney disease, stage 3 (moderate): Secondary | ICD-10-CM | POA: Diagnosis not present

## 2017-04-09 DIAGNOSIS — E1122 Type 2 diabetes mellitus with diabetic chronic kidney disease: Secondary | ICD-10-CM | POA: Diagnosis not present

## 2017-04-09 DIAGNOSIS — S14104S Unspecified injury at C4 level of cervical spinal cord, sequela: Secondary | ICD-10-CM | POA: Diagnosis not present

## 2017-04-09 DIAGNOSIS — G8252 Quadriplegia, C1-C4 incomplete: Secondary | ICD-10-CM | POA: Diagnosis not present

## 2017-04-09 DIAGNOSIS — C7931 Secondary malignant neoplasm of brain: Secondary | ICD-10-CM | POA: Diagnosis not present

## 2017-04-09 DIAGNOSIS — I129 Hypertensive chronic kidney disease with stage 1 through stage 4 chronic kidney disease, or unspecified chronic kidney disease: Secondary | ICD-10-CM | POA: Diagnosis not present

## 2017-04-09 DIAGNOSIS — C3490 Malignant neoplasm of unspecified part of unspecified bronchus or lung: Secondary | ICD-10-CM | POA: Diagnosis not present

## 2017-04-09 DIAGNOSIS — C787 Secondary malignant neoplasm of liver and intrahepatic bile duct: Secondary | ICD-10-CM | POA: Diagnosis not present

## 2017-04-09 DIAGNOSIS — N183 Chronic kidney disease, stage 3 (moderate): Secondary | ICD-10-CM | POA: Diagnosis not present

## 2017-04-09 DIAGNOSIS — C7951 Secondary malignant neoplasm of bone: Secondary | ICD-10-CM | POA: Diagnosis not present

## 2017-04-12 ENCOUNTER — Inpatient Hospital Stay
Admission: EM | Admit: 2017-04-12 | Discharge: 2017-04-13 | DRG: 101 | Disposition: A | Discharge status: Home / Self Care | Payer: Medicare Other | Attending: Specialist | Admitting: Specialist

## 2017-04-12 ENCOUNTER — Emergency Department: Payer: Medicare Other

## 2017-04-12 ENCOUNTER — Other Ambulatory Visit: Payer: Self-pay

## 2017-04-12 DIAGNOSIS — Z833 Family history of diabetes mellitus: Secondary | ICD-10-CM

## 2017-04-12 DIAGNOSIS — Z90710 Acquired absence of both cervix and uterus: Secondary | ICD-10-CM

## 2017-04-12 DIAGNOSIS — Z881 Allergy status to other antibiotic agents status: Secondary | ICD-10-CM | POA: Diagnosis not present

## 2017-04-12 DIAGNOSIS — I129 Hypertensive chronic kidney disease with stage 1 through stage 4 chronic kidney disease, or unspecified chronic kidney disease: Secondary | ICD-10-CM | POA: Diagnosis not present

## 2017-04-12 DIAGNOSIS — C3490 Malignant neoplasm of unspecified part of unspecified bronchus or lung: Secondary | ICD-10-CM | POA: Diagnosis not present

## 2017-04-12 DIAGNOSIS — Z886 Allergy status to analgesic agent status: Secondary | ICD-10-CM

## 2017-04-12 DIAGNOSIS — M810 Age-related osteoporosis without current pathological fracture: Secondary | ICD-10-CM | POA: Diagnosis not present

## 2017-04-12 DIAGNOSIS — Z7982 Long term (current) use of aspirin: Secondary | ICD-10-CM | POA: Diagnosis not present

## 2017-04-12 DIAGNOSIS — Z888 Allergy status to other drugs, medicaments and biological substances status: Secondary | ICD-10-CM | POA: Diagnosis not present

## 2017-04-12 DIAGNOSIS — R569 Unspecified convulsions: Secondary | ICD-10-CM | POA: Diagnosis not present

## 2017-04-12 DIAGNOSIS — I10 Essential (primary) hypertension: Secondary | ICD-10-CM | POA: Diagnosis present

## 2017-04-12 DIAGNOSIS — C7931 Secondary malignant neoplasm of brain: Secondary | ICD-10-CM | POA: Diagnosis present

## 2017-04-12 DIAGNOSIS — Z923 Personal history of irradiation: Secondary | ICD-10-CM | POA: Diagnosis not present

## 2017-04-12 DIAGNOSIS — Z87891 Personal history of nicotine dependence: Secondary | ICD-10-CM | POA: Diagnosis not present

## 2017-04-12 DIAGNOSIS — G729 Myopathy, unspecified: Secondary | ICD-10-CM | POA: Diagnosis present

## 2017-04-12 DIAGNOSIS — Z79891 Long term (current) use of opiate analgesic: Secondary | ICD-10-CM | POA: Diagnosis not present

## 2017-04-12 DIAGNOSIS — N183 Chronic kidney disease, stage 3 unspecified: Secondary | ICD-10-CM | POA: Diagnosis present

## 2017-04-12 DIAGNOSIS — Z885 Allergy status to narcotic agent status: Secondary | ICD-10-CM

## 2017-04-12 DIAGNOSIS — M109 Gout, unspecified: Secondary | ICD-10-CM | POA: Diagnosis present

## 2017-04-12 DIAGNOSIS — Z79899 Other long term (current) drug therapy: Secondary | ICD-10-CM

## 2017-04-12 DIAGNOSIS — Z808 Family history of malignant neoplasm of other organs or systems: Secondary | ICD-10-CM

## 2017-04-12 DIAGNOSIS — G40909 Epilepsy, unspecified, not intractable, without status epilepticus: Secondary | ICD-10-CM | POA: Diagnosis not present

## 2017-04-12 DIAGNOSIS — R55 Syncope and collapse: Secondary | ICD-10-CM | POA: Diagnosis not present

## 2017-04-12 DIAGNOSIS — E119 Type 2 diabetes mellitus without complications: Secondary | ICD-10-CM

## 2017-04-12 DIAGNOSIS — E785 Hyperlipidemia, unspecified: Secondary | ICD-10-CM | POA: Diagnosis present

## 2017-04-12 DIAGNOSIS — E1122 Type 2 diabetes mellitus with diabetic chronic kidney disease: Secondary | ICD-10-CM | POA: Diagnosis not present

## 2017-04-12 DIAGNOSIS — R0600 Dyspnea, unspecified: Secondary | ICD-10-CM | POA: Diagnosis not present

## 2017-04-12 LAB — COMPREHENSIVE METABOLIC PANEL
ALT: 21 U/L (ref 14–54)
ANION GAP: 9 (ref 5–15)
AST: 27 U/L (ref 15–41)
Albumin: 2.9 g/dL — ABNORMAL LOW (ref 3.5–5.0)
Alkaline Phosphatase: 110 U/L (ref 38–126)
BUN: 16 mg/dL (ref 6–20)
CHLORIDE: 99 mmol/L — AB (ref 101–111)
CO2: 27 mmol/L (ref 22–32)
Calcium: 6.6 mg/dL — ABNORMAL LOW (ref 8.9–10.3)
Creatinine, Ser: 0.84 mg/dL (ref 0.44–1.00)
GFR calc Af Amer: >60 mL/min (ref >60)
Glucose, Bld: 139 mg/dL — ABNORMAL HIGH (ref 65–99)
Potassium: 3.9 mmol/L (ref 3.5–5.1)
Sodium: 135 mmol/L (ref 135–145)
TOTAL PROTEIN: 5.6 g/dL — AB (ref 6.5–8.1)
Total Bilirubin: 0.5 mg/dL (ref 0.3–1.2)

## 2017-04-12 LAB — URINALYSIS, COMPLETE (UACMP) WITH MICROSCOPIC
BILIRUBIN URINE: NEGATIVE
GLUCOSE, UA: NEGATIVE mg/dL
HGB URINE DIPSTICK: NEGATIVE
Ketones, ur: NEGATIVE mg/dL
LEUKOCYTES UA: NEGATIVE
NITRITE: NEGATIVE
PH: 6 (ref 5.0–8.0)
PROTEIN: NEGATIVE mg/dL
Specific Gravity, Urine: 1.005 (ref 1.005–1.030)

## 2017-04-12 LAB — TROPONIN I
TROPONIN I: 0.08 ng/mL — AB (ref <0.03)
Troponin I: 0.03 ng/mL (ref <0.03)

## 2017-04-12 LAB — CBC
HEMATOCRIT: 28.2 % — AB (ref 35.0–47.0)
Hemoglobin: 9.5 g/dL — ABNORMAL LOW (ref 12.0–16.0)
MCH: 31.6 pg (ref 26.0–34.0)
MCHC: 33.7 g/dL (ref 32.0–36.0)
MCV: 93.7 fL (ref 80.0–100.0)
PLATELETS: 85 10*3/uL — AB (ref 150–440)
RBC: 3.01 MIL/uL — ABNORMAL LOW (ref 3.80–5.20)
RDW: 19.2 % — AB (ref 11.5–14.5)
WBC: 22.6 10*3/uL — AB (ref 3.6–11.0)

## 2017-04-12 LAB — CK: Total CK: 25 U/L — ABNORMAL LOW (ref 38–234)

## 2017-04-12 MED ORDER — SODIUM CHLORIDE 0.9 % IV SOLN
1000.0000 mg | Freq: Once | INTRAVENOUS | Status: AC
  Administered 2017-04-12: 1000 mg via INTRAVENOUS
  Filled 2017-04-12: qty 10

## 2017-04-12 MED ORDER — ASPIRIN EC 81 MG PO TBEC
81.0000 mg | DELAYED_RELEASE_TABLET | Freq: Every day | ORAL | Status: DC
  Administered 2017-04-13: 81 mg via ORAL
  Filled 2017-04-12: qty 1

## 2017-04-12 MED ORDER — PANTOPRAZOLE SODIUM 40 MG PO TBEC
40.0000 mg | DELAYED_RELEASE_TABLET | Freq: Every day | ORAL | Status: DC
  Administered 2017-04-13: 40 mg via ORAL
  Filled 2017-04-12: qty 1

## 2017-04-12 MED ORDER — COLCHICINE 0.6 MG PO TABS
0.6000 mg | ORAL_TABLET | Freq: Two times a day (BID) | ORAL | Status: DC
  Administered 2017-04-12: 0.6 mg via ORAL
  Filled 2017-04-12: qty 1

## 2017-04-12 MED ORDER — DEXAMETHASONE 4 MG PO TABS
4.0000 mg | ORAL_TABLET | Freq: Every day | ORAL | Status: DC
Start: 2017-04-13 — End: 2017-04-13
  Administered 2017-04-13: 4 mg via ORAL
  Filled 2017-04-12: qty 1

## 2017-04-12 MED ORDER — PROBENECID 500 MG PO TABS
500.0000 mg | ORAL_TABLET | Freq: Two times a day (BID) | ORAL | Status: DC
  Administered 2017-04-12: 500 mg via ORAL
  Filled 2017-04-12 (×2): qty 1

## 2017-04-12 MED ORDER — LORATADINE 10 MG PO TABS
10.0000 mg | ORAL_TABLET | Freq: Every day | ORAL | Status: DC
  Filled 2017-04-12: qty 1

## 2017-04-12 MED ORDER — PROCHLORPERAZINE MALEATE 10 MG PO TABS
10.0000 mg | ORAL_TABLET | Freq: Four times a day (QID) | ORAL | Status: DC | PRN
  Filled 2017-04-12: qty 1

## 2017-04-12 MED ORDER — LORAZEPAM 2 MG/ML IJ SOLN: 2.0000 mg | Freq: Four times a day (QID) | INTRAMUSCULAR | Status: DC | PRN

## 2017-04-12 MED ORDER — POLYVINYL ALCOHOL 1.4 % OP SOLN
1.0000 [drp] | OPHTHALMIC | Status: DC | PRN
  Filled 2017-04-12: qty 15

## 2017-04-12 MED ORDER — CLONIDINE HCL 0.3 MG/24HR TD PTWK: 0.3000 mg | MEDICATED_PATCH | TRANSDERMAL | Status: DC

## 2017-04-12 MED ORDER — COLCHICINE-PROBENECID 0.5-500 MG PO TABS
1.0000 | ORAL_TABLET | Freq: Two times a day (BID) | ORAL | Status: DC
  Filled 2017-04-12: qty 1

## 2017-04-12 MED ORDER — MELATONIN 5 MG PO TABS
10.0000 mg | ORAL_TABLET | Freq: Every day | ORAL | Status: DC
  Administered 2017-04-12: 10 mg via ORAL
  Filled 2017-04-12 (×2): qty 2

## 2017-04-12 MED ORDER — AMLODIPINE BESYLATE 10 MG PO TABS
10.0000 mg | ORAL_TABLET | Freq: Every day | ORAL | Status: DC
  Administered 2017-04-12: 10 mg via ORAL
  Filled 2017-04-12: qty 1

## 2017-04-12 MED ORDER — CARVEDILOL 25 MG PO TABS
50.0000 mg | ORAL_TABLET | Freq: Two times a day (BID) | ORAL | Status: DC
  Administered 2017-04-13: 50 mg via ORAL
  Filled 2017-04-12 (×2): qty 2

## 2017-04-12 MED ORDER — GABAPENTIN 300 MG PO CAPS
300.0000 mg | ORAL_CAPSULE | Freq: Three times a day (TID) | ORAL | Status: DC
  Administered 2017-04-12 – 2017-04-13 (×2): 300 mg via ORAL
  Filled 2017-04-12 (×2): qty 1

## 2017-04-12 MED ORDER — DOCUSATE SODIUM 100 MG PO CAPS
200.0000 mg | ORAL_CAPSULE | Freq: Every day | ORAL | Status: DC
  Administered 2017-04-12: 200 mg via ORAL
  Filled 2017-04-12: qty 2

## 2017-04-12 MED ORDER — LEVETIRACETAM 500 MG PO TABS
500.0000 mg | ORAL_TABLET | Freq: Two times a day (BID) | ORAL | Status: DC
  Administered 2017-04-12 – 2017-04-13 (×2): 500 mg via ORAL
  Filled 2017-04-12 (×2): qty 1

## 2017-04-12 NOTE — ED Notes (Signed)
Pt daughter states pt face is swollen.

## 2017-04-12 NOTE — Progress Notes (Signed)
Pt. Arrived via stretcher, staff transferred to bed. Pt alert and oriented x4. Tele applied, verified with CCMD with Delrae Rend, RN. Skin assessment completed with Delrae Rend, RN. Bruising BUE/BLE scattered. Skin warm, dry and flaky. General room orientation given. Instruction on how to use ascom and call bell system given.

## 2017-04-12 NOTE — ED Notes (Signed)
Pt has power port 

## 2017-04-12 NOTE — ED Provider Notes (Signed)
[facility] Emergency Department Provider Note   ____________________________________________    I have reviewed the triage vital signs and the nursing notes.   HISTORY  Chief Complaint Seizures and Loss of Consciousness     HPI Tammie Buchanan is a 76 y.o. 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 Patient reports 4 times over the last 6 weeks she has had episodes of tremoring of her left arm which are uncontrollable and severe. Today she describes an episode where her arm was tremoring so hard she asked her son to hold it but he said it wasn't moving. Apparently she became lightheaded and syncopized which is the first time this is happened. She denies chest pain. No headache. No new weakness. No fevers or chills. Abdominal pain. She has chemotherapy scheduled in 2 weeks she has had radiation over a month ago.   Past Medical History:  Diagnosis Date  . Brain cancer ([place])   . Diabetes mellitus without complication ([place])   . Hypertension   . Lung cancer [facility])     Patient Active Problem List   Diagnosis Date Noted  . Small cell lung cancer ([place]) 01/29/2017  . Brain metastasis ([place]) 01/07/2017  . Lung metastases ([place]) 01/03/2017  . Right frontal lobe mass 01/02/2017  . 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 DNR (do not resuscitate) 04/14/2016  . Tobacco abuse 12/12/2015  . Pain in right eye 04/24/2015  . GERD (gastroesophageal reflux disease) 08/15/2013  . Hyperlipidemia 08/15/2013  . Diabetes mellitus, type II ([place]) 03/09/2013  . Gout 03/03/2013  . 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 Zoster 02/16/2013  . Chronic kidney disease, stage III (moderate) 10/03/2012  . Degenerative drusen 08/30/2012  . Status post cataract extraction 08/26/2011  . Transient cerebral ischemia 02/19/2010  . Osteoporosis  09/25/2009  . Type II diabetes mellitus ([place]) 11/24/2008  . Essential hypertension 01/27/1997    Past Surgical History:  Procedure Laterality Date  . ABDOMINAL HYSTERECTOMY     partial  . APPENDECTOMY    . PORTA CATH INSERTION N/A 02/04/2017   Procedure: [name];  Surgeon: Algernon Huxley, MD;  Location: [place];  Service: Cardiovascular;  Laterality: N/A;    Prior to Admission medications   Medication Sig Start Date End Date Taking? Authorizing Provider  acetaminophen (TYLENOL) 500 MG tablet Take 500 mg by mouth 2 (two) times daily as needed for moderate pain.    [provider]  alendronate (FOSAMAX) 70 MG tablet Take 70 mg by mouth every Friday.  07/10/16   [provider]  amLODipine (NORVASC) 10 MG tablet Take 10 mg by mouth at bedtime.  01/21/17 01/21/18  [provider]  aspirin EC 81 MG tablet Take 81 mg by mouth daily.  09/20/14   [provider]  Calcium Carb-Cholecalciferol 600-800 MG-UNIT TABS Take 1 tablet by mouth daily.  07/10/16   [provider]  carvedilol (COREG) 25 MG tablet Take 50 mg by mouth 2 (two) times daily with a meal.  01/20/17 03/31/19  [provider]  cetirizine (ZYRTEC) 10 MG tablet Take 10 mg by mouth at bedtime.     [provider]  cholecalciferol (VITAMIN D) 400 units TABS tablet Take 400 Units by mouth daily.    [provider]  cloNIDine (CATAPRES - DOSED IN MG/24 HR) 0.3 mg/24hr patch Place 0.3  mg onto the skin once a week. Change patch on Wednesdays 01/21/17 03/30/37  [provider]  colchicine-probenecid 0.5-500 MG tablet Take 1 tablet by mouth 2 (two) times daily.  07/10/16 07/10/17  [provider]  dexamethasone (DECADRON) 4 MG tablet Take 1 tablet (4 mg total) by mouth daily. 03/02/17   Lloyd Huger, MD  docusate sodium (COLACE) 100 MG capsule Take 400 mg by mouth at bedtime.     [provider]  doxycycline (VIBRA-TABS) 100 MG  tablet Take 1 tablet (100 mg total) by mouth 2 (two) times daily. 04/03/17   Lloyd Huger, MD  erythromycin ophthalmic ointment Apply to eyelid margins every night 07/13/15   [provider]  esomeprazole (NEXIUM) 40 MG capsule Take 40 mg by mouth daily.  07/10/16 07/10/17  [provider]  gabapentin (NEURONTIN) 300 MG capsule Take 300 mg by mouth 3 (three) times daily.  01/20/17 03/30/37  [provider]  Hypromellose (ARTIFICIAL TEARS OP) Apply 1 drop to eye daily as needed (dry eyes).    [provider]  Melatonin 10 MG TABS Take 20 mg by mouth at bedtime.    [provider]  ondansetron (ZOFRAN) 8 MG tablet Take 1 tablet (8 mg total) by mouth 2 (two) times daily as needed for refractory nausea / vomiting. 01/31/17   Lloyd Huger, MD  prochlorperazine (COMPAZINE) 10 MG tablet Take 1 tablet (10 mg total) by mouth every 6 (six) hours as needed (Nausea or vomiting). 01/31/17   Lloyd Huger, MD  simvastatin (ZOCOR) 20 MG tablet Take 20 mg by mouth every evening.  12/05/16   [provider]     Allergies Ace inhibitors; Allopurinol; Angiotensin receptor blockers; Codeine; Enalapril; Furosemide; Hydralazine; Hydrochlorothiazide; Irbesartan; Levofloxacin; Lidocaine; Naproxen sodium; Spironolactone; and Tramadol  Family History  Problem Relation Age of Onset  . Diabetes Mother   . Melanoma Sister   . Ovarian cancer Sister   . Diabetes Sister   . Diabetes Brother   . Diabetes Maternal Grandmother   . Diabetes Paternal Grandmother     Social History Social History  Substance Use Topics  . Smoking status: Former Research scientist (life sciences)  . Smokeless tobacco: Never Used  . Alcohol use No    Review of Systems  Constitutional: No fever/chills Eyes: No visual changes.  ENT: No sore throat. Cardiovascular: Denies chest pain. Respiratory: Denies shortness of breath. Gastrointestinal: No abdominal pain.  No nausea, no vomiting.   Genitourinary:  Negative for dysuria. Musculoskeletal: Negative for back pain. Skin: Negative for rash. Neurological: Negative for new Weakness   ____________________________________________   PHYSICAL EXAM:  VITAL SIGNS: ED Triage Vitals  Enc Vitals Group     BP 04/12/17 1522 (!) 154/72     Pulse Rate 04/12/17 1522 92     Resp 04/12/17 1522 (!) 24     Temp 04/12/17 1522 98.1 F (36.7 C)     Temp Source 04/12/17 1522 Oral     SpO2 04/12/17 1522 95 %     Weight 04/12/17 1526 61.2 kg (135 lb)     Height 04/12/17 1526 1.626 m (5\' 4" )     Head Circumference --      Peak Flow --      Pain Score --      Pain Loc --      Pain Edu? --      Excl. in [place]? --    Constitutional: Alert and oriented. No acute distress. Pleasant and interactive Eyes: Conjunctivae  are normal.  Head: Atraumatic. Nose: No congestion/rhinnorhea. Mouth/Throat: Mucous membranes are moist.   Neck:  Painless ROM Cardiovascular: Normal rate, regular rhythm. Grossly normal heart sounds.  Good peripheral circulation. Respiratory: Normal respiratory effort.  No retractions. Lungs CTAB. Gastrointestinal: Soft and nontender. No distention.  No CVA tenderness. Genitourinary: deferred Musculoskeletal: No lower extremity tenderness nor edema.  Warm and well perfused Neurologic:  Normal speech and language. Patient with some weakness in the left arm which is apparently chronic. Skin:  Skin is warm, dry and intact. No rash noted. Psychiatric: Mood and affect are normal. Speech and behavior are normal.  ____________________________________________   LABS (all labs ordered are listed, but only abnormal results are displayed)  Labs Reviewed  CBC - Abnormal; Notable for the following:       Result Value   WBC 22.6 (*)    RBC 3.01 (*)    Hemoglobin 9.5 (*)    HCT 28.2 (*)    RDW 19.2 (*)    Platelets 85 (*)    All other components within normal limits  COMPREHENSIVE METABOLIC PANEL - Abnormal; Notable for the following:     Chloride 99 (*)    Glucose, Bld 139 (*)    Calcium 6.6 (*)    Total Protein 5.6 (*)    Albumin 2.9 (*)    All other components within normal limits  TROPONIN I - Abnormal; Notable for the following:    Troponin I 0.08 (*)    All other components within normal limits  URINALYSIS, COMPLETE (UACMP) WITH MICROSCOPIC - Abnormal; Notable for the following:    Color, Urine STRAW (*)    APPearance CLEAR (*)    Bacteria, UA RARE (*)    Squamous Epithelial / LPF 0-5 (*)    All other components within normal limits   ____________________________________________  EKG  ED ECG REPORT I, Lavonia Drafts, the attending physician, personally viewed and interpreted this ECG.  Date: 04/12/2017 EKG Time: 3:31 PM Rate: 79 Rhythm: normal sinus rhythm QRS Axis: normal Intervals: normal ST/T Wave abnormalities: normal Narrative Interpretation: unremarkable  ____________________________________________  RADIOLOGY  Ct head nad ____________________________________________   PROCEDURES  Procedure(s) performed: No    Critical Care performed: No ____________________________________________   INITIAL IMPRESSION / ASSESSMENT AND PLAN / ED COURSE  Pertinent labs & imaging results that were available during my care of the patient were reviewed by me and considered in my medical decision making (see chart for details).  Patient presents after complaints of tremoring/syncopal episode as detailed above. She was aware during this episode, possible focal seizure? We'll obtain imaging/labs, reevaluate  Daughter is here now and reports that the patient started with focal tremors of the left arm but then proceeded to have a generalized tonic-clonic seizure. I suspect this is the cause of her elevated troponin is EKG is reassuring.   Discussed this with neurology at [facility], they recommend 1 g of Keppra IV and then 500 mg twice a day as well as spot EEG.  Will admit to hospitalist  service ____________________________________________   FINAL CLINICAL IMPRESSION(S) / ED DIAGNOSES  Final diagnoses:  Seizures ([place])      NEW MEDICATIONS STARTED DURING THIS VISIT:  New Prescriptions   No medications on file     Note:  This document was prepared using Dragon voice recognition software and may include unintentional dictation errors.    Lavonia Drafts, MD 04/12/17 [phone]

## 2017-04-12 NOTE — ED Triage Notes (Signed)
Per EMS family called due to pt shaking left arm and then passing out.  Pt has had 4 seizures previous to today but has not every lost consciousness.

## 2017-04-12 NOTE — ED Notes (Signed)
Sandwich tray given

## 2017-04-12 NOTE — ED Notes (Signed)
Port accessed by [employer], Therapist, sports

## 2017-04-12 NOTE — H&P (Addendum)
PCP:   [name], NP   Chief Complaint:  seizure  HPI: This is a 76 year old female with history of lung cancer and brain metastases. This was diagnosed in May 1. She's had 10 rounds of brain radiation ending March 26. Since her radiation she started having localized seizures usually localized to the left arm. Today she had a seizure that was tonic-clonic, lasting approximately 5 minutes. This is her fourth seizure. She decided to come to the ER after she started having additional myoclonic jerks around her eyes. Patient denies any fever or vomiting. She is on chemotherapy every 3 weeks. She is scheduled to have her fourth round of chemotherapy next week. History provided by daughter. The patient has had no seizures in the ER.  Review of Systems:  The patient denies anorexia, fever, weight loss,, vision loss, decreased hearing, hoarseness, chest pain, syncope, dyspnea on exertion, peripheral edema, balance deficits, hemoptysis, abdominal pain, melena, hematochezia, severe indigestion/heartburn, hematuria, incontinence, genital sores, muscle weakness, suspicious skin lesions, seizures, transient blindness, difficulty walking, depression, unusual weight change, abnormal bleeding, enlarged lymph nodes, angioedema, and breast masses.  Past Medical History: Past Medical History:  Diagnosis Date  . Brain cancer ([place])   . Diabetes mellitus without complication ([place])   . Hypertension   . Lung cancer [facility])    Past Surgical History:  Procedure Laterality Date  . ABDOMINAL HYSTERECTOMY     partial  . APPENDECTOMY    . PORTA CATH INSERTION N/A 02/04/2017   Procedure: [name];  Surgeon: Algernon Huxley, MD;  Location: [place];  Service: Cardiovascular;  Laterality: N/A;    Medications: Prior to Admission medications   Medication Sig Start Date End Date Taking? Authorizing Provider  acetaminophen (TYLENOL) 500 MG tablet Take 500 mg by mouth 2 (two) times daily as needed  for moderate pain.   Yes [provider]  alendronate (FOSAMAX) 70 MG tablet Take 70 mg by mouth every Friday.  07/10/16  Yes [provider]  amLODipine (NORVASC) 10 MG tablet Take 10 mg by mouth at bedtime.  01/21/17 01/21/18 Yes [provider]  aspirin EC 81 MG tablet Take 81 mg by mouth daily.  09/20/14  Yes [provider]  Calcium Carb-Cholecalciferol 600-800 MG-UNIT TABS Take 1 tablet by mouth daily.  07/10/16  Yes [provider]  carvedilol (COREG) 25 MG tablet Take 50 mg by mouth 2 (two) times daily with a meal.  01/20/17 03/31/19 Yes [provider]  cetirizine (ZYRTEC) 10 MG tablet Take 10 mg by mouth at bedtime.    Yes [provider]  colchicine-probenecid 0.5-500 MG tablet Take 1 tablet by mouth 2 (two) times daily.  07/10/16 07/10/17 Yes [provider]  dexamethasone (DECADRON) 4 MG tablet Take 1 tablet (4 mg total) by mouth daily. 03/02/17  Yes Lloyd Huger, MD  docusate sodium (COLACE) 100 MG capsule Take 200-400 mg by mouth at bedtime.    Yes [provider]  erythromycin ophthalmic ointment Apply to eyelid margins every night 07/13/15  Yes [provider]  esomeprazole (NEXIUM) 40 MG capsule Take 40 mg by mouth daily.  07/10/16 07/10/17 Yes [provider]  gabapentin (NEURONTIN) 300 MG capsule Take 300 mg by mouth 3 (three) times daily.  01/20/17 03/30/37 Yes [provider]  Hypromellose (ARTIFICIAL TEARS OP) Apply 1 drop to eye daily as needed (dry eyes).   Yes [provider]  Melatonin 10 MG TABS Take 20 mg by mouth  at bedtime.   Yes [provider]  ondansetron (ZOFRAN) 8 MG tablet Take 1 tablet (8 mg total) by mouth 2 (two) times daily as needed for refractory nausea / vomiting. 01/31/17  Yes Lloyd Huger, MD  prochlorperazine (COMPAZINE) 10 MG tablet Take 1 tablet (10 mg total) by mouth every 6 (six) hours as needed (Nausea or vomiting). 01/31/17  Yes  Lloyd Huger, MD  simvastatin (ZOCOR) 20 MG tablet Take 20 mg by mouth every evening.  12/05/16  Yes [provider]  cloNIDine (CATAPRES - DOSED IN MG/24 HR) 0.3 mg/24hr patch Place 0.3 mg onto the skin once a week. Change patch on Wednesdays 01/21/17 03/30/37  [provider]    Allergies:   Allergies  Allergen Reactions  . Ace Inhibitors Anaphylaxis  . Allopurinol Anaphylaxis  . Angiotensin Receptor Blockers Anaphylaxis  . Codeine Anaphylaxis  . Enalapril Swelling    Other reaction(s): SWELLING/EDEMA  . Furosemide Swelling    Other reaction(s): OTHER  . Hydralazine Swelling    Other reaction(s): SWELLING/EDEMA  . Hydrochlorothiazide Anaphylaxis  . Irbesartan Swelling    Other reaction(s): SWELLING/EDEMA  . Levofloxacin Swelling    Other reaction(s): SWELLING/EDEMA  . Lidocaine Swelling    Other reaction(s): SWELLING/EDEMA  . Naproxen Sodium Anaphylaxis  . Spironolactone Anaphylaxis  . Tramadol Anaphylaxis    Other reaction(s): NAUSEA    Social History:  reports that she has quit smoking. She has never used smokeless tobacco. She reports that she does not drink alcohol or use drugs.  Family History: Family History  Problem Relation Age of Onset  . Diabetes Mother   . Melanoma Sister   . Ovarian cancer Sister   . Diabetes Sister   . Diabetes Brother   . Diabetes Maternal Grandmother   . Diabetes Paternal Grandmother     Physical Exam: Vitals:   04/12/17 1530 04/12/17 1600 04/12/17 1700 04/12/17 1730  BP: (!) 145/67 (!) 150/70 (!) 148/62 (!) 144/72  Pulse: 79 80 77 79  Resp: (!) 26 (!) 22 (!) 24 (!) 25  Temp:      TempSrc:      SpO2: 94% 93% (!) 89% 92%  Weight:      Height:        General:  Alert and oriented times three, Weak, frail appearing female, no acute distress Eyes: PERRLA, pink conjunctiva, no scleral icterus ENT: Moist oral mucosa, neck supple, no thyromegaly Lungs: clear to ascultation, no wheeze, no crackles, no use of  accessory muscles, Port right chest wall. Cardiovascular: regular rate and rhythm, no regurgitation, no gallops, no murmurs. No carotid bruits, no JVD Abdomen: soft, positive BS, non-tender, non-distended, no organomegaly, not an acute abdomen GU: not examined Neuro: CN II - XII grossly intact, sensation intact Musculoskeletal: strength 5/5 all extremities, no clubbing, cyanosis or edema Skin: no rash, no subcutaneous crepitation, no decubitus Psych: appropriate patient   Labs on Admission:   Recent Labs  04/12/17 1523  NA 135  K 3.9  CL 99*  CO2 27  GLUCOSE 139*  BUN 16  CREATININE 0.84  CALCIUM 6.6*    Recent Labs  04/12/17 1523  AST 27  ALT 21  ALKPHOS 110  BILITOT 0.5  PROT 5.6*  ALBUMIN 2.9*   No results for input(s): LIPASE, AMYLASE in the last 72 hours.  Recent Labs  04/12/17 1523  WBC 22.6*  HGB 9.5*  HCT 28.2*  MCV 93.7  PLT 85*    Recent Labs  04/12/17 1523  TROPONINI 0.08*   Invalid input(s): POCBNP No results for input(s): DDIMER in the last 72 hours. No results for input(s): HGBA1C in the last 72 hours. No results for input(s): CHOL, HDL, LDLCALC, TRIG, CHOLHDL, LDLDIRECT in the last 72 hours. No results for input(s): TSH, T4TOTAL, T3FREE, THYROIDAB in the last 72 hours.  Invalid input(s): FREET3 No results for input(s): VITAMINB12, FOLATE, FERRITIN, TIBC, IRON, RETICCTPCT in the last 72 hours.  Micro Results: No results found for this or any previous visit (from the past 240 hour(s)).   Radiological Exams on Admission: Ct Head Wo Contrast  Result Date: 04/12/2017 CLINICAL DATA:  Syncope he and left arm shaking. History of lung cancer and brain metastasis. EXAM: CT HEAD WITHOUT CONTRAST TECHNIQUE: Contiguous axial images were obtained from the base of the skull through the vertex without intravenous contrast. COMPARISON:  MRI brain 02/26/2017 FINDINGS: Brain: Stable cerebral atrophy, ventriculomegaly and periventricular white matter  disease. Stable treated metastatic lesion at the right vertex. No new lesions are identified. No findings for acute hemispheric infarction or intracranial hemorrhage. No extra-axial fluid collections. The brainstem and cerebellum are grossly normal in stable. Vascular: Stable vascular calcifications. No aneurysm or hyperdense vessels. Skull: No worrisome bone lesions or acute fracture. Sinuses/Orbits: The paranasal sinuses and mastoid air cells are clear. The globes are intact. Other: No scalp lesion or hematoma. IMPRESSION: 1. Stable area of treated metastatic disease in the right vertex area. 2. No new brain lesions are identified. No acute intracranial findings. 3. Stable cerebral atrophy, ventriculomegaly and periventricular white matter disease. Electronically Signed   By: Marijo Sanes M.D.   On: 04/12/2017 16:16   Dg Chest Port 1 View  Result Date: 04/12/2017 CLINICAL DATA:  Dyspnea EXAM: PORTABLE CHEST 1 VIEW COMPARISON:  09/29/2009 chest radiograph. FINDINGS: Right internal jugular MediPort terminates in the lower third of the superior vena cava. Stable cardiomediastinal silhouette with top-normal heart size and aortic atherosclerosis. No pneumothorax. No pleural effusion. Lungs appear clear, with no acute consolidative airspace disease and no pulmonary edema. IMPRESSION: No active disease. Electronically Signed   By: Ilona Sorrel M.D.   On: 04/12/2017 18:49    Assessment/Plan Present on Admission: . Seizures -Admit to med telemetry -Patient loaded with 1000 mg IV Keppra in the ER, 500 mg by mouth twice a day -EEG ordered -Seizure precaution -Ativan when necessary seizures -Neurology consult in a.m.  Brain metastasis ([place]) -Aware. Likely cause of above   Chronic leukocytosis -Stable at baseline. Due to cancer, doubt infection  Elevated troponin -Likely secondary to the seizures. Will order CKmb and cycle troponin. -No report of chest pains. EKG normal sinus rhythm  . Cervical  myopathy -aware  . Chronic kidney disease, stage III (moderate) -Stable, home medications resumed  . Essential hypertension -Stable, home medications resumed  . Hyperlipidemia -Stable, home medications resumed  . Small cell lung cancer ([place]) -aware  Oddie Bottger 04/12/2017, 7:03 PM

## 2017-04-12 NOTE — ED Notes (Signed)
0.08 tni reported to [employer] md

## 2017-04-13 DIAGNOSIS — N183 Chronic kidney disease, stage 3 (moderate): Secondary | ICD-10-CM | POA: Diagnosis not present

## 2017-04-13 DIAGNOSIS — R569 Unspecified convulsions: Secondary | ICD-10-CM | POA: Diagnosis not present

## 2017-04-13 DIAGNOSIS — E1122 Type 2 diabetes mellitus with diabetic chronic kidney disease: Secondary | ICD-10-CM | POA: Diagnosis not present

## 2017-04-13 DIAGNOSIS — C7931 Secondary malignant neoplasm of brain: Secondary | ICD-10-CM | POA: Diagnosis not present

## 2017-04-13 DIAGNOSIS — I129 Hypertensive chronic kidney disease with stage 1 through stage 4 chronic kidney disease, or unspecified chronic kidney disease: Secondary | ICD-10-CM | POA: Diagnosis not present

## 2017-04-13 DIAGNOSIS — M810 Age-related osteoporosis without current pathological fracture: Secondary | ICD-10-CM | POA: Diagnosis not present

## 2017-04-13 DIAGNOSIS — G729 Myopathy, unspecified: Secondary | ICD-10-CM | POA: Diagnosis not present

## 2017-04-13 DIAGNOSIS — C3490 Malignant neoplasm of unspecified part of unspecified bronchus or lung: Secondary | ICD-10-CM | POA: Diagnosis not present

## 2017-04-13 DIAGNOSIS — G40909 Epilepsy, unspecified, not intractable, without status epilepticus: Secondary | ICD-10-CM | POA: Diagnosis not present

## 2017-04-13 DIAGNOSIS — G629 Polyneuropathy, unspecified: Secondary | ICD-10-CM | POA: Diagnosis not present

## 2017-04-13 DIAGNOSIS — Z87891 Personal history of nicotine dependence: Secondary | ICD-10-CM | POA: Diagnosis not present

## 2017-04-13 DIAGNOSIS — I10 Essential (primary) hypertension: Secondary | ICD-10-CM | POA: Diagnosis not present

## 2017-04-13 LAB — TROPONIN I: Troponin I: 0.19 ng/mL (ref <0.03)

## 2017-04-13 MED ORDER — LEVETIRACETAM 500 MG PO TABS: 500.0000 mg | ORAL_TABLET | Freq: Two times a day (BID) | ORAL | 2 refills | Status: DC

## 2017-04-13 MED ORDER — HEPARIN SOD (PORK) LOCK FLUSH 100 UNIT/ML IV SOLN
500.0000 [IU] | Freq: Once | INTRAVENOUS | Status: AC
  Administered 2017-04-13: 500 [IU] via INTRAVENOUS
  Filled 2017-04-13: qty 5

## 2017-04-13 MED ORDER — COLCHICINE-PROBENECID 0.5-500 MG PO TABS
1.0000 | ORAL_TABLET | Freq: Two times a day (BID) | ORAL | Status: DC
  Administered 2017-04-13: 1 via ORAL
  Filled 2017-04-13 (×2): qty 1

## 2017-04-13 NOTE — Discharge Summary (Signed)
[facility] NAME: Tammie Buchanan    MR#:  [id number]  DATE OF BIRTH:  Jun 18, 1941  DATE OF ADMISSION:  04/12/2017 ADMITTING PHYSICIAN: Quintella Baton, MD  DATE OF DISCHARGE: 04/13/2017  2:04 PM  PRIMARY CARE PHYSICIAN: [name], NP    ADMISSION DIAGNOSIS:  Seizures ([place]) [R56.9]  DISCHARGE DIAGNOSIS:  Active Problems:   Small cell lung cancer (HCC)   Brain metastasis (HCC)   Cervical myopathy   Chronic kidney disease, stage III (moderate)   Diabetes mellitus, type II ([place])   Essential hypertension   Hyperlipidemia   Seizure ([place])   SECONDARY DIAGNOSIS:   Past Medical History:  Diagnosis Date  . Brain cancer ([place])   . Diabetes mellitus without complication ([place])   . Hypertension   . Lung cancer [facility])     HOSPITAL COURSE:   76 year old female with past medical history of lung cancer with metastatic disease, diabetes, hypertension, history of gout, osteoporosis, hyperlipidemia who presented to the hospital due to seizures.  1. Seizures-patient presented to the hospital with recurrent seizures. She does have a previous history of mild seizures which was being managed with some Decadron as patient has brain metastases. Yesterday on admission patient had a prolonged tonic-clonic seizure and therefore patient was brought to the hospital for further evaluation. Patient was loaded with IV Keppra. Neurology consult was obtained and he recommended continuing the patient on some Keppra along with her Decadron. -Patient has had no further seizures while in the hospital and therefore being discharged home on oral antiepileptics.  2. History of lung cancer with metastatic disease-continue follow-up with oncology as an outpatient. Patient has finished radiation treatment and is currently undergoing chemotherapy.  3. Hyperlipidemia-continue simvastatin.  4. History of gout-no acute attack. Continue colchicine probenecid.  5.  Essential hypertension-continue carvedilol, clonidine patch.  6. Osteoporosis-continue Fosamax.  DISCHARGE CONDITIONS:   Stable  CONSULTS OBTAINED:  Treatment Team:  Alexis Goodell, MD  DRUG ALLERGIES:   Allergies  Allergen Reactions  . Ace Inhibitors Anaphylaxis  . Allopurinol Anaphylaxis  . Angiotensin Receptor Blockers Anaphylaxis  . Codeine Anaphylaxis  . Enalapril Swelling    Other reaction(s): SWELLING/EDEMA  . Furosemide Swelling    Other reaction(s): OTHER  . Hydralazine Swelling    Other reaction(s): SWELLING/EDEMA  . Hydrochlorothiazide Anaphylaxis  . Irbesartan Swelling    Other reaction(s): SWELLING/EDEMA  . Levofloxacin Swelling    Other reaction(s): SWELLING/EDEMA  . Lidocaine Swelling    Other reaction(s): SWELLING/EDEMA  . Naproxen Sodium Anaphylaxis  . Spironolactone Anaphylaxis  . Tramadol Anaphylaxis    Other reaction(s): NAUSEA    DISCHARGE MEDICATIONS:   Allergies as of 04/13/2017      Reactions   Ace Inhibitors Anaphylaxis   Allopurinol Anaphylaxis   Angiotensin Receptor Blockers Anaphylaxis   Codeine Anaphylaxis   Enalapril Swelling   Other reaction(s): SWELLING/EDEMA   Furosemide Swelling   Other reaction(s): OTHER   Hydralazine Swelling   Other reaction(s): SWELLING/EDEMA   Hydrochlorothiazide Anaphylaxis   Irbesartan Swelling   Other reaction(s): SWELLING/EDEMA   Levofloxacin Swelling   Other reaction(s): SWELLING/EDEMA   Lidocaine Swelling   Other reaction(s): SWELLING/EDEMA   Naproxen Sodium Anaphylaxis   Spironolactone Anaphylaxis   Tramadol Anaphylaxis   Other reaction(s): NAUSEA      Medication List    TAKE these medications   acetaminophen 500 MG tablet Commonly known as:  TYLENOL Take 500 mg by mouth 2 (two) times daily as needed for moderate pain.  alendronate 70 MG tablet Commonly known as:  FOSAMAX Take 70 mg by mouth every Friday. Notes to patient:  TAKE ON FRIDAY   amLODipine 10 MG tablet Commonly  known as:  NORVASC Take 10 mg by mouth at bedtime.   ARTIFICIAL TEARS OP Apply 1 drop to eye daily as needed (dry eyes).   aspirin EC 81 MG tablet Take 81 mg by mouth daily.   Calcium Carb-Cholecalciferol 600-800 MG-UNIT Tabs Take 1 tablet by mouth daily. Notes to patient:  NONE GIVEN TODAY   carvedilol 25 MG tablet Commonly known as:  COREG Take 50 mg by mouth 2 (two) times daily with a meal.   cetirizine 10 MG tablet Commonly known as:  ZYRTEC Take 10 mg by mouth at bedtime.   cloNIDine 0.3 mg/24hr patch Commonly known as:  CATAPRES - Dosed in mg/24 hr Place 0.3 mg onto the skin once a week. Change patch on Wednesdays Notes to patient:  ON WEDNESDAY   colchicine-probenecid 0.5-500 MG tablet Take 1 tablet by mouth 2 (two) times daily.   dexamethasone 4 MG tablet Commonly known as:  DECADRON Take 1 tablet (4 mg total) by mouth daily.   docusate sodium 100 MG capsule Commonly known as:  COLACE Take 200-400 mg by mouth at bedtime.   erythromycin ophthalmic ointment Apply to eyelid margins every night   esomeprazole 40 MG capsule Commonly known as:  NEXIUM Take 40 mg by mouth daily.   gabapentin 300 MG capsule Commonly known as:  NEURONTIN Take 300 mg by mouth 3 (three) times daily.   levETIRAcetam 500 MG tablet Commonly known as:  KEPPRA Take 1 tablet (500 mg total) by mouth 2 (two) times daily.   Melatonin 10 MG Tabs Take 20 mg by mouth at bedtime.   ondansetron 8 MG tablet Commonly known as:  ZOFRAN Take 1 tablet (8 mg total) by mouth 2 (two) times daily as needed for refractory nausea / vomiting.   prochlorperazine 10 MG tablet Commonly known as:  COMPAZINE Take 1 tablet (10 mg total) by mouth every 6 (six) hours as needed (Nausea or vomiting).   simvastatin 20 MG tablet Commonly known as:  ZOCOR Take 20 mg by mouth every evening.         DISCHARGE INSTRUCTIONS:   DIET:  Cardiac diet  DISCHARGE CONDITION:  Stable  ACTIVITY:  Activity  as tolerated  OXYGEN:  Home Oxygen: No.   Oxygen Delivery: room air  DISCHARGE LOCATION:  home   If you experience worsening of your admission symptoms, develop shortness of breath, life threatening emergency, suicidal or homicidal thoughts you must seek medical attention immediately by calling 911 or calling your MD immediately  if symptoms less severe.  You Must read complete instructions/literature along with all the possible adverse reactions/side effects for all the Medicines you take and that have been prescribed to you. Take any new Medicines after you have completely understood and accpet all the possible adverse reactions/side effects.   Please note  You were cared for by a hospitalist during your hospital stay. If you have any questions about your discharge medications or the care you received while you were in the hospital after you are discharged, you can call the unit and asked to speak with the hospitalist on call if the hospitalist that took care of you is not available. Once you are discharged, your primary care physician will handle any further medical issues. Please note that NO REFILLS for any discharge medications will be authorized  once you are discharged, as it is imperative that you return to your primary care physician (or establish a relationship with a primary care physician if you do not have one) for your aftercare needs so that they can reassess your need for medications and monitor your lab values.     Today    no acute seizures overnight. Daughter at bedside. No complaints presently.  VITAL SIGNS:  Blood pressure (!) 115/41, pulse 73, temperature 98.2 F (36.8 C), temperature source Oral, resp. rate 20, height 5\' 5"  (1.651 m), weight 57.8 kg (127 lb 6.4 oz), SpO2 92 %.  I/O:   Intake/Output Summary (Last 24 hours) at 04/13/17 1544 Last data filed at 04/13/17 1300  Gross per 24 hour  Intake              590 ml  Output              900 ml  Net              -310 ml    PHYSICAL EXAMINATION:  GENERAL:  76 y.o.-year-old patient lying in the bed with no acute distress.  EYES: Pupils equal, round, reactive to light and accommodation. No scleral icterus. Extraocular muscles intact.  HEENT: Head atraumatic, normocephalic. Oropharynx and nasopharynx clear.  NECK:  Supple, no jugular venous distention. No thyroid enlargement, no tenderness.  LUNGS: Normal breath sounds bilaterally, no wheezing, rales,rhonchi. No use of accessory muscles of respiration.  CARDIOVASCULAR: S1, S2 normal. No murmurs, rubs, or gallops. Right sided Chest Port in place. ABDOMEN: Soft, non-tender, non-distended. Bowel sounds present. No organomegaly or mass.  EXTREMITIES: No pedal edema, cyanosis, or clubbing.  NEUROLOGIC: Cranial nerves II through XII are intact. No focal motor or sensory defecits b/l.  PSYCHIATRIC: The patient is alert and oriented x 3. Good affect.  SKIN: No obvious rash, lesion, or ulcer.   DATA REVIEW:   CBC  Recent Labs Lab 04/12/17 1523  WBC 22.6*  HGB 9.5*  HCT 28.2*  PLT 85*    Chemistries   Recent Labs Lab 04/12/17 1523  NA 135  K 3.9  CL 99*  CO2 27  GLUCOSE 139*  BUN 16  CREATININE 0.84  CALCIUM 6.6*  AST 27  ALT 21  ALKPHOS 110  BILITOT 0.5    Cardiac Enzymes  Recent Labs Lab 04/13/17 0225  TROPONINI 0.19*    Microbiology Results  No results found for this or any previous visit.  RADIOLOGY:  Ct Head Wo Contrast  Result Date: 04/12/2017 CLINICAL DATA:  Syncope he and left arm shaking. History of lung cancer and brain metastasis. EXAM: CT HEAD WITHOUT CONTRAST TECHNIQUE: Contiguous axial images were obtained from the base of the skull through the vertex without intravenous contrast. COMPARISON:  MRI brain 02/26/2017 FINDINGS: Brain: Stable cerebral atrophy, ventriculomegaly and periventricular white matter disease. Stable treated metastatic lesion at the right vertex. No new lesions are identified. No findings  for acute hemispheric infarction or intracranial hemorrhage. No extra-axial fluid collections. The brainstem and cerebellum are grossly normal in stable. Vascular: Stable vascular calcifications. No aneurysm or hyperdense vessels. Skull: No worrisome bone lesions or acute fracture. Sinuses/Orbits: The paranasal sinuses and mastoid air cells are clear. The globes are intact. Other: No scalp lesion or hematoma. IMPRESSION: 1. Stable area of treated metastatic disease in the right vertex area. 2. No new brain lesions are identified. No acute intracranial findings. 3. Stable cerebral atrophy, ventriculomegaly and periventricular white matter disease. Electronically Signed   By:  Marijo Sanes M.D.   On: 04/12/2017 16:16   Dg Chest Port 1 View  Result Date: 04/12/2017 CLINICAL DATA:  Dyspnea EXAM: PORTABLE CHEST 1 VIEW COMPARISON:  09/29/2009 chest radiograph. FINDINGS: Right internal jugular MediPort terminates in the lower third of the superior vena cava. Stable cardiomediastinal silhouette with top-normal heart size and aortic atherosclerosis. No pneumothorax. No pleural effusion. Lungs appear clear, with no acute consolidative airspace disease and no pulmonary edema. IMPRESSION: No active disease. Electronically Signed   By: Ilona Sorrel M.D.   On: 04/12/2017 18:49      Management plans discussed with the patient, family and they are in agreement.  CODE STATUS:  Code Status History    This patient does not have a recorded code status. Please follow your organizational policy for patients in this situation.      TOTAL TIME TAKING CARE OF THIS PATIENT: 40 minutes.    Henreitta Leber M.D on 04/13/2017 at 3:44 PM  Between 7am to 6pm - Pager - [phone]  After 6pm go to [email or web address] - Proofreader  [facility]  Office  [phone]  CC: Primary care physician; [name], NP

## 2017-04-13 NOTE — Consult Note (Signed)
Reason for Consult:Seizure Referring Physician: Sainani  CC: Seizure  HPI: Tammie Buchanan is an 76 y.o. female with a history of lung cancer with brain metastases, diagnosed May 1. She's had 10 rounds of brain radiation ending March 26. Since her radiation she has developed left focal seizures involving the left face, arm and leg.  Has been managed with decadron previously.  Yesterday she had a seizure that was generalized tonic-clonic, lasting approximately 5 minutes. This is her fourth seizure.   Past Medical History:  Diagnosis Date  . Brain cancer ([place])   . Diabetes mellitus without complication ([place])   . Hypertension   . Lung cancer [facility])     Past Surgical History:  Procedure Laterality Date  . ABDOMINAL HYSTERECTOMY     partial  . APPENDECTOMY    . PORTA CATH INSERTION N/A 02/04/2017   Procedure: [name];  Surgeon: Algernon Huxley, MD;  Location: [place] [facility];  Service: Cardiovascular;  Laterality: N/A;    Family History  Problem Relation Age of Onset  . Diabetes Mother   . Melanoma Sister   . Ovarian cancer Sister   . Diabetes Sister   . Diabetes Brother   . Diabetes Maternal Grandmother   . Diabetes Paternal Grandmother     Social History:  reports that she has quit smoking. She has never used smokeless tobacco. She reports that she does not drink alcohol or use drugs.  Allergies  Allergen Reactions  . Ace Inhibitors Anaphylaxis  . Allopurinol Anaphylaxis  . Angiotensin Receptor Blockers Anaphylaxis  . Codeine Anaphylaxis  . Enalapril Swelling    Other reaction(s): SWELLING/EDEMA  . Furosemide Swelling    Other reaction(s): OTHER  . Hydralazine Swelling    Other reaction(s): SWELLING/EDEMA  . Hydrochlorothiazide Anaphylaxis  . Irbesartan Swelling    Other reaction(s): SWELLING/EDEMA  . Levofloxacin Swelling    Other reaction(s): SWELLING/EDEMA  . Lidocaine Swelling    Other reaction(s): SWELLING/EDEMA  . Naproxen Sodium Anaphylaxis   . Spironolactone Anaphylaxis  . Tramadol Anaphylaxis    Other reaction(s): NAUSEA    Medications:  I have reviewed the patient's current medications. Prior to Admission:  Prescriptions Prior to Admission  Medication Sig Dispense Refill Last Dose  . acetaminophen (TYLENOL) 500 MG tablet Take 500 mg by mouth 2 (two) times daily as needed for moderate pain.   prn at prn  . alendronate (FOSAMAX) 70 MG tablet Take 70 mg by mouth every Friday.    04/03/2017  . amLODipine (NORVASC) 10 MG tablet Take 10 mg by mouth at bedtime.    04/11/2017 at qhs  . aspirin EC 81 MG tablet Take 81 mg by mouth daily.    04/12/2017 at am  . Calcium Carb-Cholecalciferol 600-800 MG-UNIT TABS Take 1 tablet by mouth daily.    Past Month at Unknown time  . carvedilol (COREG) 25 MG tablet Take 50 mg by mouth 2 (two) times daily with a meal.    04/12/2017 at am  . cetirizine (ZYRTEC) 10 MG tablet Take 10 mg by mouth at bedtime.    04/11/2017 at qhs  . colchicine-probenecid 0.5-500 MG tablet Take 1 tablet by mouth 2 (two) times daily.    04/12/2017 at am  . dexamethasone (DECADRON) 4 MG tablet Take 1 tablet (4 mg total) by mouth daily. 30 tablet 1 04/12/2017 at am  . docusate sodium (COLACE) 100 MG capsule Take 200-400 mg by mouth at bedtime.    04/11/2017 at qhs   . erythromycin  ophthalmic ointment Apply to eyelid margins every night   prn at prn  . esomeprazole (NEXIUM) 40 MG capsule Take 40 mg by mouth daily.    04/12/2017 at am  . gabapentin (NEURONTIN) 300 MG capsule Take 300 mg by mouth 3 (three) times daily.    04/12/2017 at am  . Hypromellose (ARTIFICIAL TEARS OP) Apply 1 drop to eye daily as needed (dry eyes).   prn at prn  . Melatonin 10 MG TABS Take 20 mg by mouth at bedtime.   04/11/2017 at qhs   . ondansetron (ZOFRAN) 8 MG tablet Take 1 tablet (8 mg total) by mouth 2 (two) times daily as needed for refractory nausea / vomiting. 60 tablet 2 prn at prn  . prochlorperazine (COMPAZINE) 10 MG tablet Take 1 tablet (10 mg  total) by mouth every 6 (six) hours as needed (Nausea or vomiting). 60 tablet 2 prn at prn  . simvastatin (ZOCOR) 20 MG tablet Take 20 mg by mouth every evening.    04/11/2017 at qhs   . cloNIDine (CATAPRES - DOSED IN MG/24 HR) 0.3 mg/24hr patch Place 0.3 mg onto the skin once a week. Change patch on Wednesdays   Not Taking at Unknown time   Scheduled: . amLODipine  10 mg Oral QHS  . aspirin EC  81 mg Oral Daily  . carvedilol  50 mg Oral BID WC  . [START ON 04/15/2017] cloNIDine  0.3 mg Transdermal Weekly  . colchicine-probenecid  1 tablet Oral BID  . dexamethasone  4 mg Oral Daily  . docusate sodium  200-400 mg Oral QHS  . gabapentin  300 mg Oral TID  . heparin lock flush  500 Units Intravenous Once  . levETIRAcetam  500 mg Oral BID  . loratadine  10 mg Oral Daily  . Melatonin  10 mg Oral QHS  . pantoprazole  40 mg Oral Daily    ROS: History obtained from daughter  General ROS: negative for - chills, fatigue, fever, night sweats, weight gain or weight loss Psychological ROS: negative for - behavioral disorder, hallucinations, memory difficulties, mood swings or suicidal ideation Ophthalmic ROS: negative for - blurry vision, double vision, eye pain or loss of vision ENT ROS: decreased hearing Allergy and Immunology ROS: negative for - hives or itchy/watery eyes Hematological and Lymphatic ROS: negative for - bleeding problems, bruising or swollen lymph nodes Endocrine ROS: negative for - galactorrhea, hair pattern changes, polydipsia/polyuria or temperature intolerance Respiratory ROS: negative for - cough, hemoptysis, shortness of breath or wheezing Cardiovascular ROS: negative for - chest pain, dyspnea on exertion, edema or irregular heartbeat Gastrointestinal ROS: negative for - abdominal pain, diarrhea, hematemesis, nausea/vomiting or stool incontinence Genito-Urinary ROS: negative for - dysuria, hematuria, incontinence or urinary frequency/urgency Musculoskeletal ROS: negative  for - joint swelling or muscular weakness Neurological ROS: as noted in HPI Dermatological ROS: negative for rash and skin lesion changes  Physical Examination: Blood pressure (!) 115/41, pulse 73, temperature 98.2 F (36.8 C), temperature source Oral, resp. rate 20, height 5\' 5"  (1.651 m), weight 57.8 kg (127 lb 6.4 oz), SpO2 92 %.  HEENT-  Normocephalic, no lesions, without obvious abnormality.  Normal external eye and conjunctiva.  Normal TM's bilaterally.  Normal auditory canals and external ears. Normal external nose, mucus membranes and septum.  Normal pharynx. Cardiovascular- S1, S2 normal, pulses palpable throughout   Lungs- chest clear, no wheezing, rales, normal symmetric air entry Abdomen- soft, non-tender; bowel sounds normal; no masses,  no organomegaly Extremities- no edema  Lymph-no adenopathy palpable Musculoskeletal-no joint tenderness, deformity or swelling Skin-warm and dry, no hyperpigmentation, vitiligo, or suspicious lesions  Neurological Examination   Mental Status: Alert, poor memory.  Speech fluent without evidence of aphasia.  Able to follow 3 step commands without difficulty. Cranial Nerves: II: Discs flat bilaterally; Visual fields grossly normal, pupils equal, round, reactive to light and accommodation III,IV, VI: ptosis not present, extra-ocular motions intact bilaterally V,VII: smile symmetric, facial light touch sensation normal bilaterally VIII: hearing normal bilaterally IX,X: gag reflex present XI: bilateral shoulder shrug XII: midline tongue extension Motor: Right : Upper extremity   5/5    Left:     Upper extremity   4/5  Lower extremity   5/5     Lower extremity   5-/5 Tone and bulk:normal tone throughout; no atrophy noted Sensory: Pinprick and light touch intact throughout, bilaterally Deep Tendon Reflexes: 2+ in the upper extremities and absent in the lower extremities Plantars: Right: mute   Left: mute Cerebellar: Normal finger-to-nose and  normal heel-to-shin testing bilaterally Gait: not tested due to safety concerns    Laboratory Studies:   Basic Metabolic Panel:  Recent Labs Lab 04/12/17 1523  NA 135  K 3.9  CL 99*  CO2 27  GLUCOSE 139*  BUN 16  CREATININE 0.84  CALCIUM 6.6*    Liver Function Tests:  Recent Labs Lab 04/12/17 1523  AST 27  ALT 21  ALKPHOS 110  BILITOT 0.5  PROT 5.6*  ALBUMIN 2.9*   No results for input(s): LIPASE, AMYLASE in the last 168 hours. No results for input(s): AMMONIA in the last 168 hours.  CBC:  Recent Labs Lab 04/12/17 1523  WBC 22.6*  HGB 9.5*  HCT 28.2*  MCV 93.7  PLT 85*    Cardiac Enzymes:  Recent Labs Lab 04/12/17 1523 04/12/17 2210 04/13/17 0225  CKTOTAL  --  25*  --   TROPONINI 0.08* 0.03* 0.19*    BNP: Invalid input(s): POCBNP  CBG: No results for input(s): GLUCAP in the last 168 hours.  Microbiology: No results found for this or any previous visit.  Coagulation Studies: No results for input(s): LABPROT, INR in the last 72 hours.  Urinalysis:  Recent Labs Lab 04/12/17 1538  COLORURINE STRAW*  LABSPEC 1.005  PHURINE 6.0  GLUCOSEU NEGATIVE  HGBUR NEGATIVE  BILIRUBINUR NEGATIVE  KETONESUR NEGATIVE  PROTEINUR NEGATIVE  NITRITE NEGATIVE  LEUKOCYTESUR NEGATIVE    Lipid Panel:  No results found for: CHOL, TRIG, HDL, CHOLHDL, VLDL, LDLCALC  HgbA1C: No results found for: HGBA1C  Urine Drug Screen:  No results found for: LABOPIA, COCAINSCRNUR, LABBENZ, AMPHETMU, THCU, LABBARB  Alcohol Level: No results for input(s): ETH in the last 168 hours.  Other results: EKG: sinus rhythm at 79 bpm  Imaging: Ct Head Wo Contrast  Result Date: 04/12/2017 CLINICAL DATA:  Syncope he and left arm shaking. History of lung cancer and brain metastasis. EXAM: CT HEAD WITHOUT CONTRAST TECHNIQUE: Contiguous axial images were obtained from the base of the skull through the vertex without intravenous contrast. COMPARISON:  MRI brain 02/26/2017  FINDINGS: Brain: Stable cerebral atrophy, ventriculomegaly and periventricular white matter disease. Stable treated metastatic lesion at the right vertex. No new lesions are identified. No findings for acute hemispheric infarction or intracranial hemorrhage. No extra-axial fluid collections. The brainstem and cerebellum are grossly normal in stable. Vascular: Stable vascular calcifications. No aneurysm or hyperdense vessels. Skull: No worrisome bone lesions or acute fracture. Sinuses/Orbits: The paranasal sinuses and mastoid air cells are clear.  The globes are intact. Other: No scalp lesion or hematoma. IMPRESSION: 1. Stable area of treated metastatic disease in the right vertex area. 2. No new brain lesions are identified. No acute intracranial findings. 3. Stable cerebral atrophy, ventriculomegaly and periventricular white matter disease. Electronically Signed   By: Marijo Sanes M.D.   On: 04/12/2017 16:16   Dg Chest Port 1 View  Result Date: 04/12/2017 CLINICAL DATA:  Dyspnea EXAM: PORTABLE CHEST 1 VIEW COMPARISON:  09/29/2009 chest radiograph. FINDINGS: Right internal jugular MediPort terminates in the lower third of the superior vena cava. Stable cardiomediastinal silhouette with top-normal heart size and aortic atherosclerosis. No pneumothorax. No pleural effusion. Lungs appear clear, with no acute consolidative airspace disease and no pulmonary edema. IMPRESSION: No active disease. Electronically Signed   By: Ilona Sorrel M.D.   On: 04/12/2017 18:49     Assessment/Plan: 76 year old female with a history of lung cancer and grain metastasis who presents with recurrent seizure.  Head CT reviewed and shows a right metastatis lesion with no evidence of progression of disease.  At this point it does not appear that Decadron alone will be able to control seizure activity.  Patient loaded with Keppra and now on maintenance of 500mg  BID  Appears to be tolerating well with no reports of side effects.     Recommendations: 1.  Continue Keppra at 500mg  BID 2.  Seizure precautions 3.  Patient unable to drive, operate heavy machinery, perform activities at heights and participate in water activities until release by outpatient physician.  Patient to continue follow up on an outpatient basis.     Alexis Goodell, MD Neurology [phone] 04/13/2017, 12:32 PM

## 2017-04-13 NOTE — Care Management Obs Status (Signed)
[facility] NOTIFICATION   Patient Details  Name: AREIL OTTEY MRN: [id number] Date of Birth: 15-Aug-1941   Medicare Observation Status Notification Given:  Yes    Marshell Garfinkel, RN 04/13/2017, 2:00 PM

## 2017-04-13 NOTE — Plan of Care (Signed)
Problem: Health Behavior/Discharge Planning: Goal: Ability to manage health-related needs will improve Outcome: Completed/Met Date Met: 04/13/17 Stable, can return home

## 2017-04-13 NOTE — Care Management CC44 (Signed)
Condition Code 44 Documentation Completed  Patient Details  Name: LINETTA REGNER MRN: [id number] Date of Birth: 1941/07/02   Condition Code 44 given:    Patient signature on Condition Code 44 notice:    Documentation of 2 MD's agreement:   yes Code 44 added to claim: yes      Beau Fanny, RN 04/13/2017, 1:57 PM

## 2017-04-13 NOTE — Plan of Care (Signed)
Problem: Pain Managment: Goal: General experience of comfort will improve Outcome: Progressing No complaints of pain this shift, will continue to monitor.

## 2017-04-13 NOTE — Progress Notes (Signed)
Discharged to home with daughter.  Port heparinized and de accessed.   Keppra prescription for seizure prevention.

## 2017-04-14 ENCOUNTER — Telehealth: Payer: Self-pay | Admitting: *Deleted

## 2017-04-14 DIAGNOSIS — C787 Secondary malignant neoplasm of liver and intrahepatic bile duct: Secondary | ICD-10-CM | POA: Diagnosis not present

## 2017-04-14 DIAGNOSIS — G8252 Quadriplegia, C1-C4 incomplete: Secondary | ICD-10-CM | POA: Diagnosis not present

## 2017-04-14 DIAGNOSIS — S14104S Unspecified injury at C4 level of cervical spinal cord, sequela: Secondary | ICD-10-CM | POA: Diagnosis not present

## 2017-04-14 DIAGNOSIS — I129 Hypertensive chronic kidney disease with stage 1 through stage 4 chronic kidney disease, or unspecified chronic kidney disease: Secondary | ICD-10-CM | POA: Diagnosis not present

## 2017-04-14 DIAGNOSIS — C3490 Malignant neoplasm of unspecified part of unspecified bronchus or lung: Secondary | ICD-10-CM | POA: Diagnosis not present

## 2017-04-14 DIAGNOSIS — C7931 Secondary malignant neoplasm of brain: Secondary | ICD-10-CM | POA: Diagnosis not present

## 2017-04-14 DIAGNOSIS — E1122 Type 2 diabetes mellitus with diabetic chronic kidney disease: Secondary | ICD-10-CM | POA: Diagnosis not present

## 2017-04-14 DIAGNOSIS — C7951 Secondary malignant neoplasm of bone: Secondary | ICD-10-CM | POA: Diagnosis not present

## 2017-04-14 DIAGNOSIS — N183 Chronic kidney disease, stage 3 (moderate): Secondary | ICD-10-CM | POA: Diagnosis not present

## 2017-04-14 NOTE — Telephone Encounter (Signed)
Daughter called to report that she has an ongoing respiratory infection and cough, she is scheduled for Chemo on Monday. Please advise

## 2017-04-14 NOTE — Telephone Encounter (Signed)
Informed per VO Dr Grayland Ormond that he still needs to see her on Monday. I asked if she is running fever and she stated no. Advised to call back for fever 110.5 or greater

## 2017-04-16 DIAGNOSIS — E1122 Type 2 diabetes mellitus with diabetic chronic kidney disease: Secondary | ICD-10-CM | POA: Diagnosis not present

## 2017-04-16 DIAGNOSIS — C787 Secondary malignant neoplasm of liver and intrahepatic bile duct: Secondary | ICD-10-CM | POA: Diagnosis not present

## 2017-04-16 DIAGNOSIS — I129 Hypertensive chronic kidney disease with stage 1 through stage 4 chronic kidney disease, or unspecified chronic kidney disease: Secondary | ICD-10-CM | POA: Diagnosis not present

## 2017-04-16 DIAGNOSIS — S14104S Unspecified injury at C4 level of cervical spinal cord, sequela: Secondary | ICD-10-CM | POA: Diagnosis not present

## 2017-04-16 DIAGNOSIS — C7951 Secondary malignant neoplasm of bone: Secondary | ICD-10-CM | POA: Diagnosis not present

## 2017-04-16 DIAGNOSIS — C3490 Malignant neoplasm of unspecified part of unspecified bronchus or lung: Secondary | ICD-10-CM | POA: Diagnosis not present

## 2017-04-16 DIAGNOSIS — N183 Chronic kidney disease, stage 3 (moderate): Secondary | ICD-10-CM | POA: Diagnosis not present

## 2017-04-16 DIAGNOSIS — C7931 Secondary malignant neoplasm of brain: Secondary | ICD-10-CM | POA: Diagnosis not present

## 2017-04-16 DIAGNOSIS — G8252 Quadriplegia, C1-C4 incomplete: Secondary | ICD-10-CM | POA: Diagnosis not present

## 2017-04-17 ENCOUNTER — Telehealth: Payer: Self-pay | Admitting: *Deleted

## 2017-04-17 ENCOUNTER — Other Ambulatory Visit: Payer: Self-pay | Admitting: Oncology

## 2017-04-17 MED ORDER — AMOXICILLIN-POT CLAVULANATE 875-125 MG PO TABS: 1.0000 | ORAL_TABLET | Freq: Two times a day (BID) | ORAL | 0 refills | Status: DC

## 2017-04-17 MED ORDER — LEVOFLOXACIN 500 MG PO TABS: 500.0000 mg | ORAL_TABLET | Freq: Every day | ORAL | 0 refills | Status: AC

## 2017-04-17 NOTE — Telephone Encounter (Signed)
Returned Daughter's call regarding patient's cough and congestion. Per Dr. Grayland Ormond 500 mg Levaquin daily for 5 days and can add OTC decongestant if needed. Daughter verbalized understanding.

## 2017-04-17 NOTE — Telephone Encounter (Signed)
Levaquin changed to Augmentin due to patient allergy.

## 2017-04-19 NOTE — Progress Notes (Signed)
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   INTERVAL HISTORY: Patient returns to clinic today for further evaluation and consideration of cycle 4 of 4 of carboplatinum and etoposide. She was recently admitted to the emergency for a tonic-clonic seizure. She was started on Keppra. She states she has had several "mini" seizures since she was discharged. Her family has not seen these. She also states that she feels "horrible". She is fatigued, weak and has URI like symptoms. She coughs all day and night and has been coughing up yellow and green sputum. She has also had fevers as high as 101.2. She has not taken tylenol or Ibuprofen because she said she was told not too but unsure of who told her not to take it. She has had a poor appetite. She has no chest pain, shortness of breath, or hemoptysis. She denies any nausea, vomiting, constipation, or diarrhea. She has no urinary complaints. Patient offers no further specific complaints today.  REVIEW OF SYSTEMS:   Review of Systems  Constitutional: Positive for chills, fever, malaise/fatigue and weight loss.  Respiratory: Positive for cough. Negative for hemoptysis, sputum production and shortness of breath.   Cardiovascular: Negative.  Negative for chest pain and leg swelling.  Gastrointestinal: Negative.  Negative for abdominal pain.  Genitourinary: Negative.   Musculoskeletal: Negative.   Skin: Negative.  Negative for rash.  Neurological: Positive for focal weakness, seizures and weakness. Negative for dizziness, sensory change and headaches.  Psychiatric/Behavioral: Negative.  The patient is not nervous/anxious.     As per HPI.  Otherwise, a complete review of systems is negative.  PAST MEDICAL HISTORY: Past Medical History:  Diagnosis Date  . Brain cancer ([place])   . Diabetes mellitus without complication ([place])   . Hypertension   . Lung cancer ([place])     PAST SURGICAL HISTORY: Past Surgical History:  Procedure Laterality Date  . ABDOMINAL HYSTERECTOMY     partial  . APPENDECTOMY    . PORTA CATH INSERTION N/A 02/04/2017   Procedure: [name];  Surgeon: Algernon Huxley, MD;  Location: [place] [facility];  Service: Cardiovascular;  Laterality: N/A;    FAMILY HISTORY: Family History  Problem Relation Age of Onset  . Diabetes Mother   . Melanoma Sister   . Ovarian cancer Sister   . Diabetes Sister   . Diabetes Brother   . Diabetes Maternal Grandmother   . Diabetes Paternal Grandmother     ADVANCED DIRECTIVES (Y/N):  N  HEALTH MAINTENANCE: Social History  Substance Use Topics  . Smoking status: Former Research scientist (life sciences)  . Smokeless tobacco: Never Used  . Alcohol use No     Colonoscopy:  PAP:  Bone density:  Lipid panel:  Allergies  Allergen Reactions  . Ace Inhibitors Anaphylaxis  . Allopurinol Anaphylaxis  . Angiotensin Receptor Blockers Anaphylaxis  . Codeine Anaphylaxis  . Enalapril Swelling    Other reaction(s): SWELLING/EDEMA  . Furosemide Swelling    Other reaction(s): OTHER  . Hydralazine Swelling    Other reaction(s): SWELLING/EDEMA  . Hydrochlorothiazide Anaphylaxis  . Irbesartan Swelling    Other reaction(s): SWELLING/EDEMA  . Levofloxacin Swelling    Other reaction(s): SWELLING/EDEMA  . Lidocaine Swelling    Other  reaction(s): SWELLING/EDEMA  . Naproxen Sodium Anaphylaxis  . Spironolactone Anaphylaxis  . Tramadol Anaphylaxis    Other reaction(s): NAUSEA    Current Outpatient Prescriptions  Medication Sig Dispense Refill  . acetaminophen (TYLENOL) 500 MG tablet Take 500 mg by mouth 2 (two) times daily as needed for moderate pain.    Marland Kitchen alendronate (FOSAMAX) 70 MG  tablet Take 70 mg by mouth every Friday.     Marland Kitchen amLODipine (NORVASC) 10 MG tablet Take 10 mg by mouth at bedtime.     Marland Kitchen amoxicillin-clavulanate (AUGMENTIN) 875-125 MG tablet Take 1 tablet by mouth 2 (two) times daily. For 7 days. 14 tablet 0  . aspirin EC 81 MG tablet Take 81 mg by mouth daily.     . Calcium Carb-Cholecalciferol 600-800 MG-UNIT TABS Take 1 tablet by mouth daily.     . carvedilol (COREG) 25 MG tablet Take 50 mg by mouth 2 (two) times daily with a meal.     . cetirizine (ZYRTEC) 10 MG tablet Take 10 mg by mouth at bedtime.     . cloNIDine (CATAPRES - DOSED IN MG/24 HR) 0.3 mg/24hr patch Place 0.3 mg onto the skin once a week. Change patch on Wednesdays    . colchicine-probenecid 0.5-500 MG tablet Take 1 tablet by mouth 2 (two) times daily.     Marland Kitchen dexamethasone (DECADRON) 4 MG tablet Take 1 tablet (4 mg total) by mouth daily. 30 tablet 1  . docusate sodium (COLACE) 100 MG capsule Take 200-400 mg by mouth at bedtime.     Marland Kitchen erythromycin ophthalmic ointment Apply to eyelid margins every night    . esomeprazole (NEXIUM) 40 MG capsule Take 40 mg by mouth daily.     Marland Kitchen gabapentin (NEURONTIN) 300 MG capsule Take 300 mg by mouth 3 (three) times daily.     . Hypromellose (ARTIFICIAL TEARS OP) Apply 1 drop to eye daily as needed (dry eyes).    Marland Kitchen levETIRAcetam (KEPPRA) 500 MG tablet Take 1 tablet (500 mg total) by mouth 2 (two) times daily. 60 tablet 2  . levofloxacin (LEVAQUIN) 500 MG tablet Take 1 tablet (500 mg total) by mouth daily. 5 tablet 0  . Melatonin 10 MG TABS Take 20 mg by mouth at bedtime.    . ondansetron (ZOFRAN) 8 MG tablet Take 1 tablet (8 mg total) by mouth 2 (two) times daily as needed for refractory nausea / vomiting. 60 tablet 2  . prochlorperazine (COMPAZINE) 10 MG tablet Take 1 tablet (10 mg total) by mouth every 6 (six) hours as needed (Nausea or vomiting). 60 tablet 2  . simvastatin (ZOCOR) 20 MG tablet Take 20 mg by mouth every evening.      No current  facility-administered medications for this visit.     OBJECTIVE: Vitals:   04/20/17 0920  BP: (!) 145/72  Pulse: 76  Resp: 20  Temp: (!) 96.1 F (35.6 C)     Body mass index is 22.08 kg/m.    ECOG FS:2 - Symptomatic, <50% confined to bed  General: Well-developed, well-nourished, no acute distress. Eyes: Pink conjunctiva, anicteric sclera. Lungs: Clear to auscultation bilaterally. Heart: Regular rate and rhythm. No rubs, murmurs, or gallops. Abdomen: Soft, nontender, nondistended. No organomegaly noted, normoactive bowel sounds. Musculoskeletal: No edema, cyanosis, or clubbing. Neuro: Alert, answering all questions appropriately. Cranial nerves grossly intact. Skin: No rashes or petechiae noted. Psych: Normal affect.   LAB RESULTS:  Lab Results  Component Value Date   NA 137 04/20/2017   K 3.5  04/20/2017   CL 100 (L) 04/20/2017   CO2 26 04/20/2017   GLUCOSE 161 (H) 04/20/2017   BUN 10 04/20/2017   CREATININE 0.86 04/20/2017   CALCIUM 8.1 (L) 04/20/2017   PROT 6.1 (L) 04/20/2017   ALBUMIN 2.7 (L) 04/20/2017   AST 20 04/20/2017   ALT 17 04/20/2017   ALKPHOS 115 04/20/2017   BILITOT 0.5 04/20/2017   GFRNONAA >60 04/20/2017   GFRAA >60 04/20/2017    Lab Results  Component Value Date   WBC 29.6 (H) 04/20/2017   NEUTROABS 24.5 (H) 04/20/2017   HGB 10.3 (L) 04/20/2017   HCT 29.9 (L) 04/20/2017   MCV 92.5 04/20/2017   PLT 641 (H) 04/20/2017     STUDIES: Ct Head Wo Contrast  Result Date: 04/12/2017 CLINICAL DATA:  Syncope he and left arm shaking. History of lung cancer and brain metastasis. EXAM: CT HEAD WITHOUT CONTRAST TECHNIQUE: Contiguous axial images were obtained from the base of the skull through the vertex without intravenous contrast. COMPARISON:  MRI brain 02/26/2017 FINDINGS: Brain: Stable cerebral atrophy, ventriculomegaly and periventricular white matter disease. Stable treated metastatic lesion at the right vertex. No new lesions are identified. No  findings for acute hemispheric infarction or intracranial hemorrhage. No extra-axial fluid collections. The brainstem and cerebellum are grossly normal in stable. Vascular: Stable vascular calcifications. No aneurysm or hyperdense vessels. Skull: No worrisome bone lesions or acute fracture. Sinuses/Orbits: The paranasal sinuses and mastoid air cells are clear. The globes are intact. Other: No scalp lesion or hematoma. IMPRESSION: 1. Stable area of treated metastatic disease in the right vertex area. 2. No new brain lesions are identified. No acute intracranial findings. 3. Stable cerebral atrophy, ventriculomegaly and periventricular white matter disease. Electronically Signed   By: Marijo Sanes M.D.   On: 04/12/2017 16:16   Dg Chest Port 1 View  Result Date: 04/12/2017 CLINICAL DATA:  Dyspnea EXAM: PORTABLE CHEST 1 VIEW COMPARISON:  09/29/2009 chest radiograph. FINDINGS: Right internal jugular MediPort terminates in the lower third of the superior vena cava. Stable cardiomediastinal silhouette with top-normal heart size and aortic atherosclerosis. No pneumothorax. No pleural effusion. Lungs appear clear, with no acute consolidative airspace disease and no pulmonary edema. IMPRESSION: No active disease. Electronically Signed   By: Ilona Sorrel M.D.   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   PLAN:    1. 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 No bone, hepatic, or other suspicious lesions were noted outside the brain. Primary lesion appears to be a 1.8 cm right upper lobe nodule. Patient was noted to have a 1.6 cm left hepatic lesion, but this was indeterminate and possibly a hemangioma. Liver  lesion was PET negative. Patient completed XRT her brain. Delay cycle 4 of carboplatinum and etoposide today due to URI symptoms and decreased performance status. Plan to reimage after 4 cycles. Return to clinic in 1 week for reconsideration of cycle 4.  2. Metastatic brain lesion: MRI the brain [facility] at [facility] on January 01, 2017 revealed a 4.5 x 3.4 x 4.3 lesion with rim enhancement in the right frontal lobe. Patient has completed XRT with significant improvement of her left sided weakness. Continue decadron as ordered. 3. Seizures: Recent seizure  activity. Tonic-clonic seizure on 04/12/17 with admission to the hospital. Continue Keppra as prescribed. Continue 4 mg Decadron daily. 4. Cough/congestion: Worsening. Patient has been instructed to complete her antibiotic course as prescribed. 5. Leukocytosis: Monitor.                                                                                                                                                    Patient expressed understanding and was in agreement with this plan. She also understands that She can call clinic at any time with any questions, concerns, or complaints.   Cancer Staging Small cell lung cancer [facility]) Staging form: Lung, AJCC 8th Edition - Clinical stage from 01/31/2017: Stage IVB (cT1b, cN2, cM1c) - Signed by Lloyd Huger, MD on 01/31/2017  Faythe Casa, NP  Patient was seen and evaluated independently and I agree with the assessment and plan as dictated above. CT of head reviewed independently and reported as above with no obvious recurrent or progressive disease.  Lloyd Huger, MD 04/21/17 7:00 AM

## 2017-04-20 ENCOUNTER — Inpatient Hospital Stay (HOSPITAL_BASED_OUTPATIENT_CLINIC_OR_DEPARTMENT_OTHER): Payer: Medicare HMO | Admitting: Oncology

## 2017-04-20 ENCOUNTER — Inpatient Hospital Stay: Payer: Medicare HMO

## 2017-04-20 VITALS — BP 145/72 | HR 76 | Temp 96.1°F | Resp 20 | Wt 132.7 lb

## 2017-04-20 DIAGNOSIS — Z5111 Encounter for antineoplastic chemotherapy: Secondary | ICD-10-CM | POA: Diagnosis not present

## 2017-04-20 DIAGNOSIS — R569 Unspecified convulsions: Secondary | ICD-10-CM

## 2017-04-20 DIAGNOSIS — C7801 Secondary malignant neoplasm of right lung: Secondary | ICD-10-CM

## 2017-04-20 DIAGNOSIS — I700 Atherosclerosis of aorta: Secondary | ICD-10-CM

## 2017-04-20 DIAGNOSIS — C3490 Malignant neoplasm of unspecified part of unspecified bronchus or lung: Secondary | ICD-10-CM

## 2017-04-20 DIAGNOSIS — Z87891 Personal history of nicotine dependence: Secondary | ICD-10-CM

## 2017-04-20 DIAGNOSIS — R609 Edema, unspecified: Secondary | ICD-10-CM

## 2017-04-20 DIAGNOSIS — R509 Fever, unspecified: Secondary | ICD-10-CM

## 2017-04-20 DIAGNOSIS — Z7982 Long term (current) use of aspirin: Secondary | ICD-10-CM

## 2017-04-20 DIAGNOSIS — Z923 Personal history of irradiation: Secondary | ICD-10-CM

## 2017-04-20 DIAGNOSIS — C7931 Secondary malignant neoplasm of brain: Secondary | ICD-10-CM | POA: Diagnosis not present

## 2017-04-20 DIAGNOSIS — D72829 Elevated white blood cell count, unspecified: Secondary | ICD-10-CM | POA: Diagnosis not present

## 2017-04-20 DIAGNOSIS — C7802 Secondary malignant neoplasm of left lung: Secondary | ICD-10-CM

## 2017-04-20 DIAGNOSIS — Z79899 Other long term (current) drug therapy: Secondary | ICD-10-CM

## 2017-04-20 DIAGNOSIS — C3411 Malignant neoplasm of upper lobe, right bronchus or lung: Secondary | ICD-10-CM | POA: Diagnosis not present

## 2017-04-20 DIAGNOSIS — Z8041 Family history of malignant neoplasm of ovary: Secondary | ICD-10-CM

## 2017-04-20 DIAGNOSIS — R05 Cough: Secondary | ICD-10-CM

## 2017-04-20 DIAGNOSIS — C3491 Malignant neoplasm of unspecified part of right bronchus or lung: Secondary | ICD-10-CM

## 2017-04-20 DIAGNOSIS — E119 Type 2 diabetes mellitus without complications: Secondary | ICD-10-CM

## 2017-04-20 DIAGNOSIS — I10 Essential (primary) hypertension: Secondary | ICD-10-CM | POA: Diagnosis not present

## 2017-04-20 DIAGNOSIS — R0981 Nasal congestion: Secondary | ICD-10-CM | POA: Diagnosis not present

## 2017-04-20 LAB — CBC WITH DIFFERENTIAL/PLATELET
Basophils Absolute: 0.3 K/uL — ABNORMAL HIGH (ref 0–0.1)
Basophils Relative: 1 %
Eosinophils Absolute: 0 K/uL (ref 0–0.7)
Eosinophils Relative: 0 %
HCT: 29.9 % — ABNORMAL LOW (ref 35.0–47.0)
Hemoglobin: 10.3 g/dL — ABNORMAL LOW (ref 12.0–16.0)
Lymphocytes Relative: 8 %
Lymphs Abs: 2.4 K/uL (ref 1.0–3.6)
MCH: 31.7 pg (ref 26.0–34.0)
MCHC: 34.2 g/dL (ref 32.0–36.0)
MCV: 92.5 fL (ref 80.0–100.0)
Monocytes Absolute: 2.4 K/uL — ABNORMAL HIGH (ref 0.2–0.9)
Monocytes Relative: 8 %
Neutro Abs: 24.5 K/uL — ABNORMAL HIGH (ref 1.4–6.5)
Neutrophils Relative %: 83 %
Platelets: 641 K/uL — ABNORMAL HIGH (ref 150–440)
RBC: 3.24 MIL/uL — ABNORMAL LOW (ref 3.80–5.20)
RDW: 20.2 % — ABNORMAL HIGH (ref 11.5–14.5)
Smear Review: INCREASED
WBC: 29.6 K/uL — ABNORMAL HIGH (ref 3.6–11.0)

## 2017-04-20 LAB — COMPREHENSIVE METABOLIC PANEL
ALBUMIN: 2.7 g/dL — AB (ref 3.5–5.0)
ALT: 17 U/L (ref 14–54)
AST: 20 U/L (ref 15–41)
Alkaline Phosphatase: 115 U/L (ref 38–126)
Anion gap: 11 (ref 5–15)
BUN: 10 mg/dL (ref 6–20)
CHLORIDE: 100 mmol/L — AB (ref 101–111)
CO2: 26 mmol/L (ref 22–32)
CREATININE: 0.86 mg/dL (ref 0.44–1.00)
Calcium: 8.1 mg/dL — ABNORMAL LOW (ref 8.9–10.3)
GFR calc Af Amer: >60 mL/min (ref >60)
GFR calc non Af Amer: >60 mL/min (ref >60)
GLUCOSE: 161 mg/dL — AB (ref 65–99)
POTASSIUM: 3.5 mmol/L (ref 3.5–5.1)
SODIUM: 137 mmol/L (ref 135–145)
Total Bilirubin: 0.5 mg/dL (ref 0.3–1.2)
Total Protein: 6.1 g/dL — ABNORMAL LOW (ref 6.5–8.1)

## 2017-04-20 MED ORDER — HEPARIN SOD (PORK) LOCK FLUSH 100 UNIT/ML IV SOLN
500.0000 [IU] | Freq: Once | INTRAVENOUS | Status: AC
  Administered 2017-04-20: 500 [IU] via INTRAVENOUS

## 2017-04-20 MED ORDER — SODIUM CHLORIDE 0.9% FLUSH
10.0000 mL | INTRAVENOUS | Status: DC | PRN
  Administered 2017-04-20: 10 mL via INTRAVENOUS
  Filled 2017-04-20: qty 10

## 2017-04-20 MED ORDER — DEXAMETHASONE 4 MG PO TABS
4.0000 mg | ORAL_TABLET | Freq: Every day | ORAL | 1 refills | Status: DC
Start: 2017-04-20 — End: 2017-07-01

## 2017-04-20 NOTE — Progress Notes (Signed)
Patient reports cold symptoms today, was seen in ER for seizures and started on Keppra.

## 2017-04-21 ENCOUNTER — Inpatient Hospital Stay: Payer: Medicare HMO

## 2017-04-21 DIAGNOSIS — E1122 Type 2 diabetes mellitus with diabetic chronic kidney disease: Secondary | ICD-10-CM | POA: Diagnosis not present

## 2017-04-21 DIAGNOSIS — C787 Secondary malignant neoplasm of liver and intrahepatic bile duct: Secondary | ICD-10-CM | POA: Diagnosis not present

## 2017-04-21 DIAGNOSIS — C7951 Secondary malignant neoplasm of bone: Secondary | ICD-10-CM | POA: Diagnosis not present

## 2017-04-21 DIAGNOSIS — G8252 Quadriplegia, C1-C4 incomplete: Secondary | ICD-10-CM | POA: Diagnosis not present

## 2017-04-21 DIAGNOSIS — C7931 Secondary malignant neoplasm of brain: Secondary | ICD-10-CM | POA: Diagnosis not present

## 2017-04-21 DIAGNOSIS — S14104S Unspecified injury at C4 level of cervical spinal cord, sequela: Secondary | ICD-10-CM | POA: Diagnosis not present

## 2017-04-21 DIAGNOSIS — C3490 Malignant neoplasm of unspecified part of unspecified bronchus or lung: Secondary | ICD-10-CM | POA: Diagnosis not present

## 2017-04-21 DIAGNOSIS — I129 Hypertensive chronic kidney disease with stage 1 through stage 4 chronic kidney disease, or unspecified chronic kidney disease: Secondary | ICD-10-CM | POA: Diagnosis not present

## 2017-04-21 DIAGNOSIS — N183 Chronic kidney disease, stage 3 (moderate): Secondary | ICD-10-CM | POA: Diagnosis not present

## 2017-04-22 ENCOUNTER — Inpatient Hospital Stay: Payer: Medicare HMO

## 2017-04-26 NOTE — Progress Notes (Signed)
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   INTERVAL HISTORY: Patient returns to clinic today for further evaluation and reconsideration of cycle 4 of 4 of carboplatinum and etoposide. She currently feels well and is nearly back to her baseline. She continues to have mild weakness and fatigue. She denies any fevers. She has no further neurologic complaints. She has a fair appetite, but denies weight loss. She has no chest pain, shortness of breath, or hemoptysis. She denies any nausea, vomiting, constipation, or diarrhea. She has no urinary complaints. Patient offers no further specific complaints today.  REVIEW OF SYSTEMS:   Review of Systems  Constitutional: Positive for malaise/fatigue. Negative for fever and weight loss.  Respiratory: Negative for cough, hemoptysis, sputum production and shortness of breath.   Cardiovascular: Negative.  Negative for chest pain and leg swelling.  Gastrointestinal: Negative.  Negative for abdominal pain.  Genitourinary: Negative.   Musculoskeletal: Negative.   Skin: Negative.  Negative for rash.  Neurological: Positive for focal weakness and weakness. Negative for dizziness, sensory change, seizures and headaches.  Psychiatric/Behavioral: Negative.  The patient is not nervous/anxious.     As per HPI. Otherwise, a complete review of systems is negative.  PAST MEDICAL HISTORY: Past Medical History:  Diagnosis Date  . Brain cancer ([place])   . Diabetes mellitus without complication ([place])   . Hypertension   . Lung cancer ([place])     PAST SURGICAL HISTORY: Past Surgical History:  Procedure Laterality Date  . ABDOMINAL HYSTERECTOMY     partial    . APPENDECTOMY    . PORTA CATH INSERTION N/A 02/04/2017   Procedure: Tammie Buchanan Cath Insertion;  Surgeon: Tammie Huxley, MD;  Location: [facility];  Service: Cardiovascular;  Laterality: N/A;    FAMILY HISTORY: Family History  Problem Relation Age of Onset  . Diabetes Mother   . Melanoma Sister   . Ovarian cancer Sister   . Diabetes Sister   . Diabetes Brother   . Diabetes Maternal Grandmother   . Diabetes Paternal Grandmother     ADVANCED DIRECTIVES (Y/N):  N  HEALTH MAINTENANCE: Social History  Substance Use Topics  . Smoking status: Former Research scientist (life sciences)  . Smokeless tobacco: Never Used  . Alcohol use No     Colonoscopy:  PAP:  Bone density:  Lipid panel:  Allergies  Allergen Reactions  . Ace Inhibitors Anaphylaxis  . Allopurinol Anaphylaxis  . Angiotensin Receptor Blockers Anaphylaxis  . Codeine Anaphylaxis  . Enalapril Swelling    Other reaction(s): SWELLING/EDEMA  . Furosemide Swelling    Other reaction(s): OTHER  . Hydralazine Swelling    Other reaction(s): SWELLING/EDEMA  . Hydrochlorothiazide Anaphylaxis  . Irbesartan Swelling    Other reaction(s): SWELLING/EDEMA  . Levofloxacin Swelling    Other reaction(s): SWELLING/EDEMA  . Lidocaine Swelling    Other reaction(s): SWELLING/EDEMA  . Naproxen Sodium Anaphylaxis  . Spironolactone Anaphylaxis  . Tramadol Anaphylaxis    Other reaction(s): NAUSEA    Current Outpatient Prescriptions  Medication Sig Dispense Refill  . acetaminophen (TYLENOL) 500 MG tablet Take 500 mg by mouth 2 (two) times daily as needed for moderate pain.    Marland Kitchen alendronate (FOSAMAX) 70 MG tablet Take 70 mg by mouth  every Friday.     Marland Kitchen amLODipine (NORVASC) 10 MG tablet Take 10 mg by mouth at bedtime.     Marland Kitchen amoxicillin-clavulanate (AUGMENTIN) 875-125 MG tablet Take 1 tablet by mouth 2 (two) times daily. For 7 days. 14 tablet 0  . aspirin EC 81 MG tablet Take 81 mg by mouth daily.     . Calcium Carb-Cholecalciferol 600-800 MG-UNIT TABS  Take 1 tablet by mouth daily.     . carvedilol (COREG) 25 MG tablet Take 50 mg by mouth 2 (two) times daily with a meal.     . cetirizine (ZYRTEC) 10 MG tablet Take 10 mg by mouth at bedtime.     . cloNIDine (CATAPRES - DOSED IN MG/24 HR) 0.3 mg/24hr patch Place 0.3 mg onto the skin once a week. Change patch on Wednesdays    . colchicine-probenecid 0.5-500 MG tablet Take 1 tablet by mouth 2 (two) times daily.     Marland Kitchen dexamethasone (DECADRON) 4 MG tablet Take 1 tablet (4 mg total) by mouth daily. 30 tablet 1  . docusate sodium (COLACE) 100 MG capsule Take 200-400 mg by mouth at bedtime.     Marland Kitchen erythromycin ophthalmic ointment Apply to eyelid margins every night    . esomeprazole (NEXIUM) 40 MG capsule Take 40 mg by mouth daily.     Marland Kitchen gabapentin (NEURONTIN) 300 MG capsule Take 300 mg by mouth 3 (three) times daily.     . Hypromellose (ARTIFICIAL TEARS OP) Apply 1 drop to eye daily as needed (dry eyes).    Marland Kitchen levETIRAcetam (KEPPRA) 500 MG tablet Take 1 tablet (500 mg total) by mouth 2 (two) times daily. 60 tablet 2  . Melatonin 10 MG TABS Take 20 mg by mouth at bedtime.    . ondansetron (ZOFRAN) 8 MG tablet Take 1 tablet (8 mg total) by mouth 2 (two) times daily as needed for refractory nausea / vomiting. 60 tablet 2  . prochlorperazine (COMPAZINE) 10 MG tablet Take 1 tablet (10 mg total) by mouth every 6 (six) hours as needed (Nausea or vomiting). 60 tablet 2  . simvastatin (ZOCOR) 20 MG tablet Take 20 mg by mouth every evening.      No current facility-administered medications for this visit.    Facility-Administered Medications Ordered in Other Visits  Medication Dose Route Frequency Provider Last Rate Last Dose  . sodium chloride flush (NS) 0.9 % injection 10 mL  10 mL Intravenous PRN Tammie Huger, MD   10 mL at 04/27/17 0859    OBJECTIVE: Vitals:   04/27/17 0942  BP: 116/65  Pulse: 75  Resp: 20  Temp: (!) 96.8 F (36 C)     Body mass index is 23.13 kg/m.    ECOG FS:1 -  Symptomatic but completely ambulatory  General: Well-developed, well-nourished, no acute distress. Eyes: Pink conjunctiva, anicteric sclera. Lungs: Clear to auscultation bilaterally. Heart: Regular rate and rhythm. No rubs, murmurs, or gallops. Abdomen: Soft, nontender, nondistended. No organomegaly noted, normoactive bowel sounds. Musculoskeletal: No edema, cyanosis, or clubbing. Neuro: Alert, answering all questions appropriately. Cranial nerves grossly intact. Skin: No rashes or petechiae noted. Psych: Normal affect.   LAB RESULTS:  Lab Results  Component Value Date   NA 135 04/27/2017   K 4.1 04/27/2017   CL 100 (L) 04/27/2017   CO2 27 04/27/2017   GLUCOSE 293 (H) 04/27/2017   BUN 12 04/27/2017   CREATININE 0.85 04/27/2017   CALCIUM 7.5 (L) 04/27/2017   PROT 5.6 (L) 04/27/2017   ALBUMIN  2.5 (L) 04/27/2017   AST 19 04/27/2017   ALT 19 04/27/2017   ALKPHOS 91 04/27/2017   BILITOT 0.4 04/27/2017   GFRNONAA >60 04/27/2017   GFRAA >60 04/27/2017    Lab Results  Component Value Date   WBC 28.4 (H) 04/27/2017   NEUTROABS 25.8 (H) 04/27/2017   HGB 9.8 (L) 04/27/2017   HCT 28.6 (L) 04/27/2017   MCV 92.7 04/27/2017   PLT 393 04/27/2017     STUDIES: Ct Head Wo Contrast  Result Date: 04/12/2017 CLINICAL DATA:  Syncope he and left arm shaking. History of lung cancer and brain metastasis. EXAM: CT HEAD WITHOUT CONTRAST TECHNIQUE: Contiguous axial images were obtained from the base of the skull through the vertex without intravenous contrast. COMPARISON:  MRI brain 02/26/2017 FINDINGS: Brain: Stable cerebral atrophy, ventriculomegaly and periventricular white matter disease. Stable treated metastatic lesion at the right vertex. No new lesions are identified. No findings for acute hemispheric infarction or intracranial hemorrhage. No extra-axial fluid collections. The brainstem and cerebellum are grossly normal in stable. Vascular: Stable vascular calcifications. No aneurysm or  hyperdense vessels. Skull: No worrisome bone lesions or acute fracture. Sinuses/Orbits: The paranasal sinuses and mastoid air cells are clear. The globes are intact. Other: No scalp lesion or hematoma. IMPRESSION: 1. Stable area of treated metastatic disease in the right vertex area. 2. No new brain lesions are identified. No acute intracranial findings. 3. Stable cerebral atrophy, ventriculomegaly and periventricular white matter disease. Electronically Signed   By: Marijo Sanes M.D.   On: 04/12/2017 16:16   Dg Chest Port 1 View  Result Date: 04/12/2017 CLINICAL DATA:  Dyspnea EXAM: PORTABLE CHEST 1 VIEW COMPARISON:  09/29/2009 chest radiograph. FINDINGS: Right internal jugular MediPort terminates in the lower third of the superior vena cava. Stable cardiomediastinal silhouette with top-normal heart size and aortic atherosclerosis. No pneumothorax. No pleural effusion. Lungs appear clear, with no acute consolidative airspace disease and no pulmonary edema. IMPRESSION: No active disease. Electronically Signed   By: Ilona Sorrel M.D.   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   PLAN:    1. 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 No bone, hepatic, or other suspicious lesions were noted outside the brain. Primary lesion appears to be a 1.8 cm right upper lobe nodule. Patient was noted to have a 1.6 cm left hepatic lesion, but this was indeterminate and possibly a hemangioma. Liver lesion was PET negative. Patient completed XRT her brain. Proceed with cycle 4 of carboplatinum and etoposide today. She also receives etoposide on days 2 and 3. (No day 3 this cycle secondary to fourth of July holiday)  Patient will require Neulasta support with each treatment. Patient will return to clinic in 1 month with repeat PET scan and further evaluation. If patient has residual disease we will proceed with cycle 5.  2. Metastatic brain lesion: MRI the brain completed at [facility] on January 01, 2017 revealed a 4.5 x 3.4 x 4.3 lesion with rim enhancement in the right frontal lobe. Patient has completed XRT.  3. Seizures: She has no new obvious metastatic lesions, but patient continues to have vasogenic edema. Continue Decadron and Keppra as prescribed. 4. Lower extremity edema: Ultrasound Doppler negative  for DVT. Monitor. 5. Cough/congestion: Resolved. 6. Leukocytosis: Multifactorial, improved. Monitor.                                                                                                                                                    Patient expressed understanding and was in agreement with this plan. She also understands that She can call clinic at any time with any questions, concerns, or complaints.   Cancer Staging Small cell lung cancer [facility]) Staging form: Lung, AJCC 8th Edition - Clinical stage from 01/31/2017: Stage IVB (cT1b, cN2, cM1c) - Signed by Tammie Huger, MD on 01/31/2017   Tammie Huger, MD   04/27/2017 2:45 PM

## 2017-04-27 ENCOUNTER — Inpatient Hospital Stay: Payer: Medicare HMO | Attending: Oncology

## 2017-04-27 ENCOUNTER — Inpatient Hospital Stay (HOSPITAL_BASED_OUTPATIENT_CLINIC_OR_DEPARTMENT_OTHER): Payer: Medicare HMO | Admitting: Oncology

## 2017-04-27 ENCOUNTER — Inpatient Hospital Stay: Payer: Medicare HMO

## 2017-04-27 VITALS — BP 150/76 | HR 76 | Temp 96.9°F | Resp 18

## 2017-04-27 VITALS — BP 116/65 | HR 75 | Temp 96.8°F | Resp 20 | Wt 139.0 lb

## 2017-04-27 DIAGNOSIS — C7931 Secondary malignant neoplasm of brain: Secondary | ICD-10-CM

## 2017-04-27 DIAGNOSIS — I700 Atherosclerosis of aorta: Secondary | ICD-10-CM

## 2017-04-27 DIAGNOSIS — R569 Unspecified convulsions: Secondary | ICD-10-CM | POA: Diagnosis not present

## 2017-04-27 DIAGNOSIS — R609 Edema, unspecified: Secondary | ICD-10-CM

## 2017-04-27 DIAGNOSIS — C7802 Secondary malignant neoplasm of left lung: Secondary | ICD-10-CM | POA: Diagnosis not present

## 2017-04-27 DIAGNOSIS — R918 Other nonspecific abnormal finding of lung field: Secondary | ICD-10-CM | POA: Diagnosis not present

## 2017-04-27 DIAGNOSIS — C3411 Malignant neoplasm of upper lobe, right bronchus or lung: Secondary | ICD-10-CM | POA: Diagnosis not present

## 2017-04-27 DIAGNOSIS — R5383 Other fatigue: Secondary | ICD-10-CM | POA: Diagnosis not present

## 2017-04-27 DIAGNOSIS — I10 Essential (primary) hypertension: Secondary | ICD-10-CM | POA: Insufficient documentation

## 2017-04-27 DIAGNOSIS — R0981 Nasal congestion: Secondary | ICD-10-CM

## 2017-04-27 DIAGNOSIS — Z7982 Long term (current) use of aspirin: Secondary | ICD-10-CM | POA: Insufficient documentation

## 2017-04-27 DIAGNOSIS — Z5111 Encounter for antineoplastic chemotherapy: Secondary | ICD-10-CM | POA: Diagnosis not present

## 2017-04-27 DIAGNOSIS — R05 Cough: Secondary | ICD-10-CM

## 2017-04-27 DIAGNOSIS — C3491 Malignant neoplasm of unspecified part of right bronchus or lung: Secondary | ICD-10-CM

## 2017-04-27 DIAGNOSIS — R509 Fever, unspecified: Secondary | ICD-10-CM | POA: Diagnosis not present

## 2017-04-27 DIAGNOSIS — R531 Weakness: Secondary | ICD-10-CM | POA: Diagnosis not present

## 2017-04-27 DIAGNOSIS — D72829 Elevated white blood cell count, unspecified: Secondary | ICD-10-CM | POA: Insufficient documentation

## 2017-04-27 DIAGNOSIS — Z923 Personal history of irradiation: Secondary | ICD-10-CM | POA: Diagnosis not present

## 2017-04-27 DIAGNOSIS — C7801 Secondary malignant neoplasm of right lung: Secondary | ICD-10-CM | POA: Diagnosis not present

## 2017-04-27 DIAGNOSIS — Z87891 Personal history of nicotine dependence: Secondary | ICD-10-CM | POA: Diagnosis not present

## 2017-04-27 DIAGNOSIS — Z8041 Family history of malignant neoplasm of ovary: Secondary | ICD-10-CM

## 2017-04-27 DIAGNOSIS — E119 Type 2 diabetes mellitus without complications: Secondary | ICD-10-CM | POA: Diagnosis not present

## 2017-04-27 DIAGNOSIS — Z79899 Other long term (current) drug therapy: Secondary | ICD-10-CM

## 2017-04-27 DIAGNOSIS — C3490 Malignant neoplasm of unspecified part of unspecified bronchus or lung: Secondary | ICD-10-CM

## 2017-04-27 LAB — COMPREHENSIVE METABOLIC PANEL
ALT: 19 U/L (ref 14–54)
ANION GAP: 8 (ref 5–15)
AST: 19 U/L (ref 15–41)
Albumin: 2.5 g/dL — ABNORMAL LOW (ref 3.5–5.0)
Alkaline Phosphatase: 91 U/L (ref 38–126)
BUN: 12 mg/dL (ref 6–20)
CALCIUM: 7.5 mg/dL — AB (ref 8.9–10.3)
CHLORIDE: 100 mmol/L — AB (ref 101–111)
CO2: 27 mmol/L (ref 22–32)
Creatinine, Ser: 0.85 mg/dL (ref 0.44–1.00)
GFR calc non Af Amer: >60 mL/min (ref >60)
Glucose, Bld: 293 mg/dL — ABNORMAL HIGH (ref 65–99)
Potassium: 4.1 mmol/L (ref 3.5–5.1)
SODIUM: 135 mmol/L (ref 135–145)
Total Bilirubin: 0.4 mg/dL (ref 0.3–1.2)
Total Protein: 5.6 g/dL — ABNORMAL LOW (ref 6.5–8.1)

## 2017-04-27 LAB — CBC WITH DIFFERENTIAL/PLATELET
Basophils Absolute: 0.2 10*3/uL — ABNORMAL HIGH (ref 0–0.1)
Basophils Relative: 1 %
EOS ABS: 0 10*3/uL (ref 0–0.7)
EOS PCT: 0 %
HCT: 28.6 % — ABNORMAL LOW (ref 35.0–47.0)
Hemoglobin: 9.8 g/dL — ABNORMAL LOW (ref 12.0–16.0)
LYMPHS ABS: 1.1 10*3/uL (ref 1.0–3.6)
Lymphocytes Relative: 4 %
MCH: 31.7 pg (ref 26.0–34.0)
MCHC: 34.2 g/dL (ref 32.0–36.0)
MCV: 92.7 fL (ref 80.0–100.0)
Monocytes Absolute: 1.4 10*3/uL — ABNORMAL HIGH (ref 0.2–0.9)
Monocytes Relative: 5 %
Neutro Abs: 25.8 10*3/uL — ABNORMAL HIGH (ref 1.4–6.5)
Neutrophils Relative %: 90 %
Platelets: 393 10*3/uL (ref 150–440)
RBC: 3.08 MIL/uL — ABNORMAL LOW (ref 3.80–5.20)
RDW: 19.8 % — ABNORMAL HIGH (ref 11.5–14.5)
WBC: 28.4 10*3/uL — AB (ref 3.6–11.0)

## 2017-04-27 MED ORDER — HEPARIN SOD (PORK) LOCK FLUSH 100 UNIT/ML IV SOLN
500.0000 [IU] | Freq: Once | INTRAVENOUS | Status: AC
  Administered 2017-04-27: 500 [IU] via INTRAVENOUS

## 2017-04-27 MED ORDER — PALONOSETRON HCL INJECTION 0.25 MG/5ML
0.2500 mg | Freq: Once | INTRAVENOUS | Status: AC
  Administered 2017-04-27: 0.25 mg via INTRAVENOUS

## 2017-04-27 MED ORDER — SODIUM CHLORIDE 0.9% FLUSH
10.0000 mL | INTRAVENOUS | Status: DC | PRN
  Administered 2017-04-27: 10 mL via INTRAVENOUS
  Filled 2017-04-27: qty 10

## 2017-04-27 MED ORDER — HEPARIN SOD (PORK) LOCK FLUSH 100 UNIT/ML IV SOLN
500.0000 [IU] | Freq: Once | INTRAVENOUS | Status: AC | PRN
  Administered 2017-04-27: 500 [IU]

## 2017-04-27 MED ORDER — CARBOPLATIN CHEMO INJECTION 450 MG/45ML
350.0000 mg | Freq: Once | INTRAVENOUS | Status: AC
  Administered 2017-04-27: 350 mg via INTRAVENOUS
  Filled 2017-04-27: qty 35

## 2017-04-27 MED ORDER — DEXAMETHASONE SODIUM PHOSPHATE 10 MG/ML IJ SOLN
10.0000 mg | Freq: Once | INTRAMUSCULAR | Status: AC
  Administered 2017-04-27: 10 mg via INTRAVENOUS

## 2017-04-27 MED ORDER — SODIUM CHLORIDE 0.9 % IV SOLN
100.0000 mg/m2 | Freq: Once | INTRAVENOUS | Status: AC
  Administered 2017-04-27: 170 mg via INTRAVENOUS
  Filled 2017-04-27: qty 8.5

## 2017-04-27 MED ORDER — SODIUM CHLORIDE 0.9 % IV SOLN
Freq: Once | INTRAVENOUS | Status: AC
Start: 2017-04-27 — End: 2017-04-27
  Administered 2017-04-27: 10:00:00 via INTRAVENOUS
  Filled 2017-04-27: qty 1000

## 2017-04-27 NOTE — Progress Notes (Signed)
Patient denies any concerns today.  

## 2017-04-28 ENCOUNTER — Inpatient Hospital Stay: Payer: Medicare HMO

## 2017-04-28 VITALS — BP 166/72 | HR 74 | Resp 20

## 2017-04-28 DIAGNOSIS — R918 Other nonspecific abnormal finding of lung field: Secondary | ICD-10-CM | POA: Diagnosis not present

## 2017-04-28 DIAGNOSIS — R531 Weakness: Secondary | ICD-10-CM | POA: Diagnosis not present

## 2017-04-28 DIAGNOSIS — Z5111 Encounter for antineoplastic chemotherapy: Secondary | ICD-10-CM | POA: Diagnosis not present

## 2017-04-28 DIAGNOSIS — D72829 Elevated white blood cell count, unspecified: Secondary | ICD-10-CM | POA: Diagnosis not present

## 2017-04-28 DIAGNOSIS — C3491 Malignant neoplasm of unspecified part of right bronchus or lung: Secondary | ICD-10-CM | POA: Diagnosis not present

## 2017-04-28 DIAGNOSIS — R5383 Other fatigue: Secondary | ICD-10-CM | POA: Diagnosis not present

## 2017-04-28 DIAGNOSIS — R609 Edema, unspecified: Secondary | ICD-10-CM | POA: Diagnosis not present

## 2017-04-28 DIAGNOSIS — R569 Unspecified convulsions: Secondary | ICD-10-CM | POA: Diagnosis not present

## 2017-04-28 DIAGNOSIS — C7931 Secondary malignant neoplasm of brain: Secondary | ICD-10-CM | POA: Diagnosis not present

## 2017-04-28 MED ORDER — DEXAMETHASONE SODIUM PHOSPHATE 10 MG/ML IJ SOLN
10.0000 mg | Freq: Once | INTRAMUSCULAR | Status: AC
  Administered 2017-04-28: 10 mg via INTRAVENOUS

## 2017-04-28 MED ORDER — PEGFILGRASTIM 6 MG/0.6ML ~~LOC~~ PSKT
6.0000 mg | PREFILLED_SYRINGE | Freq: Once | SUBCUTANEOUS | Status: AC
  Administered 2017-04-28: 6 mg via SUBCUTANEOUS

## 2017-04-28 MED ORDER — SODIUM CHLORIDE 0.9 % IV SOLN
Freq: Once | INTRAVENOUS | Status: AC
  Administered 2017-04-28: 14:00:00 via INTRAVENOUS
  Filled 2017-04-28: qty 1000

## 2017-04-28 MED ORDER — SODIUM CHLORIDE 0.9 % IV SOLN
100.0000 mg/m2 | Freq: Once | INTRAVENOUS | Status: AC
  Administered 2017-04-28: 170 mg via INTRAVENOUS
  Filled 2017-04-28: qty 8.5

## 2017-05-08 ENCOUNTER — Other Ambulatory Visit: Payer: Self-pay | Admitting: *Deleted

## 2017-05-08 ENCOUNTER — Telehealth: Payer: Self-pay | Admitting: *Deleted

## 2017-05-08 MED ORDER — AMOXICILLIN-POT CLAVULANATE 875-125 MG PO TABS: 1.0000 | ORAL_TABLET | Freq: Two times a day (BID) | ORAL | 0 refills | Status: DC

## 2017-05-08 MED ORDER — LEVOFLOXACIN 500 MG PO TABS: 500.0000 mg | ORAL_TABLET | Freq: Every day | ORAL | 0 refills | Status: DC

## 2017-05-08 NOTE — Telephone Encounter (Signed)
Daughter called to report that she is getting sick again and she does not want to take her to walk in clinic. Asking if Dr Grayland Ormond will order abx for her

## 2017-05-08 NOTE — Telephone Encounter (Signed)
Informed of rx being sent to pharmacy

## 2017-05-08 NOTE — Telephone Encounter (Signed)
Pharmacy called and said pt is allergic to levaquin. Called finnegan and asked him and she got another atb in June and it was augmentin and she will need that one again. Sent it in to pharmacy and spoke with pharmacy and told them to cancel levaquin and give augmentin and I sent it in electronically

## 2017-05-08 NOTE — Telephone Encounter (Signed)
levaquin 500mg  daily x7.  Tammie Buchanan

## 2017-05-20 ENCOUNTER — Ambulatory Visit
Admission: RE | Admit: 2017-05-20 | Discharge: 2017-05-20 | Disposition: A | Discharge status: Home / Self Care | Payer: Medicare HMO | Source: Ambulatory Visit | Attending: Oncology | Admitting: Oncology

## 2017-05-20 DIAGNOSIS — K8020 Calculus of gallbladder without cholecystitis without obstruction: Secondary | ICD-10-CM | POA: Diagnosis not present

## 2017-05-20 DIAGNOSIS — C3490 Malignant neoplasm of unspecified part of unspecified bronchus or lung: Secondary | ICD-10-CM | POA: Diagnosis not present

## 2017-05-20 DIAGNOSIS — J439 Emphysema, unspecified: Secondary | ICD-10-CM | POA: Insufficient documentation

## 2017-05-20 DIAGNOSIS — C7931 Secondary malignant neoplasm of brain: Secondary | ICD-10-CM | POA: Diagnosis not present

## 2017-05-20 DIAGNOSIS — I700 Atherosclerosis of aorta: Secondary | ICD-10-CM | POA: Insufficient documentation

## 2017-05-20 LAB — GLUCOSE, CAPILLARY: Glucose-Capillary: 170 mg/dL — ABNORMAL HIGH (ref 65–99)

## 2017-05-20 MED ORDER — FLUDEOXYGLUCOSE F - 18 (FDG) INJECTION
12.5700 | Freq: Once | INTRAVENOUS | Status: AC | PRN
  Administered 2017-05-20: 12.57 via INTRAVENOUS

## 2017-05-24 DIAGNOSIS — Z5111 Encounter for antineoplastic chemotherapy: Secondary | ICD-10-CM | POA: Insufficient documentation

## 2017-05-24 NOTE — Progress Notes (Signed)
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   INTERVAL HISTORY: She was last seen in the medical oncology clinic by Dr. Delight Buchanan on 04/27/2017. At that time, she had some mild weakness and fatigue. She received cycle #4 carboplatin and etoposide. She only received 2 days of therapy as day 3 was on the 4th of July. She received Neulasta support.  She contacted the clinic on 05/08/2017 and noted that she was "sick again". She was called in a prescription for Augmentin.   PET scan on 05/20/2017 revealed no evidence to suggest hypermetabolic mass or adenopathy. There were few small pulmonary nodules identified in the lungs which were too small to characterize measuring up to 7 mm.  There was mild heterogeneous radiotracer activity identified within the axial appendicular skeleton.  Symptomatically, notes some low back pain and constipation. She is taking Decadron 4 mg a day. She has been on this dose for some time. Review of records indicate that cranial radiation completed on 01/20/2017.  REVIEW OF SYSTEMS:   Review of Systems  Constitutional: Positive for malaise/fatigue. Negative for fever and weight loss.       Weight gain of 2 pound  HENT: Negative for congestion, ear pain and sore throat.   Eyes: Negative.  Negative for blurred vision.  Respiratory: Negative.  Negative for cough, hemoptysis, sputum production and shortness of breath.   Cardiovascular: Negative.  Negative for chest pain and leg swelling.  Gastrointestinal: Positive for constipation. Negative for abdominal pain, blood in stool, heartburn, melena, nausea and vomiting.  Genitourinary: Negative.  Negative for  dysuria, flank pain, frequency and urgency.  Musculoskeletal: Positive for back pain.  Skin: Negative.  Negative for itching and rash.  Neurological: Positive for weakness. Negative for dizziness, sensory change, focal weakness, seizures and headaches.  Psychiatric/Behavioral: Negative.  The patient is not nervous/anxious.     As per HPI. Otherwise, a complete review of systems is negative.  PAST MEDICAL HISTORY: Past Medical History:  Diagnosis Date  . Brain cancer ([place])   . Diabetes mellitus without complication ([place])   . Hypertension   . Lung cancer ([place])     PAST SURGICAL HISTORY: Past Surgical History:  Procedure Laterality Date  . ABDOMINAL HYSTERECTOMY     partial  . APPENDECTOMY    . PORTA CATH INSERTION N/A 02/04/2017   Procedure: [name];  Surgeon: Algernon Huxley, MD;  Location: [facility];  Service: Cardiovascular;  Laterality: N/A;    FAMILY HISTORY: Family History  Problem Relation Age of Onset  . Diabetes Mother   . Melanoma Sister   . Ovarian cancer Sister   . Diabetes Sister   . Diabetes Brother   . Diabetes Maternal Grandmother   . Diabetes Paternal Grandmother     ADVANCED DIRECTIVES (Y/N):  N  HEALTH MAINTENANCE: Social History  Substance Use Topics  . Smoking status: Former Research scientist (life sciences)  . Smokeless tobacco: Never Used  . Alcohol use No     Colonoscopy:  PAP:  Bone density:  Lipid panel:  Allergies  Allergen Reactions  . Ace Inhibitors Anaphylaxis  . Allopurinol Anaphylaxis  . Angiotensin Receptor Blockers Anaphylaxis  . Codeine Anaphylaxis  . Enalapril Swelling  Other reaction(s): SWELLING/EDEMA  . Furosemide Swelling    Other reaction(s): OTHER  . Hydralazine Swelling    Other reaction(s): SWELLING/EDEMA  . Hydrochlorothiazide Anaphylaxis  . Irbesartan Swelling    Other reaction(s): SWELLING/EDEMA  . Levofloxacin Swelling    Other reaction(s): SWELLING/EDEMA  . Lidocaine Swelling    Other reaction(s):  SWELLING/EDEMA  . Naproxen Sodium Anaphylaxis  . Spironolactone Anaphylaxis  . Tramadol Anaphylaxis    Other reaction(s): NAUSEA    Current Outpatient Prescriptions  Medication Sig Dispense Refill  . acetaminophen (TYLENOL) 500 MG tablet Take 500 mg by mouth 2 (two) times daily as needed for moderate pain.    Marland Kitchen alendronate (FOSAMAX) 70 MG tablet Take 70 mg by mouth every Friday.     Marland Kitchen amLODipine (NORVASC) 10 MG tablet Take 10 mg by mouth at bedtime.     Marland Kitchen aspirin EC 81 MG tablet Take 81 mg by mouth daily.     . Calcium Carb-Cholecalciferol 600-800 MG-UNIT TABS Take 1 tablet by mouth daily.     . carvedilol (COREG) 25 MG tablet Take 50 mg by mouth 2 (two) times daily with a meal.     . cetirizine (ZYRTEC) 10 MG tablet Take 10 mg by mouth at bedtime.     . cloNIDine (CATAPRES - DOSED IN MG/24 HR) 0.3 mg/24hr patch Place 0.3 mg onto the skin once a week. Change patch on Wednesdays    . colchicine-probenecid 0.5-500 MG tablet Take 1 tablet by mouth 2 (two) times daily.     Marland Kitchen dexamethasone (DECADRON) 4 MG tablet Take 1 tablet (4 mg total) by mouth daily. 30 tablet 1  . docusate sodium (COLACE) 100 MG capsule Take 200-400 mg by mouth at bedtime.     Marland Kitchen erythromycin ophthalmic ointment Apply to eyelid margins every night    . esomeprazole (NEXIUM) 40 MG capsule Take 40 mg by mouth daily.     Marland Kitchen gabapentin (NEURONTIN) 300 MG capsule Take 300 mg by mouth 3 (three) times daily.     . Hypromellose (ARTIFICIAL TEARS OP) Apply 1 drop to eye daily as needed (dry eyes).    Marland Kitchen levETIRAcetam (KEPPRA) 500 MG tablet Take 1 tablet (500 mg total) by mouth 2 (two) times daily. 60 tablet 2  . Melatonin 10 MG TABS Take 20 mg by mouth at bedtime.    . ondansetron (ZOFRAN) 8 MG tablet Take 1 tablet (8 mg total) by mouth 2 (two) times daily as needed for refractory nausea / vomiting. 60 tablet 2  . prochlorperazine (COMPAZINE) 10 MG tablet Take 1 tablet (10 mg total) by mouth every 6 (six) hours as needed (Nausea or  vomiting). 60 tablet 2  . simvastatin (ZOCOR) 20 MG tablet Take 20 mg by mouth every evening.     Marland Kitchen amoxicillin-clavulanate (AUGMENTIN) 875-125 MG tablet Take 1 tablet by mouth 2 (two) times daily. For 7 days. (Patient not taking: Reported on 05/25/2017) 14 tablet 0   No current facility-administered medications for this visit.     OBJECTIVE: Vitals:   05/25/17 1011  BP: 133/69  Pulse: 73  Resp: 18  Temp: (!) 96.3 F (35.7 C)     Body mass index is 23.53 kg/m.    ECOG FS:1 - Symptomatic but completely ambulatory  GENERAL:  Elderly woman sitting comfortably in a wheelchair in the exam room in no acute distress.  She is accompanied by her daughter, [name]. MENTAL STATUS:  Alert and oriented to person, place and time. HEAD: Wearing a turquoise head.  Alopecia.  Normocephalic, atraumatic, face symmetric, no Cushingoid features. EYES:  Blue eyes.  Pupils equal round and reactive to light and accomodation.  No conjunctivitis or scleral icterus. ENT:  Oropharynx clear without lesion.  Tongue normal. Mucous membranes moist.  RESPIRATORY:  Clear to auscultation without rales, wheezes or rhonchi. CARDIOVASCULAR:  Regular rate and rhythm without murmur, rub or gallop. ABDOMEN:  Soft, non-tender, with active bowel sounds, and no hepatosplenomegaly.  No masses. SKIN:  No rashes, ulcers or lesions. EXTREMITIES: No edema, no skin discoloration or tenderness.  No palpable cords. LYMPH NODES: No palpable cervical, supraclavicular, axillary or inguinal adenopathy  NEUROLOGICAL: No focal weakness. PSYCH:  Appropriate.   LAB RESULTS:  Lab Results  Component Value Date   NA 131 (L) 05/25/2017   K 3.7 05/25/2017   CL 93 (L) 05/25/2017   CO2 27 05/25/2017   GLUCOSE 283 (H) 05/25/2017   BUN 20 05/25/2017   CREATININE 0.93 05/25/2017   CALCIUM 8.4 (L) 05/25/2017   PROT 5.8 (L) 05/25/2017   ALBUMIN 3.2 (L) 05/25/2017   AST 21 05/25/2017   ALT 21 05/25/2017   ALKPHOS 99 05/25/2017   BILITOT 0.7  05/25/2017   GFRNONAA 57 (L) 05/25/2017   GFRAA >60 05/25/2017    Lab Results  Component Value Date   WBC 16.9 (H) 05/25/2017   NEUTROABS 13.1 (H) 05/25/2017   HGB 11.7 (L) 05/25/2017   HCT 34.3 (L) 05/25/2017   MCV 94.3 05/25/2017   PLT 254 05/25/2017     STUDIES: Nm Pet Image Restag (ps) Skull Base To Thigh  Result Date: 05/25/2017 CLINICAL DATA:  Subsequent treatment strategy for Lung cancer. EXAM: NUCLEAR MEDICINE PET SKULL BASE TO THIGH TECHNIQUE: 12.6 mCi F-18 FDG was injected intravenously. Full-ring PET imaging was performed from the skull base to thigh after the radiotracer. CT data was obtained and used for attenuation correction and anatomic localization. FASTING BLOOD GLUCOSE:  Value: 170 mg/dl COMPARISON:  None FINDINGS: NECK: Asymmetric mucosal thickening involving the right maxillary sinus noted. CHEST: Aortic atherosclerosis. Calcification within the LAD coronary artery noted. Moderate changes of centrilobular emphysema. 4 mm right lower lobe lung nodule noted. Rise. Posterior left lower lobe pulmonary nodule measures 4 mm. Also too small to reliably characterize. 7 mm nodule within the right upper lobe is identified, image 82 of series 4. This is too small to reliably characterize by PET-CT. SUV max is equal to 0.86. No hypermetabolic mediastinal or hilar nodes. No suspicious pulmonary nodules on the CT scan. ABDOMEN/PELVIS: No abnormal uptake identified within the liver. Intermediate attenuating structure within the lateral segment of left lobe of liver without corresponding FDG uptake. Likely benign hemangioma when compared with 05/27/2005. Gallstone identified. The pancreas is unremarkable. Normal appearance of the spleen. Aortic atherosclerosis. SKELETON: There is a mildly heterogeneous radiotracer uptake identified within new proximal appendicular skeleton. For example, within the left femoral diaphysis there is a area of uptake within SUV max equal to 2.8. Asymmetric  increased uptake within the proximal right humerus has an SUV max equal to 1.99. IMPRESSION: 1. No evidence to suggest hypermetabolic mass or adenopathy. 2. There are few small pulmonary nodules identified in the lungs which ir too small to characterize measuring up to 7 mm. 3. Mild heterogeneous radiotracer activity is identified within the axial appendicular skeleton. 4.  Aortic Atherosclerosis (ICD10-I70.0). 5.  Emphysema (ICD10-J43.9). 6. Gallstone. Electronically Signed   By: Kerby Moors M.D.   On: 05/25/2017 09:17    ASSESSMENT: Stage IVB small cell lung  cancer with bilateral pulmonary nodules as well as an isolated lesion in her brain.   PLAN:    1. 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 No bone, hepatic, or other suspicious lesions were noted outside the brain. Primary lesion appears to be a 1.8 cm right upper lobe nodule. Patient was noted to have a 1.6 cm left hepatic lesion, but this was indeterminate and possibly a hemangioma. Liver lesion was PET negative.   She has received 4 cycles of carboplatin and etoposide. PET scan reveals no metabolic disease.  Discuss plans for no chemotherapy today.    2. Metastatic brain lesion: MRI the brain completed at [facility] on 03/082018 revealed a 4.5 x 3.4 x 4.3 lesion with rim enhancement in the right frontal lobe. Patient completed cranial XRT on 01/20/2017.  She remains on Decadron 4 mg a day.  Discuss plans for reimaging.  Head MRI ordered.  3. Seizures: She has no new obvious metastatic lesions, but patient continues to have vasogenic edema. Follow-up head MRI.  Continue Decadron and Keppra as prescribed.  Anticipate slow taper of Decadron after scans next week.  4. Lower extremity edema: Ultrasound Doppler negative for DVT. Monitor.  5.  Cough/congestion: Resolved.  6. Leukocytosis: Multifactorial.  Likely due to Decadron and Neulasta.  No intervention needed.                                                                                                                                                    Patient expressed understanding and was in agreement with this plan. She also understands that She can call clinic at any time with any questions, concerns, or complaints.   Cancer Staging Small cell lung cancer [facility]) Staging form: Lung, AJCC 8th Edition - Clinical stage from 01/31/2017: Stage IVB (cT1b, cN2, cM1c) - Signed by Lloyd Huger, MD on 01/31/2017   Lequita Asal, MD   05/25/2017 10:34 AM

## 2017-05-25 ENCOUNTER — Inpatient Hospital Stay (HOSPITAL_BASED_OUTPATIENT_CLINIC_OR_DEPARTMENT_OTHER): Payer: Medicare HMO | Admitting: Hematology and Oncology

## 2017-05-25 ENCOUNTER — Inpatient Hospital Stay: Payer: Medicare HMO

## 2017-05-25 ENCOUNTER — Encounter: Payer: Self-pay | Admitting: Hematology and Oncology

## 2017-05-25 VITALS — BP 133/69 | HR 73 | Temp 96.3°F | Resp 18 | Wt 141.4 lb

## 2017-05-25 DIAGNOSIS — I700 Atherosclerosis of aorta: Secondary | ICD-10-CM

## 2017-05-25 DIAGNOSIS — C3491 Malignant neoplasm of unspecified part of right bronchus or lung: Secondary | ICD-10-CM | POA: Diagnosis not present

## 2017-05-25 DIAGNOSIS — Z7982 Long term (current) use of aspirin: Secondary | ICD-10-CM

## 2017-05-25 DIAGNOSIS — R569 Unspecified convulsions: Secondary | ICD-10-CM

## 2017-05-25 DIAGNOSIS — Z5111 Encounter for antineoplastic chemotherapy: Secondary | ICD-10-CM | POA: Diagnosis not present

## 2017-05-25 DIAGNOSIS — C3490 Malignant neoplasm of unspecified part of unspecified bronchus or lung: Secondary | ICD-10-CM

## 2017-05-25 DIAGNOSIS — E119 Type 2 diabetes mellitus without complications: Secondary | ICD-10-CM

## 2017-05-25 DIAGNOSIS — C7931 Secondary malignant neoplasm of brain: Secondary | ICD-10-CM | POA: Diagnosis not present

## 2017-05-25 DIAGNOSIS — R5383 Other fatigue: Secondary | ICD-10-CM

## 2017-05-25 DIAGNOSIS — D72829 Elevated white blood cell count, unspecified: Secondary | ICD-10-CM | POA: Diagnosis not present

## 2017-05-25 DIAGNOSIS — R531 Weakness: Secondary | ICD-10-CM

## 2017-05-25 DIAGNOSIS — I10 Essential (primary) hypertension: Secondary | ICD-10-CM | POA: Diagnosis not present

## 2017-05-25 DIAGNOSIS — Z87891 Personal history of nicotine dependence: Secondary | ICD-10-CM

## 2017-05-25 DIAGNOSIS — Z923 Personal history of irradiation: Secondary | ICD-10-CM

## 2017-05-25 DIAGNOSIS — R918 Other nonspecific abnormal finding of lung field: Secondary | ICD-10-CM | POA: Diagnosis not present

## 2017-05-25 DIAGNOSIS — Z95828 Presence of other vascular implants and grafts: Secondary | ICD-10-CM

## 2017-05-25 DIAGNOSIS — R609 Edema, unspecified: Secondary | ICD-10-CM | POA: Diagnosis not present

## 2017-05-25 DIAGNOSIS — C801 Malignant (primary) neoplasm, unspecified: Secondary | ICD-10-CM

## 2017-05-25 LAB — COMPREHENSIVE METABOLIC PANEL
ALBUMIN: 3.2 g/dL — AB (ref 3.5–5.0)
ALT: 21 U/L (ref 14–54)
AST: 21 U/L (ref 15–41)
Alkaline Phosphatase: 99 U/L (ref 38–126)
Anion gap: 11 (ref 5–15)
BILIRUBIN TOTAL: 0.7 mg/dL (ref 0.3–1.2)
BUN: 20 mg/dL (ref 6–20)
CHLORIDE: 93 mmol/L — AB (ref 101–111)
CO2: 27 mmol/L (ref 22–32)
Calcium: 8.4 mg/dL — ABNORMAL LOW (ref 8.9–10.3)
Creatinine, Ser: 0.93 mg/dL (ref 0.44–1.00)
GFR calc Af Amer: >60 mL/min (ref >60)
GFR calc non Af Amer: 57 mL/min — ABNORMAL LOW (ref >60)
Glucose, Bld: 283 mg/dL — ABNORMAL HIGH (ref 65–99)
POTASSIUM: 3.7 mmol/L (ref 3.5–5.1)
SODIUM: 131 mmol/L — AB (ref 135–145)
TOTAL PROTEIN: 5.8 g/dL — AB (ref 6.5–8.1)

## 2017-05-25 LAB — CBC WITH DIFFERENTIAL/PLATELET
BASOS ABS: 0.1 10*3/uL (ref 0–0.1)
BASOS PCT: 0 %
EOS ABS: 0.1 10*3/uL (ref 0–0.7)
Eosinophils Relative: 0 %
HEMATOCRIT: 34.3 % — AB (ref 35.0–47.0)
HEMOGLOBIN: 11.7 g/dL — AB (ref 12.0–16.0)
Lymphocytes Relative: 10 %
Lymphs Abs: 1.7 10*3/uL (ref 1.0–3.6)
MCH: 32.1 pg (ref 26.0–34.0)
MCHC: 34 g/dL (ref 32.0–36.0)
MCV: 94.3 fL (ref 80.0–100.0)
MONO ABS: 2 10*3/uL — AB (ref 0.2–0.9)
Monocytes Relative: 12 %
NEUTROS ABS: 13.1 10*3/uL — AB (ref 1.4–6.5)
NEUTROS PCT: 78 %
Platelets: 254 10*3/uL (ref 150–440)
RBC: 3.64 MIL/uL — ABNORMAL LOW (ref 3.80–5.20)
RDW: 17.8 % — AB (ref 11.5–14.5)
WBC: 16.9 10*3/uL — ABNORMAL HIGH (ref 3.6–11.0)

## 2017-05-25 MED ORDER — HEPARIN SOD (PORK) LOCK FLUSH 100 UNIT/ML IV SOLN
500.0000 [IU] | Freq: Once | INTRAVENOUS | Status: AC
  Administered 2017-05-25: 500 [IU] via INTRAVENOUS
  Filled 2017-05-25: qty 5

## 2017-05-25 MED ORDER — SODIUM CHLORIDE 0.9% FLUSH
10.0000 mL | INTRAVENOUS | Status: DC | PRN
  Administered 2017-05-25: 10 mL via INTRAVENOUS
  Filled 2017-05-25: qty 10

## 2017-05-25 NOTE — Progress Notes (Signed)
Patient is having constipation.  States she is eating good.  Having lower back pain.  Patient presents today in wheelchair accompanied by her daughter.

## 2017-05-29 ENCOUNTER — Other Ambulatory Visit: Payer: Self-pay | Admitting: *Deleted

## 2017-05-29 ENCOUNTER — Telehealth: Payer: Self-pay | Admitting: *Deleted

## 2017-05-29 MED ORDER — AMOXICILLIN-POT CLAVULANATE 875-125 MG PO TABS: 1.0000 | ORAL_TABLET | Freq: Two times a day (BID) | ORAL | 0 refills | Status: DC

## 2017-05-29 NOTE — Telephone Encounter (Signed)
Patient's daughter called requesting refill for Augmentin. Medication refilled per Dr. Janese Banks for 5 days. Daughter aware.

## 2017-06-01 ENCOUNTER — Ambulatory Visit
Admission: RE | Admit: 2017-06-01 | Discharge: 2017-06-01 | Disposition: A | Discharge status: Home / Self Care | Payer: Medicare HMO | Source: Ambulatory Visit | Attending: Hematology and Oncology | Admitting: Hematology and Oncology

## 2017-06-01 DIAGNOSIS — R9089 Other abnormal findings on diagnostic imaging of central nervous system: Secondary | ICD-10-CM | POA: Diagnosis not present

## 2017-06-01 DIAGNOSIS — Z9889 Other specified postprocedural states: Secondary | ICD-10-CM | POA: Diagnosis not present

## 2017-06-01 DIAGNOSIS — C7931 Secondary malignant neoplasm of brain: Secondary | ICD-10-CM | POA: Insufficient documentation

## 2017-06-01 DIAGNOSIS — R531 Weakness: Secondary | ICD-10-CM | POA: Diagnosis not present

## 2017-06-01 MED ORDER — GADOBENATE DIMEGLUMINE 529 MG/ML IV SOLN
15.0000 mL | Freq: Once | INTRAVENOUS | Status: AC | PRN
Start: 2017-06-01 — End: 2017-06-01
  Administered 2017-06-01: 13 mL via INTRAVENOUS

## 2017-06-03 ENCOUNTER — Inpatient Hospital Stay: Payer: Medicare HMO | Attending: Oncology | Admitting: Oncology

## 2017-06-03 VITALS — BP 144/70 | Temp 96.7°F | Resp 18 | Wt 142.7 lb

## 2017-06-03 DIAGNOSIS — Z8041 Family history of malignant neoplasm of ovary: Secondary | ICD-10-CM | POA: Insufficient documentation

## 2017-06-03 DIAGNOSIS — Z923 Personal history of irradiation: Secondary | ICD-10-CM | POA: Diagnosis not present

## 2017-06-03 DIAGNOSIS — C3490 Malignant neoplasm of unspecified part of unspecified bronchus or lung: Secondary | ICD-10-CM

## 2017-06-03 DIAGNOSIS — Z7982 Long term (current) use of aspirin: Secondary | ICD-10-CM | POA: Diagnosis not present

## 2017-06-03 DIAGNOSIS — M4802 Spinal stenosis, cervical region: Secondary | ICD-10-CM

## 2017-06-03 DIAGNOSIS — E119 Type 2 diabetes mellitus without complications: Secondary | ICD-10-CM | POA: Diagnosis not present

## 2017-06-03 DIAGNOSIS — R609 Edema, unspecified: Secondary | ICD-10-CM | POA: Diagnosis not present

## 2017-06-03 DIAGNOSIS — D72829 Elevated white blood cell count, unspecified: Secondary | ICD-10-CM | POA: Diagnosis not present

## 2017-06-03 DIAGNOSIS — Z79899 Other long term (current) drug therapy: Secondary | ICD-10-CM | POA: Insufficient documentation

## 2017-06-03 DIAGNOSIS — I10 Essential (primary) hypertension: Secondary | ICD-10-CM | POA: Insufficient documentation

## 2017-06-03 DIAGNOSIS — J439 Emphysema, unspecified: Secondary | ICD-10-CM | POA: Diagnosis not present

## 2017-06-03 DIAGNOSIS — K8020 Calculus of gallbladder without cholecystitis without obstruction: Secondary | ICD-10-CM | POA: Insufficient documentation

## 2017-06-03 DIAGNOSIS — R531 Weakness: Secondary | ICD-10-CM

## 2017-06-03 DIAGNOSIS — I700 Atherosclerosis of aorta: Secondary | ICD-10-CM | POA: Insufficient documentation

## 2017-06-03 DIAGNOSIS — R569 Unspecified convulsions: Secondary | ICD-10-CM | POA: Diagnosis not present

## 2017-06-03 DIAGNOSIS — C7931 Secondary malignant neoplasm of brain: Secondary | ICD-10-CM | POA: Diagnosis not present

## 2017-06-03 DIAGNOSIS — R5383 Other fatigue: Secondary | ICD-10-CM

## 2017-06-03 DIAGNOSIS — Z87891 Personal history of nicotine dependence: Secondary | ICD-10-CM | POA: Diagnosis not present

## 2017-06-03 NOTE — Progress Notes (Signed)
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   INTERVAL HISTORY: Patient returns to clinic today for further evaluation and discussion of her imaging results. She currently feels well and is back to her baseline. She continues to have mild weakness and fatigue. She denies any fevers. She has no further neurologic complaints. She has a fair appetite, but denies weight loss. She has no chest pain, shortness of breath, or hemoptysis. She denies any nausea, vomiting, constipation, or diarrhea. She has no urinary complaints. Patient offers no further specific complaints today.  REVIEW OF SYSTEMS:   Review of Systems  Constitutional: Positive for malaise/fatigue. Negative for fever and weight loss.  Respiratory: Negative for cough, hemoptysis, sputum production and shortness of breath.   Cardiovascular: Negative.  Negative for chest pain and leg swelling.  Gastrointestinal: Negative.  Negative for abdominal pain.  Genitourinary: Negative.   Musculoskeletal: Negative.   Skin: Negative.  Negative for rash.  Neurological: Positive for focal weakness and weakness. Negative for dizziness, sensory change, seizures and headaches.  Psychiatric/Behavioral: Negative.  The patient is not nervous/anxious.     As per HPI. Otherwise, a complete review of systems is negative.  PAST MEDICAL HISTORY: Past Medical History:  Diagnosis Date  . Brain cancer ([place])   . Diabetes mellitus without complication ([place])   . Hypertension   . Lung cancer ([place])     PAST SURGICAL HISTORY: Past Surgical History:  Procedure Laterality Date  . ABDOMINAL HYSTERECTOMY     partial  . APPENDECTOMY    . PORTA CATH  INSERTION N/A 02/04/2017   Procedure: [name];  Surgeon: Algernon Huxley, MD;  Location: [facility];  Service: Cardiovascular;  Laterality: N/A;    FAMILY HISTORY: Family History  Problem Relation Age of Onset  . Diabetes Mother   . Melanoma Sister   . Ovarian cancer Sister   . Diabetes Sister   . Diabetes Brother   . Diabetes Maternal Grandmother   . Diabetes Paternal Grandmother     ADVANCED DIRECTIVES (Y/N):  N  HEALTH MAINTENANCE: Social History  Substance Use Topics  . Smoking status: Former Research scientist (life sciences)  . Smokeless tobacco: Never Used  . Alcohol use No     Colonoscopy:  PAP:  Bone density:  Lipid panel:  Allergies  Allergen Reactions  . Ace Inhibitors Anaphylaxis  . Allopurinol Anaphylaxis  . Angiotensin Receptor Blockers Anaphylaxis  . Codeine Anaphylaxis  . Enalapril Swelling    Other reaction(s): SWELLING/EDEMA  . Furosemide Swelling    Other reaction(s): OTHER  . Hydralazine Swelling    Other reaction(s): SWELLING/EDEMA  . Hydrochlorothiazide Anaphylaxis  . Irbesartan Swelling    Other reaction(s): SWELLING/EDEMA  . Levofloxacin Swelling    Other reaction(s): SWELLING/EDEMA  . Lidocaine Swelling    Other reaction(s): SWELLING/EDEMA  . Naproxen Sodium Anaphylaxis  . Spironolactone Anaphylaxis  . Tramadol Anaphylaxis    Other reaction(s): NAUSEA    Current Outpatient Prescriptions  Medication Sig Dispense Refill  . acetaminophen (TYLENOL) 500 MG tablet Take 500 mg by mouth 2 (two) times daily as needed for moderate pain.    Marland Kitchen alendronate (FOSAMAX) 70 MG tablet Take 70 mg by mouth every Friday.     Marland Kitchen  amLODipine (NORVASC) 10 MG tablet Take 10 mg by mouth at bedtime.     Marland Kitchen amoxicillin-clavulanate (AUGMENTIN) 875-125 MG tablet Take 1 tablet by mouth 2 (two) times daily. For 7 days. 10 tablet 0  . aspirin EC 81 MG tablet Take 81 mg by mouth daily.     . Calcium Carb-Cholecalciferol 600-800 MG-UNIT TABS Take 1 tablet by mouth daily.     .  carvedilol (COREG) 25 MG tablet Take 50 mg by mouth 2 (two) times daily with a meal.     . cetirizine (ZYRTEC) 10 MG tablet Take 10 mg by mouth at bedtime.     . cloNIDine (CATAPRES - DOSED IN MG/24 HR) 0.3 mg/24hr patch Place 0.3 mg onto the skin once a week. Change patch on Wednesdays    . colchicine-probenecid 0.5-500 MG tablet Take 1 tablet by mouth 2 (two) times daily.     Marland Kitchen dexamethasone (DECADRON) 4 MG tablet Take 1 tablet (4 mg total) by mouth daily. 30 tablet 1  . docusate sodium (COLACE) 100 MG capsule Take 200-400 mg by mouth at bedtime.     Marland Kitchen erythromycin ophthalmic ointment Apply to eyelid margins every night    . esomeprazole (NEXIUM) 40 MG capsule Take 40 mg by mouth daily.     Marland Kitchen gabapentin (NEURONTIN) 300 MG capsule Take 300 mg by mouth 3 (three) times daily.     . Hypromellose (ARTIFICIAL TEARS OP) Apply 1 drop to eye daily as needed (dry eyes).    Marland Kitchen levETIRAcetam (KEPPRA) 500 MG tablet Take 1 tablet (500 mg total) by mouth 2 (two) times daily. 60 tablet 2  . Melatonin 10 MG TABS Take 20 mg by mouth at bedtime.    . ondansetron (ZOFRAN) 8 MG tablet Take 1 tablet (8 mg total) by mouth 2 (two) times daily as needed for refractory nausea / vomiting. 60 tablet 2  . prochlorperazine (COMPAZINE) 10 MG tablet Take 1 tablet (10 mg total) by mouth every 6 (six) hours as needed (Nausea or vomiting). 60 tablet 2  . simvastatin (ZOCOR) 20 MG tablet Take 20 mg by mouth every evening.      No current facility-administered medications for this visit.     OBJECTIVE: Vitals:   06/03/17 1147  BP: (!) 144/70  Resp: 18  Temp: (!) 96.7 F (35.9 C)     Body mass index is 23.75 kg/m.    ECOG FS:1 - Symptomatic but completely ambulatory  General: Well-developed, well-nourished, no acute distress. Eyes: Pink conjunctiva, anicteric sclera. Lungs: Clear to auscultation bilaterally. Heart: Regular rate and rhythm. No rubs, murmurs, or gallops. Abdomen: Soft, nontender, nondistended. No  organomegaly noted, normoactive bowel sounds. Musculoskeletal: No edema, cyanosis, or clubbing. Neuro: Alert, answering all questions appropriately. Cranial nerves grossly intact. Skin: No rashes or petechiae noted. Psych: Normal affect.   LAB RESULTS:  Lab Results  Component Value Date   NA 131 (L) 05/25/2017   K 3.7 05/25/2017   CL 93 (L) 05/25/2017   CO2 27 05/25/2017   GLUCOSE 283 (H) 05/25/2017   BUN 20 05/25/2017   CREATININE 0.93 05/25/2017   CALCIUM 8.4 (L) 05/25/2017   PROT 5.8 (L) 05/25/2017   ALBUMIN 3.2 (L) 05/25/2017   AST 21 05/25/2017   ALT 21 05/25/2017   ALKPHOS 99 05/25/2017   BILITOT 0.7 05/25/2017   GFRNONAA 57 (L) 05/25/2017   GFRAA >60 05/25/2017    Lab Results  Component Value Date   WBC 16.9 (H) 05/25/2017   NEUTROABS 13.1 (  H) 05/25/2017   HGB 11.7 (L) 05/25/2017   HCT 34.3 (L) 05/25/2017   MCV 94.3 05/25/2017   PLT 254 05/25/2017     STUDIES: Mr Jeri Cos SW Contrast  Result Date: 06/01/2017 CLINICAL DATA:  History of small cell lung cancer brain metastasis, status post radiation. Weakness. Follow-up evaluation. History of hypertension and diabetes. EXAM: MRI HEAD WITHOUT AND WITH CONTRAST TECHNIQUE: Multiplanar, multiecho pulse sequences of the brain and surrounding structures were obtained without and with intravenous contrast. CONTRAST:  58 cc MultiHance COMPARISON:  MRI of the head Feb 26, 2017 FINDINGS: INTRACRANIAL CONTENTS: Susceptibility artifact RIGHT frontal convexity, corresponding to known metastasis which has decreased in size: Previously 2.6 x 3 cm, now 0.8 x 1.4 cm. Mild residual vasogenic edema, improved from prior imaging. No new metastasis. No satellite areas of hemorrhage. Ventricles and sulci are normal for patient's age. Increasing generalized confluent supratentorial white matter FLAIR T2 hyperintensities the patchy pontine white matter T2 hyperintensities. No reduced diffusion to suggest acute ischemia or hypercellular tumor. No  midline shift or mass effect. No abnormal extra-axial fluid collections. No abnormal extra-axial enhancement. VASCULAR: Normal major intracranial vascular flow voids present at skull base. SKULL AND UPPER CERVICAL SPINE: No abnormal sellar expansion. No suspicious calvarial bone marrow signal. Craniocervical junction maintained. Severe degenerative change included cervical spine with at least moderate canal stenosis C3-4, unchanged. SINUSES/ORBITS: The mastoid air-cells and included paranasal sinuses are well-aerated.The included ocular globes and orbital contents are non-suspicious. Status post bilateral ocular lens implants. OTHER: Patient is edentulous. IMPRESSION: 1. Treatment response, decreased size of solitary RIGHT frontal lobe metastasis. 2. Increasing white matter signal abnormality complete with compatible with treatment related changes. 3. No acute intracranial process. Electronically Signed   By: Elon Alas M.D.   On: 06/01/2017 13:36   Nm Pet Image Restag (ps) Skull Base To Thigh  Result Date: 05/25/2017 CLINICAL DATA:  Subsequent treatment strategy for Lung cancer. EXAM: NUCLEAR MEDICINE PET SKULL BASE TO THIGH TECHNIQUE: 12.6 mCi F-18 FDG was injected intravenously. Full-ring PET imaging was performed from the skull base to thigh after the radiotracer. CT data was obtained and used for attenuation correction and anatomic localization. FASTING BLOOD GLUCOSE:  Value: 170 mg/dl COMPARISON:  None FINDINGS: NECK: Asymmetric mucosal thickening involving the right maxillary sinus noted. CHEST: Aortic atherosclerosis. Calcification within the LAD coronary artery noted. Moderate changes of centrilobular emphysema. 4 mm right lower lobe lung nodule noted. Rise. Posterior left lower lobe pulmonary nodule measures 4 mm. Also too small to reliably characterize. 7 mm nodule within the right upper lobe is identified, image 82 of series 4. This is too small to reliably characterize by PET-CT. SUV max is  equal to 0.86. No hypermetabolic mediastinal or hilar nodes. No suspicious pulmonary nodules on the CT scan. ABDOMEN/PELVIS: No abnormal uptake identified within the liver. Intermediate attenuating structure within the lateral segment of left lobe of liver without corresponding FDG uptake. Likely benign hemangioma when compared with 05/27/2005. Gallstone identified. The pancreas is unremarkable. Normal appearance of the spleen. Aortic atherosclerosis. SKELETON: There is a mildly heterogeneous radiotracer uptake identified within new proximal appendicular skeleton. For example, within the left femoral diaphysis there is a area of uptake within SUV max equal to 2.8. Asymmetric increased uptake within the proximal right humerus has an SUV max equal to 1.99. IMPRESSION: 1. No evidence to suggest hypermetabolic mass or adenopathy. 2. There are few small pulmonary nodules identified in the lungs which ir too small to characterize measuring up to  7 mm. 3. Mild heterogeneous radiotracer activity is identified within the axial appendicular skeleton. 4.  Aortic Atherosclerosis (ICD10-I70.0). 5.  Emphysema (ICD10-J43.9). 6. Gallstone. Electronically Signed   By: Kerby Moors M.D.   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   PLAN:    1. 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 No bone, hepatic, or other suspicious lesions were noted outside the brain. Primary lesion appears to be a 1.8 cm right upper lobe nodule. Patient was noted to have a 1.6 cm left hepatic lesion, but this was indeterminate and possibly a hemangioma. Liver lesion was PET negative. Patient completed XRT her brain. PET scan results and MRI the  brain results reviewed independently and reported as above with no evidence to suggest recurrent or progressive disease. No further intervention is needed at this time. Patient does not require additional chemotherapy currently. Return to clinic in 3 months with repeat imaging and further evaluation.   2. Metastatic brain lesion: MRI the brain [facility] at [facility] on January 01, 2017 revealed a 4.5 x 3.4 x 4.3 lesion with rim enhancement in the right frontal lobe. Patient has completed XRT. MRI results as above. 3. Seizures: She has no new obvious metastatic lesions. Continue Decadron and Keppra as prescribed. 4. Lower extremity edema: Ultrasound Doppler negative for DVT. Monitor. 5. Cough/congestion: Resolved. 6. Leukocytosis: Multifactorial, improved. Monitor.                                                                                                                                                    Patient expressed understanding and was in agreement with this plan. She also understands that She can call clinic at any time with any questions, concerns, or complaints.   Cancer Staging Small cell lung cancer [facility]) Staging form: Lung, AJCC 8th Edition - Clinical stage from 01/31/2017: Stage IVB (cT1b, cN2, cM1c) - Signed by Lloyd Huger, MD on 01/31/2017   Lloyd Huger, MD   06/06/2017 4:15 PM

## 2017-07-01 ENCOUNTER — Other Ambulatory Visit: Payer: Self-pay | Admitting: *Deleted

## 2017-07-01 MED ORDER — LEVETIRACETAM 500 MG PO TABS: 500.0000 mg | ORAL_TABLET | Freq: Two times a day (BID) | ORAL | 2 refills | Status: AC

## 2017-07-01 MED ORDER — LEVETIRACETAM 500 MG PO TABS
500.0000 mg | ORAL_TABLET | Freq: Two times a day (BID) | ORAL | 2 refills | Status: DC
Start: 2017-07-01 — End: 2017-07-01

## 2017-07-01 MED ORDER — DEXAMETHASONE 4 MG PO TABS: 4.0000 mg | ORAL_TABLET | Freq: Every day | ORAL | 1 refills | Status: DC

## 2017-07-01 NOTE — Telephone Encounter (Signed)
Daughter called to report that she is having problems since coming off steroids. Tremors In left arm and right knee times past 5 days. This is how her seizures started. She fell backwards Monday afternoon and was checked by EMS and found to be OK. Asking that she go back on steroids. Reports that she is falling asleep easily and frequently.Please advise

## 2017-07-01 NOTE — Telephone Encounter (Signed)
Per VO Dr Grayland Ormond, reasonable to restart Decadron 4 mg daily and OK to refill Keppra. Shelia notified confirmed [place]

## 2017-07-01 NOTE — Addendum Note (Signed)
Addended by: Betti Cruz on: 07/01/2017 11:46 AM   Modules accepted: Orders

## 2017-07-13 ENCOUNTER — Inpatient Hospital Stay: Payer: Medicare HMO | Attending: Oncology

## 2017-07-13 DIAGNOSIS — Z452 Encounter for adjustment and management of vascular access device: Secondary | ICD-10-CM | POA: Insufficient documentation

## 2017-07-13 DIAGNOSIS — C3411 Malignant neoplasm of upper lobe, right bronchus or lung: Secondary | ICD-10-CM | POA: Insufficient documentation

## 2017-07-13 DIAGNOSIS — C7931 Secondary malignant neoplasm of brain: Secondary | ICD-10-CM | POA: Insufficient documentation

## 2017-07-17 ENCOUNTER — Inpatient Hospital Stay: Payer: Medicare HMO

## 2017-07-17 DIAGNOSIS — C7931 Secondary malignant neoplasm of brain: Secondary | ICD-10-CM | POA: Diagnosis not present

## 2017-07-17 DIAGNOSIS — Z452 Encounter for adjustment and management of vascular access device: Secondary | ICD-10-CM | POA: Diagnosis not present

## 2017-07-17 DIAGNOSIS — Z95828 Presence of other vascular implants and grafts: Secondary | ICD-10-CM

## 2017-07-17 DIAGNOSIS — C3411 Malignant neoplasm of upper lobe, right bronchus or lung: Secondary | ICD-10-CM | POA: Diagnosis not present

## 2017-07-17 MED ORDER — SODIUM CHLORIDE 0.9% FLUSH
10.0000 mL | INTRAVENOUS | Status: AC | PRN
  Administered 2017-07-17: 10 mL
  Filled 2017-07-17: qty 10

## 2017-07-17 MED ORDER — HEPARIN SOD (PORK) LOCK FLUSH 100 UNIT/ML IV SOLN
500.0000 [IU] | INTRAVENOUS | Status: AC | PRN
  Administered 2017-07-17: 500 [IU]

## 2017-07-20 ENCOUNTER — Telehealth: Payer: Self-pay | Admitting: *Deleted

## 2017-07-20 NOTE — Telephone Encounter (Signed)
Requesting orders for PT to be sent. Also requesting a call back once done

## 2017-07-20 NOTE — Telephone Encounter (Signed)
That's fine

## 2017-07-20 NOTE — Telephone Encounter (Signed)
She is not currently not under home health, but used to be with [facility], open to any agency who will understand her limitations

## 2017-07-20 NOTE — Telephone Encounter (Signed)
Will fax referral to [facility].

## 2017-07-21 DIAGNOSIS — G40909 Epilepsy, unspecified, not intractable, without status epilepticus: Secondary | ICD-10-CM | POA: Diagnosis not present

## 2017-07-21 DIAGNOSIS — Z87891 Personal history of nicotine dependence: Secondary | ICD-10-CM | POA: Diagnosis not present

## 2017-07-21 DIAGNOSIS — I10 Essential (primary) hypertension: Secondary | ICD-10-CM | POA: Diagnosis not present

## 2017-07-21 DIAGNOSIS — E119 Type 2 diabetes mellitus without complications: Secondary | ICD-10-CM | POA: Diagnosis not present

## 2017-07-21 DIAGNOSIS — C7931 Secondary malignant neoplasm of brain: Secondary | ICD-10-CM | POA: Diagnosis not present

## 2017-07-21 DIAGNOSIS — M6281 Muscle weakness (generalized): Secondary | ICD-10-CM | POA: Diagnosis not present

## 2017-07-21 DIAGNOSIS — C3411 Malignant neoplasm of upper lobe, right bronchus or lung: Secondary | ICD-10-CM | POA: Diagnosis not present

## 2017-07-21 DIAGNOSIS — R2689 Other abnormalities of gait and mobility: Secondary | ICD-10-CM | POA: Diagnosis not present

## 2017-07-29 ENCOUNTER — Telehealth: Payer: Self-pay | Admitting: *Deleted

## 2017-07-29 DIAGNOSIS — G40909 Epilepsy, unspecified, not intractable, without status epilepticus: Secondary | ICD-10-CM | POA: Diagnosis not present

## 2017-07-29 DIAGNOSIS — E119 Type 2 diabetes mellitus without complications: Secondary | ICD-10-CM | POA: Diagnosis not present

## 2017-07-29 DIAGNOSIS — M6281 Muscle weakness (generalized): Secondary | ICD-10-CM | POA: Diagnosis not present

## 2017-07-29 DIAGNOSIS — I10 Essential (primary) hypertension: Secondary | ICD-10-CM | POA: Diagnosis not present

## 2017-07-29 DIAGNOSIS — C7931 Secondary malignant neoplasm of brain: Secondary | ICD-10-CM | POA: Diagnosis not present

## 2017-07-29 DIAGNOSIS — R2689 Other abnormalities of gait and mobility: Secondary | ICD-10-CM | POA: Diagnosis not present

## 2017-07-29 DIAGNOSIS — Z87891 Personal history of nicotine dependence: Secondary | ICD-10-CM | POA: Diagnosis not present

## 2017-07-29 DIAGNOSIS — C3411 Malignant neoplasm of upper lobe, right bronchus or lung: Secondary | ICD-10-CM | POA: Diagnosis not present

## 2017-07-29 NOTE — Telephone Encounter (Addendum)
[facility], this message was copied for a staff message     [phone] Suezanne Jacquet (Physical Therapist) Tammie Buchanan  Needs a Verbal for her to have PT 2x wk/ x 6wks

## 2017-07-29 NOTE — Telephone Encounter (Signed)
Just received a cal from Will Margaretmary Lombard OT with [facility] requesting orders for OT 2 times week times 4 weeks. Please advise

## 2017-07-30 DIAGNOSIS — G40909 Epilepsy, unspecified, not intractable, without status epilepticus: Secondary | ICD-10-CM | POA: Diagnosis not present

## 2017-07-30 DIAGNOSIS — C3411 Malignant neoplasm of upper lobe, right bronchus or lung: Secondary | ICD-10-CM | POA: Diagnosis not present

## 2017-07-30 DIAGNOSIS — R2689 Other abnormalities of gait and mobility: Secondary | ICD-10-CM | POA: Diagnosis not present

## 2017-07-30 DIAGNOSIS — I10 Essential (primary) hypertension: Secondary | ICD-10-CM | POA: Diagnosis not present

## 2017-07-30 DIAGNOSIS — M6281 Muscle weakness (generalized): Secondary | ICD-10-CM | POA: Diagnosis not present

## 2017-07-30 DIAGNOSIS — Z87891 Personal history of nicotine dependence: Secondary | ICD-10-CM | POA: Diagnosis not present

## 2017-07-30 DIAGNOSIS — C7931 Secondary malignant neoplasm of brain: Secondary | ICD-10-CM | POA: Diagnosis not present

## 2017-07-30 DIAGNOSIS — E119 Type 2 diabetes mellitus without complications: Secondary | ICD-10-CM | POA: Diagnosis not present

## 2017-07-30 NOTE — Telephone Encounter (Signed)
Per Dr. Grayland Ormond, ok to give verbal order for any home health services that patient needs at this time. I have spoken with [facility] regarding verbal order for PT.

## 2017-08-03 DIAGNOSIS — M6281 Muscle weakness (generalized): Secondary | ICD-10-CM | POA: Diagnosis not present

## 2017-08-03 DIAGNOSIS — I10 Essential (primary) hypertension: Secondary | ICD-10-CM | POA: Diagnosis not present

## 2017-08-03 DIAGNOSIS — C3411 Malignant neoplasm of upper lobe, right bronchus or lung: Secondary | ICD-10-CM | POA: Diagnosis not present

## 2017-08-03 DIAGNOSIS — R2689 Other abnormalities of gait and mobility: Secondary | ICD-10-CM | POA: Diagnosis not present

## 2017-08-03 DIAGNOSIS — E119 Type 2 diabetes mellitus without complications: Secondary | ICD-10-CM | POA: Diagnosis not present

## 2017-08-03 DIAGNOSIS — Z87891 Personal history of nicotine dependence: Secondary | ICD-10-CM | POA: Diagnosis not present

## 2017-08-03 DIAGNOSIS — G40909 Epilepsy, unspecified, not intractable, without status epilepticus: Secondary | ICD-10-CM | POA: Diagnosis not present

## 2017-08-03 DIAGNOSIS — C7931 Secondary malignant neoplasm of brain: Secondary | ICD-10-CM | POA: Diagnosis not present

## 2017-08-04 ENCOUNTER — Telehealth: Payer: Self-pay | Admitting: *Deleted

## 2017-08-04 NOTE — Telephone Encounter (Signed)
Call received to inquire if patient can have the flu and pneumonia vaccines at the same time with her weakened immune system. Please advise

## 2017-08-04 NOTE — Telephone Encounter (Signed)
That would be fine 

## 2017-08-04 NOTE — Telephone Encounter (Signed)
Shelia notified

## 2017-08-05 DIAGNOSIS — Z87891 Personal history of nicotine dependence: Secondary | ICD-10-CM | POA: Diagnosis not present

## 2017-08-05 DIAGNOSIS — G40909 Epilepsy, unspecified, not intractable, without status epilepticus: Secondary | ICD-10-CM | POA: Diagnosis not present

## 2017-08-05 DIAGNOSIS — I10 Essential (primary) hypertension: Secondary | ICD-10-CM | POA: Diagnosis not present

## 2017-08-05 DIAGNOSIS — C3411 Malignant neoplasm of upper lobe, right bronchus or lung: Secondary | ICD-10-CM | POA: Diagnosis not present

## 2017-08-05 DIAGNOSIS — E119 Type 2 diabetes mellitus without complications: Secondary | ICD-10-CM | POA: Diagnosis not present

## 2017-08-05 DIAGNOSIS — M6281 Muscle weakness (generalized): Secondary | ICD-10-CM | POA: Diagnosis not present

## 2017-08-05 DIAGNOSIS — C7931 Secondary malignant neoplasm of brain: Secondary | ICD-10-CM | POA: Diagnosis not present

## 2017-08-05 DIAGNOSIS — R2689 Other abnormalities of gait and mobility: Secondary | ICD-10-CM | POA: Diagnosis not present

## 2017-08-06 DIAGNOSIS — C3411 Malignant neoplasm of upper lobe, right bronchus or lung: Secondary | ICD-10-CM | POA: Diagnosis not present

## 2017-08-06 DIAGNOSIS — M6281 Muscle weakness (generalized): Secondary | ICD-10-CM | POA: Diagnosis not present

## 2017-08-06 DIAGNOSIS — Z87891 Personal history of nicotine dependence: Secondary | ICD-10-CM | POA: Diagnosis not present

## 2017-08-06 DIAGNOSIS — G40909 Epilepsy, unspecified, not intractable, without status epilepticus: Secondary | ICD-10-CM | POA: Diagnosis not present

## 2017-08-06 DIAGNOSIS — C7931 Secondary malignant neoplasm of brain: Secondary | ICD-10-CM | POA: Diagnosis not present

## 2017-08-06 DIAGNOSIS — R2689 Other abnormalities of gait and mobility: Secondary | ICD-10-CM | POA: Diagnosis not present

## 2017-08-06 DIAGNOSIS — I10 Essential (primary) hypertension: Secondary | ICD-10-CM | POA: Diagnosis not present

## 2017-08-06 DIAGNOSIS — E119 Type 2 diabetes mellitus without complications: Secondary | ICD-10-CM | POA: Diagnosis not present

## 2017-08-10 DIAGNOSIS — G40909 Epilepsy, unspecified, not intractable, without status epilepticus: Secondary | ICD-10-CM | POA: Diagnosis not present

## 2017-08-10 DIAGNOSIS — E119 Type 2 diabetes mellitus without complications: Secondary | ICD-10-CM | POA: Diagnosis not present

## 2017-08-10 DIAGNOSIS — M6281 Muscle weakness (generalized): Secondary | ICD-10-CM | POA: Diagnosis not present

## 2017-08-10 DIAGNOSIS — C3411 Malignant neoplasm of upper lobe, right bronchus or lung: Secondary | ICD-10-CM | POA: Diagnosis not present

## 2017-08-10 DIAGNOSIS — Z87891 Personal history of nicotine dependence: Secondary | ICD-10-CM | POA: Diagnosis not present

## 2017-08-10 DIAGNOSIS — C7931 Secondary malignant neoplasm of brain: Secondary | ICD-10-CM | POA: Diagnosis not present

## 2017-08-10 DIAGNOSIS — R2689 Other abnormalities of gait and mobility: Secondary | ICD-10-CM | POA: Diagnosis not present

## 2017-08-10 DIAGNOSIS — I10 Essential (primary) hypertension: Secondary | ICD-10-CM | POA: Diagnosis not present

## 2017-08-13 DIAGNOSIS — M6281 Muscle weakness (generalized): Secondary | ICD-10-CM | POA: Diagnosis not present

## 2017-08-13 DIAGNOSIS — C3411 Malignant neoplasm of upper lobe, right bronchus or lung: Secondary | ICD-10-CM | POA: Diagnosis not present

## 2017-08-13 DIAGNOSIS — C7931 Secondary malignant neoplasm of brain: Secondary | ICD-10-CM | POA: Diagnosis not present

## 2017-08-13 DIAGNOSIS — I10 Essential (primary) hypertension: Secondary | ICD-10-CM | POA: Diagnosis not present

## 2017-08-13 DIAGNOSIS — G40909 Epilepsy, unspecified, not intractable, without status epilepticus: Secondary | ICD-10-CM | POA: Diagnosis not present

## 2017-08-13 DIAGNOSIS — Z87891 Personal history of nicotine dependence: Secondary | ICD-10-CM | POA: Diagnosis not present

## 2017-08-13 DIAGNOSIS — R2689 Other abnormalities of gait and mobility: Secondary | ICD-10-CM | POA: Diagnosis not present

## 2017-08-13 DIAGNOSIS — E119 Type 2 diabetes mellitus without complications: Secondary | ICD-10-CM | POA: Diagnosis not present

## 2017-08-14 DIAGNOSIS — C7931 Secondary malignant neoplasm of brain: Secondary | ICD-10-CM | POA: Diagnosis not present

## 2017-08-14 DIAGNOSIS — G40909 Epilepsy, unspecified, not intractable, without status epilepticus: Secondary | ICD-10-CM | POA: Diagnosis not present

## 2017-08-14 DIAGNOSIS — R2689 Other abnormalities of gait and mobility: Secondary | ICD-10-CM | POA: Diagnosis not present

## 2017-08-14 DIAGNOSIS — I10 Essential (primary) hypertension: Secondary | ICD-10-CM | POA: Diagnosis not present

## 2017-08-14 DIAGNOSIS — E119 Type 2 diabetes mellitus without complications: Secondary | ICD-10-CM | POA: Diagnosis not present

## 2017-08-14 DIAGNOSIS — C3411 Malignant neoplasm of upper lobe, right bronchus or lung: Secondary | ICD-10-CM | POA: Diagnosis not present

## 2017-08-14 DIAGNOSIS — M6281 Muscle weakness (generalized): Secondary | ICD-10-CM | POA: Diagnosis not present

## 2017-08-14 DIAGNOSIS — Z87891 Personal history of nicotine dependence: Secondary | ICD-10-CM | POA: Diagnosis not present

## 2017-08-17 ENCOUNTER — Telehealth: Payer: Self-pay | Admitting: *Deleted

## 2017-08-17 ENCOUNTER — Emergency Department: Payer: Medicare HMO

## 2017-08-17 ENCOUNTER — Inpatient Hospital Stay
Admission: EM | Admit: 2017-08-17 | Discharge: 2017-08-21 | DRG: 637 | Disposition: A | Discharge status: Skilled Nursing Facility | Payer: Medicare HMO | Attending: Internal Medicine | Admitting: Internal Medicine

## 2017-08-17 ENCOUNTER — Encounter: Payer: Self-pay | Admitting: Emergency Medicine

## 2017-08-17 DIAGNOSIS — Z23 Encounter for immunization: Secondary | ICD-10-CM

## 2017-08-17 DIAGNOSIS — Z885 Allergy status to narcotic agent status: Secondary | ICD-10-CM

## 2017-08-17 DIAGNOSIS — Z85118 Personal history of other malignant neoplasm of bronchus and lung: Secondary | ICD-10-CM | POA: Diagnosis not present

## 2017-08-17 DIAGNOSIS — Z881 Allergy status to other antibiotic agents status: Secondary | ICD-10-CM | POA: Diagnosis not present

## 2017-08-17 DIAGNOSIS — I6523 Occlusion and stenosis of bilateral carotid arteries: Secondary | ICD-10-CM | POA: Diagnosis not present

## 2017-08-17 DIAGNOSIS — I63511 Cerebral infarction due to unspecified occlusion or stenosis of right middle cerebral artery: Secondary | ICD-10-CM | POA: Diagnosis not present

## 2017-08-17 DIAGNOSIS — R569 Unspecified convulsions: Secondary | ICD-10-CM | POA: Diagnosis present

## 2017-08-17 DIAGNOSIS — Z8041 Family history of malignant neoplasm of ovary: Secondary | ICD-10-CM

## 2017-08-17 DIAGNOSIS — G40909 Epilepsy, unspecified, not intractable, without status epilepticus: Secondary | ICD-10-CM | POA: Diagnosis not present

## 2017-08-17 DIAGNOSIS — R4182 Altered mental status, unspecified: Secondary | ICD-10-CM | POA: Diagnosis not present

## 2017-08-17 DIAGNOSIS — R739 Hyperglycemia, unspecified: Secondary | ICD-10-CM | POA: Diagnosis not present

## 2017-08-17 DIAGNOSIS — Z794 Long term (current) use of insulin: Secondary | ICD-10-CM | POA: Diagnosis not present

## 2017-08-17 DIAGNOSIS — Z833 Family history of diabetes mellitus: Secondary | ICD-10-CM | POA: Diagnosis not present

## 2017-08-17 DIAGNOSIS — E1165 Type 2 diabetes mellitus with hyperglycemia: Principal | ICD-10-CM | POA: Diagnosis present

## 2017-08-17 DIAGNOSIS — R4781 Slurred speech: Secondary | ICD-10-CM | POA: Diagnosis not present

## 2017-08-17 DIAGNOSIS — I10 Essential (primary) hypertension: Secondary | ICD-10-CM | POA: Diagnosis present

## 2017-08-17 DIAGNOSIS — Z7983 Long term (current) use of bisphosphonates: Secondary | ICD-10-CM | POA: Diagnosis not present

## 2017-08-17 DIAGNOSIS — K219 Gastro-esophageal reflux disease without esophagitis: Secondary | ICD-10-CM | POA: Diagnosis not present

## 2017-08-17 DIAGNOSIS — Z8673 Personal history of transient ischemic attack (TIA), and cerebral infarction without residual deficits: Secondary | ICD-10-CM | POA: Diagnosis not present

## 2017-08-17 DIAGNOSIS — E11649 Type 2 diabetes mellitus with hypoglycemia without coma: Secondary | ICD-10-CM | POA: Diagnosis present

## 2017-08-17 DIAGNOSIS — Z7982 Long term (current) use of aspirin: Secondary | ICD-10-CM | POA: Diagnosis not present

## 2017-08-17 DIAGNOSIS — Z87891 Personal history of nicotine dependence: Secondary | ICD-10-CM

## 2017-08-17 DIAGNOSIS — I639 Cerebral infarction, unspecified: Secondary | ICD-10-CM | POA: Diagnosis not present

## 2017-08-17 DIAGNOSIS — M810 Age-related osteoporosis without current pathological fracture: Secondary | ICD-10-CM | POA: Diagnosis present

## 2017-08-17 DIAGNOSIS — E785 Hyperlipidemia, unspecified: Secondary | ICD-10-CM | POA: Diagnosis not present

## 2017-08-17 DIAGNOSIS — Z888 Allergy status to other drugs, medicaments and biological substances status: Secondary | ICD-10-CM | POA: Diagnosis not present

## 2017-08-17 DIAGNOSIS — C7931 Secondary malignant neoplasm of brain: Secondary | ICD-10-CM | POA: Diagnosis present

## 2017-08-17 DIAGNOSIS — D472 Monoclonal gammopathy: Secondary | ICD-10-CM | POA: Diagnosis present

## 2017-08-17 DIAGNOSIS — C3411 Malignant neoplasm of upper lobe, right bronchus or lung: Secondary | ICD-10-CM | POA: Diagnosis not present

## 2017-08-17 DIAGNOSIS — I69398 Other sequelae of cerebral infarction: Secondary | ICD-10-CM | POA: Diagnosis not present

## 2017-08-17 DIAGNOSIS — G4700 Insomnia, unspecified: Secondary | ICD-10-CM | POA: Diagnosis not present

## 2017-08-17 DIAGNOSIS — R410 Disorientation, unspecified: Secondary | ICD-10-CM | POA: Diagnosis not present

## 2017-08-17 DIAGNOSIS — Z66 Do not resuscitate: Secondary | ICD-10-CM | POA: Diagnosis present

## 2017-08-17 DIAGNOSIS — Z923 Personal history of irradiation: Secondary | ICD-10-CM

## 2017-08-17 DIAGNOSIS — Z9849 Cataract extraction status, unspecified eye: Secondary | ICD-10-CM | POA: Diagnosis not present

## 2017-08-17 DIAGNOSIS — R29705 NIHSS score 5: Secondary | ICD-10-CM | POA: Diagnosis present

## 2017-08-17 DIAGNOSIS — J9811 Atelectasis: Secondary | ICD-10-CM | POA: Diagnosis not present

## 2017-08-17 DIAGNOSIS — E119 Type 2 diabetes mellitus without complications: Secondary | ICD-10-CM | POA: Diagnosis not present

## 2017-08-17 DIAGNOSIS — R2689 Other abnormalities of gait and mobility: Secondary | ICD-10-CM | POA: Diagnosis not present

## 2017-08-17 DIAGNOSIS — Z90710 Acquired absence of both cervix and uterus: Secondary | ICD-10-CM

## 2017-08-17 DIAGNOSIS — C3490 Malignant neoplasm of unspecified part of unspecified bronchus or lung: Secondary | ICD-10-CM | POA: Diagnosis not present

## 2017-08-17 DIAGNOSIS — Z808 Family history of malignant neoplasm of other organs or systems: Secondary | ICD-10-CM

## 2017-08-17 DIAGNOSIS — M6281 Muscle weakness (generalized): Secondary | ICD-10-CM | POA: Diagnosis not present

## 2017-08-17 LAB — GLUCOSE, CAPILLARY
GLUCOSE-CAPILLARY: 358 mg/dL — AB (ref 65–99)
Glucose-Capillary: >600 mg/dL (ref 65–99)
Glucose-Capillary: 425 mg/dL — ABNORMAL HIGH (ref 65–99)
Glucose-Capillary: 350 mg/dL — ABNORMAL HIGH (ref 65–99)
Glucose-Capillary: 339 mg/dL — ABNORMAL HIGH (ref 65–99)

## 2017-08-17 LAB — DIFFERENTIAL
BASOS PCT: 1 %
Basophils Absolute: 0 10*3/uL (ref 0–0.1)
EOS ABS: 0.1 10*3/uL (ref 0–0.7)
EOS PCT: 1 %
LYMPHS PCT: 14 %
Lymphs Abs: 1 10*3/uL (ref 1.0–3.6)
Monocytes Absolute: 0.8 10*3/uL (ref 0.2–0.9)
Monocytes Relative: 11 %
NEUTROS PCT: 73 %
Neutro Abs: 5.4 10*3/uL (ref 1.4–6.5)

## 2017-08-17 LAB — BLOOD GAS, VENOUS
ACID-BASE EXCESS: 4.8 mmol/L — AB (ref 0.0–2.0)
Bicarbonate: 30.9 mmol/L — ABNORMAL HIGH (ref 20.0–28.0)
O2 Saturation: 60.9 %
PCO2 VEN: 51 mmHg (ref 44.0–60.0)
PH VEN: 7.39 (ref 7.250–7.430)
Patient temperature: 37
pO2, Ven: 32 mmHg (ref 32.0–45.0)

## 2017-08-17 LAB — COMPREHENSIVE METABOLIC PANEL
ALBUMIN: 2.4 g/dL — AB (ref 3.5–5.0)
ALK PHOS: 115 U/L (ref 38–126)
ALT: 17 U/L (ref 14–54)
ANION GAP: 12 (ref 5–15)
AST: 15 U/L (ref 15–41)
BUN: 13 mg/dL (ref 6–20)
CO2: 27 mmol/L (ref 22–32)
Calcium: 8 mg/dL — ABNORMAL LOW (ref 8.9–10.3)
Chloride: 88 mmol/L — ABNORMAL LOW (ref 101–111)
Creatinine, Ser: 1.08 mg/dL — ABNORMAL HIGH (ref 0.44–1.00)
GFR calc Af Amer: 55 mL/min — ABNORMAL LOW (ref >60)
GFR calc non Af Amer: 48 mL/min — ABNORMAL LOW (ref >60)
GLUCOSE: 721 mg/dL — AB (ref 65–99)
POTASSIUM: 4 mmol/L (ref 3.5–5.1)
SODIUM: 127 mmol/L — AB (ref 135–145)
Total Bilirubin: 0.8 mg/dL (ref 0.3–1.2)
Total Protein: 5.2 g/dL — ABNORMAL LOW (ref 6.5–8.1)

## 2017-08-17 LAB — URINALYSIS, ROUTINE W REFLEX MICROSCOPIC
BACTERIA UA: NONE SEEN
BILIRUBIN URINE: NEGATIVE
Glucose, UA: >=500 mg/dL — AB
HGB URINE DIPSTICK: NEGATIVE
KETONES UR: NEGATIVE mg/dL
LEUKOCYTES UA: NEGATIVE
NITRITE: NEGATIVE
Protein, ur: NEGATIVE mg/dL
Specific Gravity, Urine: 1.017 (ref 1.005–1.030)
WBC UA: NONE SEEN WBC/hpf (ref 0–5)
pH: 6 (ref 5.0–8.0)

## 2017-08-17 LAB — CBC
HEMATOCRIT: 38.3 % (ref 35.0–47.0)
HEMOGLOBIN: 12.5 g/dL (ref 12.0–16.0)
MCH: 29.4 pg (ref 26.0–34.0)
MCHC: 32.7 g/dL (ref 32.0–36.0)
MCV: 89.9 fL (ref 80.0–100.0)
Platelets: 187 10*3/uL (ref 150–440)
RBC: 4.25 MIL/uL (ref 3.80–5.20)
RDW: 16.5 % — AB (ref 11.5–14.5)
WBC: 7.3 10*3/uL (ref 3.6–11.0)

## 2017-08-17 LAB — PROTIME-INR
INR: 1
Prothrombin Time: 13.1 seconds (ref 11.4–15.2)

## 2017-08-17 LAB — APTT: APTT: 83 s — AB (ref 24–36)

## 2017-08-17 LAB — TROPONIN I: TROPONIN I: 0.03 ng/mL — AB (ref <0.03)

## 2017-08-17 MED ORDER — COLCHICINE-PROBENECID 0.5-500 MG PO TABS
1.0000 | ORAL_TABLET | Freq: Two times a day (BID) | ORAL | Status: DC
  Filled 2017-08-17: qty 1

## 2017-08-17 MED ORDER — SODIUM CHLORIDE 0.9 % IV SOLN
INTRAVENOUS | Status: DC
  Filled 2017-08-17: qty 1

## 2017-08-17 MED ORDER — COLCHICINE 0.6 MG PO TABS
0.6000 mg | ORAL_TABLET | Freq: Two times a day (BID) | ORAL | Status: DC
  Administered 2017-08-17 – 2017-08-21 (×8): 0.6 mg via ORAL
  Filled 2017-08-17 (×9): qty 1

## 2017-08-17 MED ORDER — SIMVASTATIN 20 MG PO TABS
20.0000 mg | ORAL_TABLET | Freq: Every evening | ORAL | Status: DC
  Administered 2017-08-17: 20 mg via ORAL
  Filled 2017-08-17: qty 1

## 2017-08-17 MED ORDER — DEXAMETHASONE 4 MG PO TABS
4.0000 mg | ORAL_TABLET | Freq: Every day | ORAL | Status: DC
  Filled 2017-08-17: qty 1

## 2017-08-17 MED ORDER — PANTOPRAZOLE SODIUM 40 MG PO TBEC
40.0000 mg | DELAYED_RELEASE_TABLET | Freq: Every day | ORAL | Status: DC
  Administered 2017-08-18 – 2017-08-21 (×4): 40 mg via ORAL
  Filled 2017-08-17 (×4): qty 1

## 2017-08-17 MED ORDER — DOCUSATE SODIUM 100 MG PO CAPS
200.0000 mg | ORAL_CAPSULE | Freq: Every day | ORAL | Status: DC
  Administered 2017-08-17 – 2017-08-20 (×4): 200 mg via ORAL
  Filled 2017-08-17 (×4): qty 2

## 2017-08-17 MED ORDER — ALENDRONATE SODIUM 70 MG PO TABS: 70.0000 mg | ORAL_TABLET | ORAL | Status: DC

## 2017-08-17 MED ORDER — CALCIUM CARBONATE-VITAMIN D 500-200 MG-UNIT PO TABS
1.0000 | ORAL_TABLET | Freq: Every day | ORAL | Status: DC
  Administered 2017-08-19 – 2017-08-21 (×3): 1 via ORAL
  Filled 2017-08-17 (×4): qty 1

## 2017-08-17 MED ORDER — PROCHLORPERAZINE MALEATE 10 MG PO TABS
10.0000 mg | ORAL_TABLET | Freq: Four times a day (QID) | ORAL | Status: DC | PRN
  Filled 2017-08-17: qty 1

## 2017-08-17 MED ORDER — DOCUSATE SODIUM 100 MG PO CAPS
100.0000 mg | ORAL_CAPSULE | Freq: Two times a day (BID) | ORAL | Status: DC | PRN
  Administered 2017-08-19: 100 mg via ORAL
  Filled 2017-08-17: qty 1

## 2017-08-17 MED ORDER — SODIUM CHLORIDE 0.9 % IV BOLUS (SEPSIS)
1000.0000 mL | Freq: Once | INTRAVENOUS | Status: AC
  Administered 2017-08-17: 1000 mL via INTRAVENOUS

## 2017-08-17 MED ORDER — INSULIN ASPART 100 UNIT/ML ~~LOC~~ SOLN
0.0000 [IU] | Freq: Three times a day (TID) | SUBCUTANEOUS | Status: DC
  Administered 2017-08-17: 7 [IU] via SUBCUTANEOUS
  Administered 2017-08-18: 08:00:00 3 [IU] via SUBCUTANEOUS
  Administered 2017-08-18 – 2017-08-19 (×2): 2 [IU] via SUBCUTANEOUS
  Administered 2017-08-19: 5 [IU] via SUBCUTANEOUS
  Administered 2017-08-19: 3 [IU] via SUBCUTANEOUS
  Administered 2017-08-20: 13:00:00 5 [IU] via SUBCUTANEOUS
  Administered 2017-08-20: 2 [IU] via SUBCUTANEOUS
  Administered 2017-08-20: 3 [IU] via SUBCUTANEOUS
  Administered 2017-08-21: 7 [IU] via SUBCUTANEOUS
  Administered 2017-08-21: 08:00:00 2 [IU] via SUBCUTANEOUS
  Filled 2017-08-17 (×12): qty 1

## 2017-08-17 MED ORDER — LEVETIRACETAM 500 MG PO TABS
500.0000 mg | ORAL_TABLET | Freq: Two times a day (BID) | ORAL | Status: DC
  Administered 2017-08-17 – 2017-08-21 (×8): 500 mg via ORAL
  Filled 2017-08-17 (×9): qty 1

## 2017-08-17 MED ORDER — AMLODIPINE BESYLATE 10 MG PO TABS
10.0000 mg | ORAL_TABLET | Freq: Every day | ORAL | Status: DC
  Administered 2017-08-17 – 2017-08-20 (×4): 10 mg via ORAL
  Filled 2017-08-17 (×4): qty 1

## 2017-08-17 MED ORDER — SODIUM CHLORIDE 0.9 % IV SOLN
INTRAVENOUS | Status: DC
  Administered 2017-08-17 – 2017-08-18 (×2): via INTRAVENOUS

## 2017-08-17 MED ORDER — MELATONIN 5 MG PO TABS
5.0000 mg | ORAL_TABLET | Freq: Every day | ORAL | Status: DC
  Administered 2017-08-17 – 2017-08-20 (×4): 5 mg via ORAL
  Filled 2017-08-17 (×5): qty 1

## 2017-08-17 MED ORDER — GABAPENTIN 300 MG PO CAPS
300.0000 mg | ORAL_CAPSULE | Freq: Three times a day (TID) | ORAL | Status: DC
  Administered 2017-08-17 – 2017-08-21 (×12): 300 mg via ORAL
  Filled 2017-08-17 (×12): qty 1

## 2017-08-17 MED ORDER — ASPIRIN EC 81 MG PO TBEC
81.0000 mg | DELAYED_RELEASE_TABLET | Freq: Every day | ORAL | Status: DC
  Administered 2017-08-18 – 2017-08-21 (×4): 81 mg via ORAL
  Filled 2017-08-17 (×4): qty 1

## 2017-08-17 MED ORDER — INSULIN GLARGINE 100 UNIT/ML ~~LOC~~ SOLN
10.0000 [IU] | Freq: Every day | SUBCUTANEOUS | Status: DC
  Administered 2017-08-18: 10 [IU] via SUBCUTANEOUS
  Filled 2017-08-17 (×3): qty 0.1

## 2017-08-17 MED ORDER — INSULIN ASPART 100 UNIT/ML ~~LOC~~ SOLN: 0.0000 [IU] | Freq: Three times a day (TID) | SUBCUTANEOUS | Status: DC

## 2017-08-17 MED ORDER — INSULIN ASPART 100 UNIT/ML ~~LOC~~ SOLN
8.0000 [IU] | Freq: Once | SUBCUTANEOUS | Status: AC
  Administered 2017-08-17: 8 [IU] via INTRAVENOUS
  Filled 2017-08-17: qty 1

## 2017-08-17 MED ORDER — ACETAMINOPHEN 500 MG PO TABS
500.0000 mg | ORAL_TABLET | Freq: Two times a day (BID) | ORAL | Status: DC | PRN
  Administered 2017-08-19: 08:00:00 500 mg via ORAL
  Filled 2017-08-17: qty 1

## 2017-08-17 MED ORDER — CLONIDINE HCL 0.3 MG/24HR TD PTWK
0.3000 mg | MEDICATED_PATCH | TRANSDERMAL | Status: DC
  Filled 2017-08-17: qty 1

## 2017-08-17 MED ORDER — CARVEDILOL 25 MG PO TABS
50.0000 mg | ORAL_TABLET | Freq: Two times a day (BID) | ORAL | Status: DC
  Administered 2017-08-17 – 2017-08-21 (×3): 50 mg via ORAL
  Filled 2017-08-17 (×7): qty 2

## 2017-08-17 MED ORDER — HEPARIN SODIUM (PORCINE) 5000 UNIT/ML IJ SOLN
5000.0000 [IU] | Freq: Three times a day (TID) | INTRAMUSCULAR | Status: DC
  Administered 2017-08-17 – 2017-08-21 (×11): 5000 [IU] via SUBCUTANEOUS
  Filled 2017-08-17 (×11): qty 1

## 2017-08-17 MED ORDER — INSULIN ASPART 100 UNIT/ML ~~LOC~~ SOLN
5.0000 [IU] | Freq: Three times a day (TID) | SUBCUTANEOUS | Status: DC
  Administered 2017-08-18 – 2017-08-20 (×3): 5 [IU] via SUBCUTANEOUS
  Filled 2017-08-17 (×3): qty 1

## 2017-08-17 MED ORDER — ONDANSETRON HCL 4 MG PO TABS: 8.0000 mg | ORAL_TABLET | Freq: Two times a day (BID) | ORAL | Status: DC | PRN

## 2017-08-17 MED ORDER — PROBENECID 500 MG PO TABS
500.0000 mg | ORAL_TABLET | Freq: Two times a day (BID) | ORAL | Status: DC
  Administered 2017-08-17 – 2017-08-21 (×8): 500 mg via ORAL
  Filled 2017-08-17 (×9): qty 1

## 2017-08-17 MED ORDER — SODIUM CHLORIDE 0.9 % IV SOLN: Freq: Once | INTRAVENOUS | Status: DC

## 2017-08-17 NOTE — ED Notes (Signed)
Pt allowing staff to access port in R chest. Pt tolerated well.

## 2017-08-17 NOTE — ED Triage Notes (Signed)
Patient presents to the ED with left sided weakness and increased confusion since Saturday morning.  Patient's daughter reports that patient has been getting chemotherapy and radiation for brain cancer.  Patient's daughter called patient's [place] doctor this morning who instructed them to take patient to the ED.  Patient is answering questions appropriately at this time, appears very tired and lethargic, closes eyes between questions.

## 2017-08-17 NOTE — ED Notes (Addendum)
Pt daughter states pt started having L sided weakness and garbled speech on Saturday. Speech cleared up by night, same again on Sunday. Pt appears very weak on L side. Leaning toward L side. Speech slurred/garbled. Pt does NOT take DM medications. Hx brain CA, has R sided chest port. Has received chemo and radiation, last treatment in July 2018. Pt appears very fatigued. Daughter also states that pt had difficulty eating this AM, would forget to take bites of food and had trouble chewing.

## 2017-08-17 NOTE — ED Provider Notes (Signed)
[facility] Emergency Department Provider Note  Time seen: 1:24 PM  I have reviewed the triage vital signs and the nursing notes.   HISTORY  Chief Complaint Altered Mental Status    HPI Tammie GRACEE Buchanan is a 76 y.o. 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  Presents to the emergency department for altered mental status.  According to the daughter over the past 3-4 days the patient has been intermittently confused.  Daughter states at times she appears to be lucid and coherent.  Other times the patient does not even recognize her and appears to be confused.  They have also noted the patient's blood glucose has been elevated.  Patient is currently taking Decadron but this has been a daily medication since August.  Patient denies any complaints at this time.  Denies any chest pain, headache.  Patient does have a history of left-sided weakness due to brain cancer.  Past Medical History:  Diagnosis Date  . Brain cancer ([place])   . Diabetes mellitus without complication ([place])   . Hypertension   . Lung cancer [facility])     Patient Active Problem List   Diagnosis Date Noted  . Encounter for antineoplastic chemotherapy 05/24/2017  . Seizure ([place]) 04/12/2017  . 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 Brain metastasis ([place]) 01/07/2017  . Lung metastases ([place]) 01/03/2017  . Right frontal lobe mass 01/02/2017  . 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 DNR (do not resuscitate) 04/14/2016  . Tobacco abuse 12/12/2015  . Pain in right eye 04/24/2015  . GERD (gastroesophageal reflux disease) 08/15/2013  . Hyperlipidemia 08/15/2013  . Diabetes mellitus, type II ([place]) 03/09/2013  . Gout 03/03/2013  . 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 Zoster 02/16/2013  . Chronic kidney disease, stage III (moderate) ([place]) 10/03/2012   . Degenerative drusen 08/30/2012  . Status post cataract extraction 08/26/2011  . Transient cerebral ischemia 02/19/2010  . Osteoporosis 09/25/2009  . Type II diabetes mellitus ([place]) 11/24/2008  . Essential hypertension 01/27/1997    Past Surgical History:  Procedure Laterality Date  . ABDOMINAL HYSTERECTOMY     partial  . APPENDECTOMY    . PORTA CATH INSERTION N/A 02/04/2017   Procedure: [name];  Surgeon: Algernon Huxley, MD;  Location: [place] [facility];  Service: Cardiovascular;  Laterality: N/A;    Prior to Admission medications   Medication Sig Start Date End Date Taking? Authorizing Provider  acetaminophen (TYLENOL) 500 MG tablet Take 500 mg by mouth 2 (two) times daily as needed for moderate pain.    [provider]  alendronate (FOSAMAX) 70 MG tablet Take 70 mg by mouth every Friday.  07/10/16   [provider]  amLODipine (NORVASC) 10 MG tablet Take 10 mg by mouth at bedtime.  01/21/17 01/21/18  [provider]  amoxicillin-clavulanate (AUGMENTIN) 875-125 MG tablet Take 1 tablet by mouth 2 (two) times daily. For 7 days. 05/29/17   Sindy Guadeloupe, MD  aspirin EC 81 MG tablet Take 81 mg by mouth daily.  09/20/14   [provider]  Calcium Carb-Cholecalciferol 600-800 MG-UNIT TABS Take 1 tablet by mouth daily.  07/10/16   [provider]  carvedilol (COREG) 25 MG tablet Take 50 mg by mouth 2 (two) times daily with a meal.  01/20/17 03/31/19  [provider]  cetirizine (ZYRTEC) 10 MG tablet Take 10  mg by mouth at bedtime.     [provider]  cloNIDine (CATAPRES - DOSED IN MG/24 HR) 0.3 mg/24hr patch Place 0.3 mg onto the skin once a week. Change patch on Wednesdays 01/21/17 03/30/37  [provider]  colchicine-probenecid 0.5-500 MG tablet Take 1 tablet by mouth 2 (two) times daily.  07/10/16 07/10/17  [provider]  dexamethasone (DECADRON) 4 MG tablet Take 1 tablet (4 mg total) by mouth daily.  07/01/17   Lloyd Huger, MD  docusate sodium (COLACE) 100 MG capsule Take 200-400 mg by mouth at bedtime.     [provider]  erythromycin ophthalmic ointment Apply to eyelid margins every night 07/13/15   [provider]  esomeprazole (NEXIUM) 40 MG capsule Take 40 mg by mouth daily.  07/10/16 07/10/17  [provider]  gabapentin (NEURONTIN) 300 MG capsule Take 300 mg by mouth 3 (three) times daily.  01/20/17 03/30/37  [provider]  Hypromellose (ARTIFICIAL TEARS OP) Apply 1 drop to eye daily as needed (dry eyes).    [provider]  levETIRAcetam (KEPPRA) 500 MG tablet Take 1 tablet (500 mg total) by mouth 2 (two) times daily. 07/01/17   Lloyd Huger, MD  Melatonin 10 MG TABS Take 20 mg by mouth at bedtime.    [provider]  ondansetron (ZOFRAN) 8 MG tablet Take 1 tablet (8 mg total) by mouth 2 (two) times daily as needed for refractory nausea / vomiting. 01/31/17   Lloyd Huger, MD  prochlorperazine (COMPAZINE) 10 MG tablet Take 1 tablet (10 mg total) by mouth every 6 (six) hours as needed (Nausea or vomiting). 01/31/17   Lloyd Huger, MD  simvastatin (ZOCOR) 20 MG tablet Take 20 mg by mouth every evening.  12/05/16   [provider]    Allergies  Allergen Reactions  . Ace Inhibitors Anaphylaxis  . Allopurinol Anaphylaxis  . Angiotensin Receptor Blockers Anaphylaxis  . Codeine Anaphylaxis  . Enalapril Swelling    Other reaction(s): SWELLING/EDEMA  . Furosemide Swelling    Other reaction(s): OTHER  . Hydralazine Swelling    Other reaction(s): SWELLING/EDEMA  . Hydrochlorothiazide Anaphylaxis  . Irbesartan Swelling    Other reaction(s): SWELLING/EDEMA  . Levofloxacin Swelling    Other reaction(s): SWELLING/EDEMA  . Lidocaine Swelling    Other reaction(s): SWELLING/EDEMA  . Naproxen Sodium Anaphylaxis  . Spironolactone Anaphylaxis  . Tramadol Anaphylaxis    Other reaction(s): NAUSEA    Family  History  Problem Relation Age of Onset  . Diabetes Mother   . Melanoma Sister   . Ovarian cancer Sister   . Diabetes Sister   . Diabetes Brother   . Diabetes Maternal Grandmother   . Diabetes Paternal Grandmother     Social History Social History  Substance Use Topics  . Smoking status: Former Research scientist (life sciences)  . Smokeless tobacco: Never Used  . Alcohol use No    Review of Systems Constitutional: Negative for fever. Cardiovascular: Negative for chest pain. Respiratory: Negative for shortness of breath. Gastrointestinal: Negative for abdominal pain, vomiting Genitourinary: Negative for dysuria. All other ROS negative  ____________________________________________   PHYSICAL EXAM:  VITAL SIGNS: ED Triage Vitals  Enc Vitals Group     BP 08/17/17 1154 (!) 125/51     Pulse Rate 08/17/17 1154 76     Resp 08/17/17 1154 16     Temp 08/17/17 1154 98.7 F (37.1 C)     Temp Source 08/17/17 1154 Oral     SpO2 08/17/17  1154 92 %     Weight 08/17/17 1155 144 lb (65.3 kg)     Height 08/17/17 1155 5\' 4"  (1.626 m)     Head Circumference --      Peak Flow --      Pain Score 08/17/17 1153 5     Pain Loc --      Pain Edu? --      Excl. in [place]? --     Constitutional: Alert, well-appearing, no distress at this time. Eyes: Normal exam ENT   Head: Normocephalic and atraumatic.   Mouth/Throat: Mucous membranes are moist. Cardiovascular: Normal rate, regular rhythm. No murmur Respiratory: Normal respiratory effort without tachypnea nor retractions. Breath sounds are clear and equal bilaterally. No wheezes/rales/rhonchi.  Right subclavian port present.  Gastrointestinal: Soft and nontender. No distention.  Musculoskeletal: Nontender with normal range of motion in all extremities.  Neurologic:  Normal speech and language.  Left upper and lower extremity weakness, chronic per daughter. Skin:  Skin is warm, dry and intact.  Psychiatric: Mood and affect are normal.   ____________________________________________    EKG  EKG reviewed and interpreted by myself shows normal sinus rhythm at 74 bpm, narrow QRS, left axis deviation, normal intervals, nonspecific ST changes.  ____________________________________________    RADIOLOGY  IMPRESSION: Posterior right frontal metastatic lesion not visualized as on prior examinations which may represent response to treatment. Contrast-enhanced MR would be best for assessing possibility of residual or progressive metastatic disease.  No intracranial hemorrhage or CT evidence of large acute infarct.  Prominent chronic microvascular changes.  Atrophy without hydrocephalus.  Almost complete opacification right maxillary sinus as previously noted.  X-ray shows no acute abnormality  ____________________________________________   INITIAL IMPRESSION / ASSESSMENT AND PLAN / ED COURSE  Pertinent labs & imaging results that were available during my care of the patient were reviewed by me and considered in my medical decision making (see chart for details).  Patient presents to the emergency department for altered mental status intermittent over the past 4 days.  Differential would include progression of metastatic cancer, electrolyte abnormality, medication reaction, DKA, dehydration, infectious etiology, CVA.  Patient is found to have a glucose greater than 600 by fingerstick.  CT scan appears to show if anything improved metastatic disease.  Labs are pending.  We will IV hydrate, patient has a room air saturation of 91% will obtain a chest x-ray.  Overall the patient appears well currently, is able to answer questions and follow commands.  Daughter states the altered mental status appears to be transient coming and going at times.  Patient is on dexamethasone which could be contributing to her hyperglycemia as well as altered mental status.  Daughter states they are currently tapering down this  medication.  Patient's blood sugar has resulted 720.  Given the patient's significant hyperglycemia not on any diabetic medications now with acute altered mental status I believe the patient would benefit from admission to the hospital.  We will start her on an insulin bolus and infusion, IV fluids and admit for further treatment.  Currently awaiting urinalysis results.  ____________________________________________   FINAL CLINICAL IMPRESSION(S) / ED DIAGNOSES  Altered mental status Hyperglycemia Uncontrolled diabetes   Harvest Dark, MD 08/17/17 [id number]

## 2017-08-17 NOTE — ED Notes (Signed)
Pt in scan. Will access port for blood when pt returns.

## 2017-08-17 NOTE — ED Notes (Addendum)
Patient's IV was not able to draw blood at this time.

## 2017-08-17 NOTE — ED Notes (Signed)
X-ray at bedside

## 2017-08-17 NOTE — ED Notes (Signed)
Patient's daughter states patient's blood sugar has not been checked for the past weekend.

## 2017-08-17 NOTE — Telephone Encounter (Signed)
Daughter called to report that patient has had extreme confusion off and on since Friday night with restlessness, inability to talk and "flighty". Asking if this is something that is expected or if she should take her to the ER. States she has no signs of stroke. Advised to take to ER

## 2017-08-17 NOTE — Progress Notes (Signed)
Patient had multiple skins tears on admission to R elbow, L lower leg, upper left arm left lower arm. Left hand, right lateral upper shin all were cleansed and non adherent foam dressing applied

## 2017-08-17 NOTE — ED Notes (Signed)
Notified EDP of patient's current blood sugar.  EDP to d/c the start of insulin drip at this time.  Pt on bedpan and attempting to provide urine sample.

## 2017-08-17 NOTE — H&P (Addendum)
Tammie Buchanan NAME: Tammie Buchanan    MR#:  [id number]  DATE OF BIRTH:  03/19/1941  DATE OF ADMISSION:  08/17/2017  PRIMARY CARE PHYSICIAN: [name], NP   REQUESTING/REFERRING PHYSICIAN: [email or web address]  CHIEF COMPLAINT:   Chief Complaint  Patient presents with  . Altered Mental Status    HISTORY OF PRESENT ILLNESS: Tammie Buchanan  is a 76 y.o. female with a known history of Lung cancer, mets to brain- Htn- is on decadrone for brain mets. For last few days have increasing confusion and weakness. Daughter called Oncologist, suggested to take to ER. Daughter denies c/o urinary symptoms, cough, fever. Blood sugar was > 800, no DKA. Given IV fluid bolus and Insulin inj, and given for admission, in 1 hr after inj, her blood sugar is 425, when I saw her.  PAST MEDICAL HISTORY:   Past Medical History:  Diagnosis Date  . Brain cancer ([place])   . Diabetes mellitus without complication ([place])   . Hypertension   . Lung cancer ([place])     PAST SURGICAL HISTORY: Past Surgical History:  Procedure Laterality Date  . ABDOMINAL HYSTERECTOMY     partial  . APPENDECTOMY    . PORTA CATH INSERTION N/A 02/04/2017   Procedure: [name];  Surgeon: Algernon Huxley, MD;  Location: [place] [facility];  Service: Cardiovascular;  Laterality: N/A;    SOCIAL HISTORY:  Social History  Substance Use Topics  . Smoking status: Former Research scientist (life sciences)  . Smokeless tobacco: Never Used  . Alcohol use No    FAMILY HISTORY:  Family History  Problem Relation Age of Onset  . Diabetes Mother   . Melanoma Sister   . Ovarian cancer Sister   . Diabetes Sister   . Diabetes Brother   . Diabetes Maternal Grandmother   . Diabetes Paternal Grandmother     DRUG ALLERGIES:  Allergies  Allergen Reactions  . Ace Inhibitors Anaphylaxis  . Allopurinol Anaphylaxis  . Angiotensin Receptor Blockers Anaphylaxis  . Codeine Anaphylaxis  . Enalapril Swelling     Other reaction(s): SWELLING/EDEMA  . Furosemide Swelling    Other reaction(s): OTHER  . Hydralazine Swelling    Other reaction(s): SWELLING/EDEMA  . Hydrochlorothiazide Anaphylaxis  . Irbesartan Swelling    Other reaction(s): SWELLING/EDEMA  . Levofloxacin Swelling    Other reaction(s): SWELLING/EDEMA  . Lidocaine Swelling    Other reaction(s): SWELLING/EDEMA  . Naproxen Sodium Anaphylaxis  . Spironolactone Anaphylaxis  . Tramadol Anaphylaxis    Other reaction(s): NAUSEA    REVIEW OF SYSTEMS:   Because of confusion, pt is not able to give ROS.  MEDICATIONS AT HOME:  Prior to Admission medications   Medication Sig Start Date End Date Taking? Authorizing Provider  alendronate (FOSAMAX) 70 MG tablet Take 70 mg by mouth every Friday.  07/10/16  Yes [provider]  amLODipine (NORVASC) 10 MG tablet Take 10 mg by mouth at bedtime.  01/21/17 01/21/18 Yes [provider]  aspirin EC 81 MG tablet Take 81 mg by mouth daily.  09/20/14  Yes [provider]  Calcium Carb-Cholecalciferol 600-800 MG-UNIT TABS Take 1 tablet by mouth daily.  07/10/16  Yes [provider]  carvedilol (COREG) 25 MG tablet Take 50 mg by mouth 2 (two) times daily with a meal.  01/20/17 03/31/19 Yes [provider]  cetirizine (ZYRTEC) 10 MG tablet Take 10 mg by mouth at bedtime.    Yes [provider]  colchicine-probenecid  0.5-500 MG tablet Take 1 tablet by mouth 2 (two) times daily.  07/10/16 08/17/17 Yes [provider]  dexamethasone (DECADRON) 4 MG tablet Take 1 tablet (4 mg total) by mouth daily. 07/01/17  Yes Lloyd Huger, MD  docusate sodium (COLACE) 100 MG capsule Take 200-400 mg by mouth at bedtime.    Yes [provider]  esomeprazole (NEXIUM) 40 MG capsule Take 40 mg by mouth daily.  07/10/16 08/17/17 Yes [provider]  gabapentin (NEURONTIN) 300 MG capsule Take 300 mg by mouth 3 (three) times daily.  01/20/17 03/30/37 Yes  [provider]  Hypromellose (ARTIFICIAL TEARS OP) Apply 1 drop to eye daily as needed (dry eyes).   Yes [provider]  levETIRAcetam (KEPPRA) 500 MG tablet Take 1 tablet (500 mg total) by mouth 2 (two) times daily. 07/01/17  Yes Lloyd Huger, MD  Melatonin 10 MG TABS Take 20 mg by mouth at bedtime.   Yes [provider]  ondansetron (ZOFRAN) 8 MG tablet Take 1 tablet (8 mg total) by mouth 2 (two) times daily as needed for refractory nausea / vomiting. 01/31/17  Yes Lloyd Huger, MD  prochlorperazine (COMPAZINE) 10 MG tablet Take 1 tablet (10 mg total) by mouth every 6 (six) hours as needed (Nausea or vomiting). 01/31/17  Yes Lloyd Huger, MD  simvastatin (ZOCOR) 20 MG tablet Take 20 mg by mouth every evening.  12/05/16  Yes [provider]  acetaminophen (TYLENOL) 500 MG tablet Take 500 mg by mouth 2 (two) times daily as needed for moderate pain.    [provider]  amoxicillin-clavulanate (AUGMENTIN) 875-125 MG tablet Take 1 tablet by mouth 2 (two) times daily. For 7 days. Patient not taking: Reported on 08/17/2017 05/29/17   Sindy Guadeloupe, MD  cloNIDine (CATAPRES - DOSED IN MG/24 HR) 0.3 mg/24hr patch Place 0.3 mg onto the skin once a week. Change patch on Wednesdays 01/21/17 03/30/37  [provider]  erythromycin ophthalmic ointment Apply to eyelid margins every night 07/13/15   [provider]      PHYSICAL EXAMINATION:   VITAL SIGNS: Blood pressure 134/63, pulse 72, temperature 98.7 F (37.1 C), temperature source Oral, resp. rate 20, height 5\' 4"  (1.626 m), weight 65.3 kg (144 lb), SpO2 90 %.  GENERAL:  76 y.o.-year-old patient lying in the bed with no acute distress.  EYES: Pupils equal, round, reactive to light and accommodation. No scleral icterus. Extraocular muscles intact.  HEENT: Head atraumatic, normocephalic. Oropharynx and nasopharynx clear.  NECK:  Supple, no jugular venous distention. No thyroid  enlargement, no tenderness.  LUNGS: Normal breath sounds bilaterally, no wheezing, rales,rhonchi or crepitation. No use of accessory muscles of respiration.  CARDIOVASCULAR: S1, S2 normal. No murmurs, rubs, or gallops.  ABDOMEN: Soft, nontender, nondistended. Bowel sounds present. No organomegaly or mass.  EXTREMITIES: No pedal edema, cyanosis, or clubbing.  NEUROLOGIC: Cranial nerves II through XII are intact. Muscle strength 3-4/5 in all extremities. Sensation intact. Gait not checked.  PSYCHIATRIC: The patient is alert and oriented x 2.  SKIN: No obvious rash, lesion, or ulcer.   LABORATORY PANEL:   CBC  Recent Labs Lab 08/17/17 1304  WBC 7.3  HGB 12.5  HCT 38.3  PLT 187  MCV 89.9  MCH 29.4  MCHC 32.7  RDW 16.5*  LYMPHSABS 1.0  MONOABS 0.8  EOSABS 0.1  BASOSABS 0.0   ------------------------------------------------------------------------------------------------------------------  Chemistries   Recent Labs Lab 08/17/17 1304  NA 127*  K 4.0  CL 88*  CO2 27  GLUCOSE 721*  BUN 13  CREATININE 1.08*  CALCIUM 8.0*  AST 15  ALT 17  ALKPHOS 115  BILITOT 0.8   ------------------------------------------------------------------------------------------------------------------ estimated creatinine clearance is 37.1 mL/min (A) (by C-G formula based on SCr of 1.08 mg/dL (H)). ------------------------------------------------------------------------------------------------------------------ No results for input(s): TSH, T4TOTAL, T3FREE, THYROIDAB in the last 72 hours.  Invalid input(s): FREET3   Coagulation profile  Recent Labs Lab 08/17/17 1304  INR 1.00   ------------------------------------------------------------------------------------------------------------------- No results for input(s): DDIMER in the last 72 hours. -------------------------------------------------------------------------------------------------------------------  Cardiac  Enzymes  Recent Labs Lab 08/17/17 1304  TROPONINI 0.03*   ------------------------------------------------------------------------------------------------------------------ Invalid input(s): POCBNP  ---------------------------------------------------------------------------------------------------------------  Urinalysis    Component Value Date/Time   COLORURINE STRAW (A) 04/12/2017 1538   APPEARANCEUR CLEAR (A) 04/12/2017 1538   LABSPEC 1.005 04/12/2017 1538   PHURINE 6.0 04/12/2017 1538   GLUCOSEU NEGATIVE 04/12/2017 1538   HGBUR NEGATIVE 04/12/2017 1538   BILIRUBINUR NEGATIVE 04/12/2017 1538   KETONESUR NEGATIVE 04/12/2017 1538   PROTEINUR NEGATIVE 04/12/2017 1538   NITRITE NEGATIVE 04/12/2017 1538   LEUKOCYTESUR NEGATIVE 04/12/2017 1538     RADIOLOGY: Ct Head Wo Contrast  Result Date: 08/17/2017 CLINICAL DATA:  76 year old female with left-sided weakness and confusion over the past 2 days. 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 Initial encounter. EXAM: CT HEAD WITHOUT CONTRAST TECHNIQUE: Contiguous axial images were obtained from the base of the skull through the vertex without intravenous contrast. COMPARISON:  06/01/2017 brain MR. 04/12/2017 head CT. FINDINGS: Brain: Posterior right frontal metastatic lesion not visualized as on prior examinations which may represent response to treatment. Contrast-enhanced MR would be best for assessing possibility of residual or progressive metastatic disease. No intracranial hemorrhage or CT evidence of large acute infarct. Prominent chronic microvascular changes. Atrophy without hydrocephalus. Vascular: Vascular calcifications Skull: Scattered lucencies similar to prior exam. Sinuses/Orbits: No acute orbital abnormality. Almost complete opacification right maxillary sinus as previously noted. Other: Mastoid air cells and middle ear cavities are clear. IMPRESSION:  Posterior right frontal metastatic lesion not visualized as on prior examinations which may represent response to treatment. Contrast-enhanced MR would be best for assessing possibility of residual or progressive metastatic disease. No intracranial hemorrhage or CT evidence of large acute infarct. Prominent chronic microvascular changes. Atrophy without hydrocephalus. Almost complete opacification right maxillary sinus as previously noted. Electronically Signed   By: Genia Del M.D.   On: 08/17/2017 13:07   Dg Chest Portable 1 View  Result Date: 08/17/2017 CLINICAL DATA:  History of lung carcinoma.  Hypertension.  Weakness. EXAM: PORTABLE CHEST 1 VIEW COMPARISON:  Chest radiograph April 12, 2017; PET-CT May 20, 2017 FINDINGS: Port-A-Cath tip is in the superior vena cava near the cavoatrial junction. No pneumothorax. There is patchy bibasilar atelectasis. The lungs elsewhere appear clear. These small nodular opacity in the right upper lobe seen on recent PET study is not appreciable by radiography. Heart size is upper normal with pulmonary vascularity within normal limits. There is aortic atherosclerosis. No adenopathy. No evident bone lesions. IMPRESSION: Bibasilar atelectasis. No edema or consolidation. Stable cardiac prominence. Aortic atherosclerosis present. Aortic Atherosclerosis (ICD10-I70.0). Electronically Signed   By: Lowella Grip III M.D.   On: 08/17/2017 13:40    EKG: Orders placed or performed during the hospital encounter of 08/17/17  . ED EKG  . ED EKG    IMPRESSION AND PLAN:   * Hyperglycemia   Likely due to Decadrone.    Check HB  a1c   Came down some.   Does not need Insuline drip now.   Start on lantus now, and keep on ISS>    * Lung mass with brain mets   Follows with Dr. Grayland Ormond, on decadron, had radiation.    Cont that for now.  * Htn   Cont home meds.  * Hyperlipidemia   COnt simvastatin.  * Seizures    Cont home meds  All the records are reviewed and  case discussed with ED provider. Management plans discussed with the patient, family and they are in agreement.  CODE STATUS: DNR Code Status History    This patient does not have a recorded code status. Please follow your organizational policy for patients in this situation.     Spoke to her daughter in room.  TOTAL TIME TAKING CARE OF THIS PATIENT: 45 minutes.    Vaughan Basta M.D on 08/17/2017   Between 7am to 6pm - Pager - [phone]  After 6pm go to [email or web address] - password [email or web address] [facility]  Office  [phone]  CC: Primary care physician; [name], NP   Note: This dictation was prepared with Dragon dictation along with smaller phrase technology. Any transcriptional errors that result from this process are unintentional.

## 2017-08-17 NOTE — Progress Notes (Signed)
PHARMACIST - PHYSICIAN COMMUNICATION  CONCERNING: P&T Medication Policy Regarding Oral Bisphosphonates  RECOMMENDATION: Your order for alendronate (Fosamax), ibandronate (Boniva), or risedronate (Actonel) has been discontinued at this time.  If the patient's post-hospital medical condition warrants safe use of this class of drugs, please resume the pre-hospital regimen upon discharge.  DESCRIPTION:  Alendronate (Fosamax), ibandronate (Boniva), and risedronate (Actonel) can cause severe esophageal erosions in patients who are unable to remain upright at least 30 minutes after taking this medication.   Since brief interruptions in therapy are thought to have minimal impact on bone mineral density, the [facility] has established that bisphosphonate orders should be routinely discontinued during hospitalization.   To override this safety policy and permit administration of Boniva, Fosamax, or Actonel in the hospital, prescribers must write "DO NOT HOLD" in the comments section when placing the order for this class of medications.   Ulice Dash, PharmD Clinical Pharmacist

## 2017-08-17 NOTE — ED Notes (Signed)
Date and time results received: 08/17/17   Test: troponin, CBG  Critical Value: 0.03, 721  Name of Provider Notified: Dr Kerman Passey

## 2017-08-18 LAB — CBC
HEMATOCRIT: 38.5 % (ref 35.0–47.0)
HEMOGLOBIN: 12.9 g/dL (ref 12.0–16.0)
MCH: 29.6 pg (ref 26.0–34.0)
MCHC: 33.6 g/dL (ref 32.0–36.0)
MCV: 88.1 fL (ref 80.0–100.0)
Platelets: 187 10*3/uL (ref 150–440)
RBC: 4.37 MIL/uL (ref 3.80–5.20)
RDW: 16.1 % — ABNORMAL HIGH (ref 11.5–14.5)
WBC: 7.3 10*3/uL (ref 3.6–11.0)

## 2017-08-18 LAB — MAGNESIUM: Magnesium: 1.2 mg/dL — ABNORMAL LOW (ref 1.7–2.4)

## 2017-08-18 LAB — GLUCOSE, CAPILLARY
GLUCOSE-CAPILLARY: 71 mg/dL (ref 65–99)
GLUCOSE-CAPILLARY: 134 mg/dL — AB (ref 65–99)
Glucose-Capillary: 205 mg/dL — ABNORMAL HIGH (ref 65–99)
Glucose-Capillary: 155 mg/dL — ABNORMAL HIGH (ref 65–99)
Glucose-Capillary: 53 mg/dL — ABNORMAL LOW (ref 65–99)
Glucose-Capillary: 65 mg/dL (ref 65–99)
Glucose-Capillary: 93 mg/dL (ref 65–99)

## 2017-08-18 LAB — BASIC METABOLIC PANEL
Anion gap: 7 (ref 5–15)
BUN: 9 mg/dL (ref 6–20)
CHLORIDE: 100 mmol/L — AB (ref 101–111)
CO2: 28 mmol/L (ref 22–32)
Calcium: 7.8 mg/dL — ABNORMAL LOW (ref 8.9–10.3)
Creatinine, Ser: 0.66 mg/dL (ref 0.44–1.00)
GFR calc non Af Amer: >60 mL/min (ref >60)
Glucose, Bld: 212 mg/dL — ABNORMAL HIGH (ref 65–99)
POTASSIUM: 3.3 mmol/L — AB (ref 3.5–5.1)
SODIUM: 135 mmol/L (ref 135–145)

## 2017-08-18 LAB — HEMOGLOBIN A1C
Hgb A1c MFr Bld: >15.5 % — ABNORMAL HIGH (ref 4.8–5.6)
Mean Plasma Glucose: >398 mg/dL

## 2017-08-18 MED ORDER — PNEUMOCOCCAL VAC POLYVALENT 25 MCG/0.5ML IJ INJ
0.5000 mL | INJECTION | INTRAMUSCULAR | Status: AC
  Administered 2017-08-19: 0.5 mL via INTRAMUSCULAR
  Filled 2017-08-18: qty 0.5

## 2017-08-18 MED ORDER — INFLUENZA VAC SPLIT HIGH-DOSE 0.5 ML IM SUSY
0.5000 mL | PREFILLED_SYRINGE | INTRAMUSCULAR | Status: AC
  Administered 2017-08-19: 11:00:00 0.5 mL via INTRAMUSCULAR
  Filled 2017-08-18: qty 0.5

## 2017-08-18 MED ORDER — ENSURE ENLIVE PO LIQD
237.0000 mL | Freq: Three times a day (TID) | ORAL | Status: DC
  Administered 2017-08-18 – 2017-08-21 (×8): 237 mL via ORAL

## 2017-08-18 MED ORDER — POTASSIUM CHLORIDE 20 MEQ PO PACK
40.0000 meq | PACK | Freq: Once | ORAL | Status: AC
  Administered 2017-08-18: 40 meq via ORAL
  Filled 2017-08-18: qty 2

## 2017-08-18 MED ORDER — INSULIN STARTER KIT- PEN NEEDLES (ENGLISH)
1.0000 | Freq: Once | Status: AC
  Administered 2017-08-18: 14:00:00 1
  Filled 2017-08-18: qty 1

## 2017-08-18 MED ORDER — LIVING WELL WITH DIABETES BOOK
Freq: Once | Status: AC
  Administered 2017-08-18: 14:00:00
  Filled 2017-08-18: qty 1

## 2017-08-18 NOTE — Progress Notes (Signed)
   08/18/17 0700  Clinical Encounter Type  Visited With Health care provider;Patient not available  Visit Type Initial;Other (Comment) (HCPOA)  Referral From Nurse  Spiritual Encounters  Spiritual Needs Literature  CH dropped off HCPOA materials in room; patient was sleeping; RN informed; [place] available to review as needed.

## 2017-08-18 NOTE — Clinical Social Work Note (Addendum)
Clinical Social Work Assessment  Patient Details  Name: Tammie Buchanan MRN: [id number] Date of Birth: Apr 03, 1941  Date of referral:  08/18/17               Reason for consult:  Discharge Planning, Facility Placement                Permission sought to share information with:    Permission granted to share information::     Name::        Agency::     Relationship::     Contact Information:     Housing/Transportation Living arrangements for the past 2 months:  Single Family Home Source of Information:  Adult Children Patient Interpreter Needed:  None Criminal Activity/Legal Involvement Pertinent to Current Situation/Hospitalization:  No - Comment as needed Significant Relationships:  Adult Children Lives with:  Adult Children Do you feel safe going back to the place where you live?  Yes Need for family participation in patient care:  Yes (Comment)  Care giving concerns:  Patient lives at home in [place] with daughter Tammie Buchanan.   Social Worker assessment / plan:  Holiday representative (CSW) received SNF consult. PT is recommending SNF. Social work Theatre manager attempted to meet with patient at bedside but she was asleep. Patients daughter Tammie Buchanan [phone]) was at bedside and was able to speak on behalf of her mother. Social work Theatre manager introduced self and explained the role of the [place]. Daughter states that the patient was living by herself in [place] but she has moved in to assist her. Patient's daughter also stated that patient will see her oncologist on 09/10/17 and has a scan appointment on 09/03/17. Patient's daughter stated that she transports her mother to [facility] for treatment 3 days a week every 3 weeks. Social work Theatre manager explained that PT is recommending SNF and the SNF process. Social work Theatre manager explained that [employer] would have to approve. Patients daughter verbalized understanding and stated that she knows her mother would  rather go home and she is comfortable taking care of her at home. Patients daughter also shared that her mother has completed inpatient rehab at [facility] before declined SNF. CSW and Social work Theatre manager will continue to follow up and assist. RN case Freight forwarder aware of above.    Employment status:  Retired Nurse, adult PT Recommendations:  [place] / Referral to community resources:     Patient/Family's Response to care:  Patient's daughter will take patient home and assist her.  Patient/Family's Understanding of and Emotional Response to Diagnosis, Current Treatment, and Prognosis:  Patient's daughter was very pleasant and thanked social work Theatre manager for her assistance.   Emotional Assessment Appearance:  Appears stated age Attitude/Demeanor/Rapport:  Unable to Assess Affect (typically observed):  Unable to Assess Orientation:  Oriented to Self, Oriented to Situation Alcohol / Substance use:  Not Applicable Psych involvement (Current and /or in the community):  No (Comment)  Discharge Needs  Concerns to be addressed:  Discharge Planning Concerns, Care Coordination Readmission within the last 30 days:  No Current discharge risk:  Dependent with Mobility Barriers to Discharge:  Continued Medical Work up   Smith Mince, Student-Social Work 08/18/2017, 1:38 PM

## 2017-08-18 NOTE — Progress Notes (Addendum)
Inpatient Diabetes Program Recommendations  AACE/ADA: New Consensus Statement on Inpatient Glycemic Control (2015)  Target Ranges:  Prepandial:   less than 140 mg/dL      Peak postprandial:   less than 180 mg/dL (1-2 hours)      Critically ill patients:  140 - 180 mg/dL   Results for LYANA, ASBILL (MRN [id number]) as of 08/18/2017 07:49  Ref. Range 08/17/2017 12:12 08/17/2017 12:12 08/17/2017 16:09 08/17/2017 17:14 08/17/2017 18:23 08/17/2017 20:30 08/18/2017 07:36  Glucose-Capillary Latest Ref Range: 65 - 99 mg/dL >600 (HH) >600 (HH) 425 (H) 350 (H) 339 (H) 358 (H) 205 (H)   Results for NAZLY, DIGILIO (MRN [id number]) as of 08/18/2017 07:49  Ref. Range 08/17/2017 13:04  Glucose Latest Ref Range: 65 - 99 mg/dL [facility])   Results for NAISHA, WISDOM (MRN [id number]) as of 08/18/2017 07:49  Ref. Range 02/09/2017 08:06 02/16/2017 11:30 03/02/2017 08:53 03/24/2017 09:29 03/30/2017 08:56 04/12/2017 15:23 04/20/2017 08:46 04/27/2017 08:59 05/25/2017 09:30  Glucose Latest Ref Range: 65 - 99 mg/dL 192 (H) 169 (H) 169 (H) 186 (H) 228 (H) 139 (H) 161 (H) 293 (H) 283 (H)   Review of Glycemic Control  Diabetes history: DM2 Outpatient Diabetes medications: None Current orders for Inpatient glycemic control: Lantus 10 units QHS, Novolog 0-9 units TID with meals, Novolog 5 units TID with meals  Inpatient Diabetes Program Recommendations: Insulin - Basal: Since Decadron is no longer going to be given, recommend leaving Lantus dose as ordered. Correction (SSI): Please consider ordering Novolog 0-5 units QHS for bedtime correction scale. HgbA1C: A1C in process. Outpatient Plan for DM regimen: Since patient is no longer going to take Decadron, would recommend discharging on low dose Lantus QHS. Please provide clear instructions for family to call PCP if patient has any hypoglycemia or glucose remains consistently greater than 200 mg/dl after 3-4 days at home for insulin adjustment instructions.  NOTE:  In reviewing chart, noted patient has a history of DM2 but was not on any DM medications as an outpatient. Noted in [place] from office visit on 01/29/17 with C.F. Phineas Douglas, NP "T2DM - Instructed her to start checking fasting blood sugars each morning. Josh with care assistance will be contacting her to check on her blood glucose readings. She will be receiving steroids prior to starting chemotherapy next week, so she may require DM treatment in the future". In reviewing lab glucose values from February 09, 2017 to May 25, 2017 glucose ranged from 169-293 mg/dl. Patient is taking Decadron as an outpatient and is currently ordered Decadron 4 mg daily which is contributing to hyperglycemia. Talked with Gerald Stabs, RN caring for patient today regarding patient DM2 history and to inquire about insulin at discharge. Unclear what discharge plan will be at this time.  Addendum 08/18/17@13 :25-Spoke with patient's daughter about patient history of diabetes and home regimen for diabetes control. Patient's daughter reports that patient has always been able to control DM in the past with diet alone and they were monitoring glucose at home but it seemed to be staying around an average of 128 mg/dl so the family stopped monitoring glucose levels. Patient has been on recent Decadron outpatient for about 4-5 weeks and family was in the process of tapering the Decadron off. Patent's daughter reports that patient's last dose of Decadron was suppose to be yesterday and family did not want patient to receive Decadron today and asked that it be stopped since they were planning for her last dose to be on 08/17/17. Patient's  daughter reports that her mother was having symptoms of hyperglycemia (excessive urination, thirst, and more tired than usual) but she was not aware of symptoms of elevated glucose. Discussed impact of nutrition, exercise, stress, sickness, and medications on diabetes control. Discussed impact of steroids on glycemic  control.  Discussed basic pathophysiology of DM Type 2, basic home care, importance of checking CBGs and maintaining good CBG control to prevent long-term and short-term complications. Reviewed glucose goals.  Reviewed signs and symptoms of hyperglycemia and hypoglycemia along with treatment for both. Explained that since patient was no longer going to be taking Decadron, glucose should improve and the insulin dose may need to be adjusted as steroids wear off. Patient's daughter states that she has been staying with her mother each night so she will plan to administer insulin injection. Discussed Lantus and Novolog insulin and explained how they are currently ordered. Explained that patient will likely just be discharged on Lantus which may need to be adjusted.  Encouraged patient's daughter to call her PCP if any CBGs are less than 70 mg/dl or if glucose is consistently greater than 200 mg/dl after 3-4 days at home to ask for insulin adjustment recommendations. Asked that patient's glucose be checked at least 4 times per day (before meals and at bedtime) and to keep a log book of glucose readings and insulin taken. Educated patient's daughter on insulin pen use at home. Reviewed all steps of insulin pen including attachment of needle, 2-unit air shot, dialing up dose, giving injection, removing needle, disposal of sharps, storage of unused insulin, disposal of insulin etc. Patient's daughter is able to provide successful return demonstration. Informed patient's daughter an insulin pen starter kit and a Living Well With Diabetes book would be ordered and encouraged to read. Patient's daughter verbalized understanding of information discussed and she states that she has no further questions at this time related to diabetes.  RNs to provide ongoing basic DM education at bedside with this patient and her family and engage them to administer insulin injections.   Thanks, Barnie Alderman, RN, MSN, CDE Diabetes  Coordinator Inpatient Diabetes Program [phone] (Team Pager from 8am to 5pm)

## 2017-08-18 NOTE — Evaluation (Signed)
Physical Therapy Evaluation Patient Details Name: Tammie Buchanan MRN: [id number] DOB: Aug 22, 1941 Today's Date: 08/18/2017   History of Present Illness  Pt is a 76 y.o. 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 Presented to ED on 08/17/17 with increased confusion over the last 4-5 days and hyperglycemia. Pt is currently receiving chemotherapy for her cancer.  Clinical Impression  As per pt's daughter, prior to the last 2 weeks, pt was able to be independent during the day with fixing meals and self care, but over the past 2 weeks prior to current hospital admission, pt required assist for all ADLs and mod-max assist for transfers and mobility.  Pt lives at home, daughter has been staying with her for the past several weeks.  Currently pt is mod-max assist for bed mobility and sitting balance; mod-max assist x2 for transfers and gait (77ft to chair with RW); confusion more than baseline, generalized weakness L>R.  Pt would benefit from skilled PT to address noted impairments and functional limitations (see below for any additional details).  Upon hospital discharge, recommend pt discharge to SNF.     Follow Up Recommendations SNF    Equipment Recommendations  Rolling walker with 5" wheels    Recommendations for Other Services OT consult     Precautions / Restrictions Precautions Precautions: Fall Restrictions Weight Bearing Restrictions: No      Mobility  Bed Mobility Overal bed mobility: Needs Assistance Bed Mobility: Supine to Sit     Supine to sit: Mod assist;Max assist;HOB elevated     General bed mobility comments: mod-max assist for LE movement, trunk elevation with max vc's for sequencing and encouragement  Transfers Overall transfer level: Needs assistance Equipment used: Rolling walker (2 wheeled) Transfers: Sit to/from Stand Sit to Stand: Mod assist;+2 physical assistance          General transfer comment: mod assist x2 for sit to stand with heavy vc's for sequencing and encouragement to push through legs into upright position; pt more responsive to daughter's cuing, daughter demonstrates she assists pt from the R side with arm under pt's R arm  Ambulation/Gait Ambulation/Gait assistance: Mod assist;Max assist;+2 physical assistance Ambulation Distance (Feet): 4 Feet Assistive device: Rolling walker (2 wheeled)       General Gait Details: Mod-max assist x2 for balance support and gait assist to chair; heavy vc's for sequencing and encouragement; short steps, minimal foot clearance L>R  Stairs            Wheelchair Mobility    Modified Rankin (Stroke Patients Only)       Balance Overall balance assessment: Needs assistance Sitting-balance support: Feet supported;Single extremity supported Sitting balance-Leahy Scale: Zero Sitting balance - Comments: mod-max assist for maintaining seated balance without falling posterior-laterally to L side, vc's for sitting up tall Postural control: Left lateral lean Standing balance support: Single extremity supported Standing balance-Leahy Scale: Zero Standing balance comment: mod-max assist to maintain static and dynamic standing balance with vc's for standing upright and extending legs                             Pertinent Vitals/Pain Pain Assessment: No/denies pain    Home Living Family/patient expects to be discharged to:: Private residence Living Arrangements: Children (daughter does not live with pt, but she has been staying with her for several weeks now) Available Help at Discharge: Family Type of Home: Mobile home  Home Access: Ramped entrance     Home Layout: One level Home Equipment: [place] - 4 wheels;Bedside commode;Shower seat;Grab bars - toilet;Grab bars - tub/shower;Wheelchair - manual      Prior Function Level of Independence: Needs assistance         Comments: Pt's  daughter reports that over the past 2 weeks, pt has required assist for all ADLs, and mod-max assist for transfers and mobility; pt has has HHPT/OT 2x/wk each over the past two weeks and was able to ambulate household distances with the rollator, but sometimes becomes tired and needs to sit on the rollator chair and be wheeled back. Prior to recent decline in functional status, daughter reports pt was able to be alone in her home during the day, fix meals and perform self care with rollator for mobility. Pt requires w/c to get from her home into the car to go to doctors appointments. Pt has a hx of several falls within the past year secondary to muscle weakness.     Hand Dominance        Extremity/Trunk Assessment   Upper Extremity Assessment Upper Extremity Assessment: Generalized weakness (not assessed specifically; at least 3/5 on the R; L elbow flexion at least 3/5, shoulder flexion at least 2/5, unable to elevate LUE without assist from RUE)    Lower Extremity Assessment Lower Extremity Assessment: Generalized weakness (not assess specifically; at least 3/5 on the R, 3-/5 on L)       Communication      Cognition Arousal/Alertness: Lethargic Behavior During Therapy: Flat affect Overall Cognitive Status: Impaired/Different from baseline                                 General Comments: Pt asleep when PT entered room, awoken easily and pleasant while awake, but pt fell back asleep several times while PT collected subjective history from pt and pt's daughter; pt able to recall name, birthdate, place, her dog's name/breed; unable to recall date, president, reason for hospital visit. Daughter reports confusion has been on and off, but does not have baseline confusion.      General Comments General comments (skin integrity, edema, etc.): pt's LUE cubital dressing tore at skin, bleeding; nursing notified and cleaned/redressed wound    Exercises Total Joint Exercises Ankle  Circles/Pumps: AROM;Both;10 reps;Supine Heel Slides: AROM;Both;10 reps;Supine   Assessment/Plan    PT Assessment Patient needs continued PT services  PT Problem List Decreased strength;Decreased range of motion;Decreased activity tolerance;Decreased balance;Decreased mobility;Decreased cognition       PT Treatment Interventions DME instruction;Gait training;Stair training;Functional mobility training;Therapeutic activities;Therapeutic exercise;Patient/family education;Balance training    PT Goals (Current goals can be found in the Care Plan section)  Acute Rehab PT Goals Patient Stated Goal: to go home PT Goal Formulation: With patient Time For Goal Achievement: 09/01/17 Potential to Achieve Goals: Fair    Frequency Min 2X/week   Barriers to discharge        Co-evaluation               AM-PAC PT "6 Clicks" Daily Activity  Outcome Measure Difficulty turning over in bed (including adjusting bedclothes, sheets and blankets)?: Unable Difficulty moving from lying on back to sitting on the side of the bed? : Unable Difficulty sitting down on and standing up from a chair with arms (e.g., wheelchair, bedside commode, etc,.)?: Unable Help needed moving to and from a bed to chair (including a wheelchair)?:  Total Help needed walking in hospital room?: Total Help needed climbing 3-5 steps with a railing? : Total 6 Click Score: 6    End of Session Equipment Utilized During Treatment: Gait belt Activity Tolerance: Patient limited by fatigue Patient left: in chair;with call bell/phone within reach;with chair alarm set;with family/visitor present Nurse Communication: Mobility status PT Visit Diagnosis: Unsteadiness on feet (R26.81);Other abnormalities of gait and mobility (R26.89);Muscle weakness (generalized) (M62.81);History of falling (Z91.81)    Time: 8127-5170 PT Time Calculation (min) (ACUTE ONLY): 52 min   Charges:         PT G CodesWetzel Bjornstad,  SPT 08/18/2017, 12:00 PM

## 2017-08-18 NOTE — Progress Notes (Signed)
Initial Nutrition Assessment  DOCUMENTATION CODES:   Not applicable  INTERVENTION:  Provide Ensure Enlive po TID, each supplement provides 350 kcal and 20 grams of protein.  Encouraged adequate intake of calories and protein to prevent unintentional weight loss and loss of lean body mass.  NUTRITION DIAGNOSIS:   Inadequate oral intake related to poor appetite, cancer and cancer related treatments as evidenced by per patient/family report, meal completion < 25%.  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   -Following SLP evaluation patient was put on dysphagia 3 diet with minced meats and thin liquids.  Met with patient and her daughter at bedside. Patient reports her appetite is pretty good. Daughter reports that it has been poor today and yesterday and she is only eating bites. She reports patient typically does have a very good appetite, even when she was receiving treatment earlier (reports treatment stopped 3 months ago and they have another scan coming up next month). Patient typically eats a good breakfast with eggs, toast, and sausage/bacon. She will have a mid-morning snack of salsa and chips, which is her favorite snack. She will eat a good lunch and then another snack (bowl of grapes or some watermelon). Dinner is usually some chicken with vegetables.  Patient reports she actually gained weight during her treatment and has not lost any weight so far. She reports she is now 14 lbs heavier than when she began her treatment. Earliest weight in chart is 133 lbs in 01/2017. Unable to verify current weight as patient was sitting in chair.  Meal Completion: bites so far  today  Medications reviewed and include: Oscal with D 1 tablet daily, Colace, Novolog 0-9 units TID, Novolog 5 units TID, Lantus 10 units QHS, Keppra, pantoprazole, potassium chloride 40 mEq once today, NS @ 50 ml/hr. Noted order for Decadron has now been discontinued.  Labs reviewed: CBG 155-425 past 24 hrs, Potassium 3.3, Chloride 100, Magnesium 1.2.  Nutrition-Focused physical exam completed. Findings are no fat depletion, mild muscle depletion (temple region, clavicle bone region, clavicle/acromion bone region, dorsal hand), and no edema.   Patient does not meet criteria for malnutrition at this time but is at risk for malnutrition if inadequate intake continues.  Discussed with SLP.  Diet Order:  DIET DYS 3 Room service appropriate? Yes with Assist; Fluid consistency: Thin  Skin:  Wound (see comment) (skin tears to arm, hand, leg)  Last BM:  PTA  Height:   Ht Readings from Last 1 Encounters:  08/17/17 5' 4"  (1.626 m)    Weight:   Wt Readings from Last 1 Encounters:  08/17/17 144 lb (65.3 kg)    Ideal Body Weight:     BMI:  Body mass index is 24.72 kg/m.  Estimated Nutritional Needs:   Kcal:  1460-1690 (MSJ x 1.3-1.5)  Protein:  80-98 grams (1.2-1.5 grams/kg)  Fluid:  1.6 L/day (25 ml/kg)  EDUCATION NEEDS:   Education needs addressed  Willey Blade, [place], [place], [place] Office: [phone] Pager: [phone] After Hours/Weekend Pager: [phone]

## 2017-08-18 NOTE — NC FL2 (Signed)
[place] LEVEL OF CARE SCREENING TOOL     IDENTIFICATION  Patient Name: Tammie Buchanan Birthdate: 1941-09-07 Sex: female Admission Date (Current Location): 08/17/2017  [place] and [place] Number:  Engineering geologist and Address:  [facility], [address] [place], [place] [address]      Provider Number: [id number]  Attending Physician Name and Address:  Dustin Flock, MD  Relative Name and Phone Number:       Current Level of Care: Hospital Recommended Level of Care: [place] Prior Approval Number:    Date Approved/Denied:   PASRR Number:  ([id number] )  Discharge Plan: SNF    Current Diagnoses: Patient Active Problem List   Diagnosis Date Noted  . Hyperglycemia 08/17/2017  . Altered mental status 08/17/2017  . Encounter for antineoplastic chemotherapy 05/24/2017  . Seizure ([place]) 04/12/2017  . Small cell lung cancer ([place]) 01/29/2017  . Brain metastasis ([place]) 01/07/2017  . Lung metastases ([place]) 01/03/2017  . Right frontal lobe mass 01/02/2017  . 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 DNR (do not resuscitate) 04/14/2016  . Tobacco abuse 12/12/2015  . Pain in right eye 04/24/2015  . GERD (gastroesophageal reflux disease) 08/15/2013  . Hyperlipidemia 08/15/2013  . Diabetes mellitus, type II ([place]) 03/09/2013  . Gout 03/03/2013  . 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 Zoster 02/16/2013  . Chronic kidney disease, stage III (moderate) ([place]) 10/03/2012  . Degenerative drusen 08/30/2012  . Status post cataract extraction 08/26/2011  . Transient cerebral ischemia 02/19/2010  . Osteoporosis 09/25/2009  . Type II diabetes mellitus ([place]) 11/24/2008  . Essential hypertension 01/27/1997    Orientation RESPIRATION BLADDER Height & Weight     Self, Situation  Normal Incontinent Weight: 144 lb (65.3 kg) Height:  5\' 4"  (162.6 cm)  BEHAVIORAL SYMPTOMS/MOOD NEUROLOGICAL BOWEL NUTRITION  STATUS      Continent Diet (Diet: DYS 3 )  AMBULATORY STATUS COMMUNICATION OF NEEDS Skin   Extensive Assist Verbally Normal                       Personal Care Assistance Level of Assistance  Bathing, Feeding, Dressing Bathing Assistance: Limited assistance Feeding assistance: Independent Dressing Assistance: Limited assistance     Functional Limitations Info  Sight, Hearing, Speech Sight Info: Adequate Hearing Info: Impaired Speech Info: Adequate    SPECIAL CARE FACTORS FREQUENCY  PT (By licensed PT), OT (By licensed OT)     PT Frequency:  (5) OT Frequency:  (5)            Contractures      Additional Factors Info  Code Status, Allergies   Code Status Info:  (DNR )  Allergies: (Ace Inhibitors, Allopurinol, Angiotensin Receptor Blockers, Codeine, Enalapril, Furosemide, Hydralazine, Hydrochlorothiazide, Irbesartan, Levofloxacin, Lidocaine, Naproxen Sodium, Spironolactone, Tramadol)              Current Medications (08/18/2017):  This is the current hospital active medication list Current Facility-Administered Medications  Medication Dose Route Frequency Provider Last Rate Last Dose  . 0.9 %  sodium chloride infusion   Intravenous Continuous Vaughan Basta, MD 50 mL/hr at 08/17/17 1840    . acetaminophen (TYLENOL) tablet 500 mg  500 mg Oral BID PRN Vaughan Basta, MD      . amLODipine (NORVASC) tablet 10 mg  10 mg Oral QHS Vaughan Basta, MD   10 mg at 08/17/17 2124  . aspirin EC tablet 81 mg  81 mg Oral Daily  Vaughan Basta, MD   81 mg at 08/18/17 5784  . calcium-vitamin D (OSCAL WITH D) 500-200 MG-UNIT per tablet 1 tablet  1 tablet Oral Daily Vaughan Basta, MD      . carvedilol (COREG) tablet 50 mg  50 mg Oral BID WC Vaughan Basta, MD   50 mg at 08/17/17 1844  . colchicine tablet 0.6 mg  0.6 mg Oral BID Vaughan Basta, MD   0.6 mg at 08/18/17 6962   And  . probenecid (BENEMID) tablet 500 mg  500 mg  Oral BID Vaughan Basta, MD   500 mg at 08/18/17 0936  . docusate sodium (COLACE) capsule 100 mg  100 mg Oral BID PRN Vaughan Basta, MD      . docusate sodium (COLACE) capsule 200 mg  200 mg Oral QHS Vaughan Basta, MD   200 mg at 08/17/17 2123  . gabapentin (NEURONTIN) capsule 300 mg  300 mg Oral TID Vaughan Basta, MD   300 mg at 08/18/17 0936  . heparin injection 5,000 Units  5,000 Units Subcutaneous Q8H Vaughan Basta, MD   5,000 Units at 08/18/17 0503  . [START ON 08/19/2017] Influenza vac split quadrivalent PF (FLUZONE HIGH-DOSE) injection 0.5 mL  0.5 mL Intramuscular Tomorrow-1000 Dustin Flock, MD      . insulin aspart (novoLOG) injection 0-9 Units  0-9 Units Subcutaneous TID WC Vaughan Basta, MD   3 Units at 08/18/17 0804  . insulin aspart (novoLOG) injection 5 Units  5 Units Subcutaneous TID WC Vaughan Basta, MD   5 Units at 08/18/17 0804  . insulin glargine (LANTUS) injection 10 Units  10 Units Subcutaneous QHS Vaughan Basta, MD   10 Units at 08/18/17 0007  . levETIRAcetam (KEPPRA) tablet 500 mg  500 mg Oral BID Vaughan Basta, MD   500 mg at 08/18/17 0936  . Melatonin TABS 5 mg  5 mg Oral QHS Vaughan Basta, MD   5 mg at 08/17/17 2124  . ondansetron (ZOFRAN) tablet 8 mg  8 mg Oral BID PRN Vaughan Basta, MD      . pantoprazole (PROTONIX) EC tablet 40 mg  40 mg Oral Daily Vaughan Basta, MD   40 mg at 08/18/17 0936  . [START ON 08/19/2017] pneumococcal 23 valent vaccine (PNU-IMMUNE) injection 0.5 mL  0.5 mL Intramuscular Tomorrow-1000 Dustin Flock, MD      . prochlorperazine (COMPAZINE) tablet 10 mg  10 mg Oral Q6H PRN Vaughan Basta, MD      . simvastatin (ZOCOR) tablet 20 mg  20 mg Oral QPM Vaughan Basta, MD   20 mg at 08/17/17 1844     Discharge Medications: Please see discharge summary for a list of discharge medications.  Relevant Imaging Results:  Relevant  Lab Results:   Additional Information SSN: [id number]  Jahrel Borthwick, Veronia Beets, LCSW

## 2017-08-18 NOTE — Progress Notes (Signed)
Chaplain made a follow up with pt that had wanted to complete AD. [place] met pt and daughter at bedside. Pt was alert and cognizant at the time of this visit but she was having her lunch. [place] asked daughter to ask nurse to page him after lunch and was to follow up with pt at PM. [facility] made a follow up with pt at 3:04 PM, but pt was about to take her nap. [place] to follow up pt as needed.    08/18/17 1500  Clinical Encounter Type  Visited With Patient;Patient and family together  Visit Type Follow-up;Other (Comment)  Referral From Chaplain  Consult/Referral To Chaplain  Spiritual Encounters  Spiritual Needs Literature;Other (Comment)

## 2017-08-18 NOTE — Evaluation (Signed)
Clinical/Bedside Swallow Evaluation Patient Details  Name: Tammie Buchanan MRN: [id number] Date of Birth: 1941-01-31  Today's Date: 08/18/2017 Time: SLP Start Time (ACUTE ONLY): 0900 SLP Stop Time (ACUTE ONLY): 1000 SLP Time Calculation (min) (ACUTE ONLY): 60 min  Past Medical History:  Past Medical History:  Diagnosis Date  . Brain cancer ([place])   . Diabetes mellitus without complication ([place])   . Hypertension   . Lung cancer [facility])    Past Surgical History:  Past Surgical History:  Procedure Laterality Date  . ABDOMINAL HYSTERECTOMY     partial  . APPENDECTOMY    . PORTA CATH INSERTION N/A 02/04/2017   Procedure: [name];  Surgeon: Algernon Huxley, MD;  Location: [place] [facility];  Service: Cardiovascular;  Laterality: N/A;   HPI:  Pt is a 76 y.o. 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  Presents to the emergency department for altered mental status.  According to the daughter over the past 3-4 days the patient has been intermittently confused.  Daughter states at times she appears to be lucid and coherent.  Other times the patient does not even recognize her and appears to be confused.  They have also noted the patient's blood glucose has been elevated.  Patient is currently taking Decadron but this has been a daily medication since August.  Patient denies any complaints at this time.  Denies any chest pain, headache.  Patient does have a history of left-sided weakness due to brain cancer.   Assessment / Plan / Recommendation Clinical Impression  Pt appears to present w/ mild oral phase dysphagia resulting in a min increased risk for aspiration. Pt's daughter was present during evaluation and reported pt's recent behaviors at home (past 3-4 days) including prolonged oral transit, prolonged mastication, and reduced awareness of the bolus.  NSG administered whole meds w/ water and pt had difficulty - daughter reported pt takes whole meds in applesauce at home. Per chart review, pt has multiple medical conditions including lung cancer metastatic to the brain, diabetes, hypertension, GERD, and currently on chemotherapy - in some pt's medications can impact mental status and awareness of tasks, as well as stamina for tasks.  Pt was observed during her breakfast meal and was being assisted by her daughter (primary caretaker). Pt was given trials of thin liquid via straw and puree. ST educated daughter on aspiration precautions - straw pinching to limit bolus amount, sitting upright to eat and drink, small sips/bites, reducing distractions during meal time, and clearing the mouth before giving another bite. Pt exhibited timely swallows and adequate oral clearing during trials of puree and thin liquid. Pt appeared to do best when alternating between sips of thin liquid and bites of puree. No decline in vocal quality or respiratory status were noted during the evaluation. No coughing or choking was noted, however, pt's w/ hx of respiratory conditions can have increased risk to choke/cough when distracted/moved during meals. Pt required assistance/set up for meals but was able to feed herself puree and hold the cup.  D/t pt's overall presentation and reports from daughter, Recommend Dysphagia level 3 diet w/ minced meat and thin liquids at this time to conserve energy. Recommend aspiration precautions and reflux precautions. Recommend meds whole in applesauce (pt takes meds this way at home).  Recommend set up and min assistance at meals and support to reinforce follow through w/ precautions.  ST services will f/u if indicated w/ pt's status, diet toleration, trials to upgrade diet as appropriate, and education while admitted. NSG/MD updated.  SLP Visit Diagnosis: Dysphagia, oral phase (R13.11)    Aspiration Risk  Mild aspiration risk but reduced  following general aspiration precautions   Diet Recommendation   Dysphagia level 3 diet w/ minced meat w/ thin liquids; aspiration precautions.   Medication Administration: Whole meds with puree (pt takes meds this way at home)    Other  Recommendations Recommended Consults:  (dietician f/u) Oral Care Recommendations: Oral care BID;Staff/trained caregiver to provide oral care   Follow up Recommendations  (TBD)      Frequency and Duration min 2x/week  1 week       Prognosis Prognosis for Safe Diet Advancement: Good      Swallow Study   General Date of Onset: 08/17/17 HPI: Pt is a 76 y.o. 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  Presents to the emergency department for altered mental status.  According to the daughter over the past 3-4 days the patient has been intermittently confused.  Daughter states at times she appears to be lucid and coherent.  Other times the patient does not even recognize her and appears to be confused.  They have also noted the patient's blood glucose has been elevated.  Patient is currently taking Decadron but this has been a daily medication since August.  Patient denies any complaints at this time.  Denies any chest pain, headache.  Patient does have a history of left-sided weakness due to brain cancer. Type of Study: Bedside Swallow Evaluation Previous Swallow Assessment: none reported Diet Prior to this Study: Regular;Thin liquids Temperature Spikes Noted: No (wbc WNL) Respiratory Status: Room air History of Recent Intubation: No Behavior/Cognition: Alert;Cooperative;Pleasant mood;Distractible;Requires cueing Oral Cavity Assessment: Within Functional Limits Oral Care Completed by SLP: Recent completion by staff Oral Cavity - Dentition: Adequate natural dentition Vision: Functional for self-feeding Self-Feeding Abilities:  Able to feed self;Needs assist;Needs set up Patient Positioning: Upright in bed Baseline Vocal Quality: Low vocal intensity Volitional Cough: Strong Volitional Swallow: Able to elicit    Oral/Motor/Sensory Function Overall Oral Motor/Sensory Function: Within functional limits   Ice Chips Ice chips: Not tested   Thin Liquid Thin Liquid: Within functional limits Presentation: Self Fed;Straw (min assist ) Other Comments: 10 trials    Nectar Thick Nectar Thick Liquid: Not tested   Honey Thick Honey Thick Liquid: Not tested   Puree Puree: Within functional limits Presentation: Self Fed;Spoon (min assist) Other Comments: 8 trials   Solid   GO   Solid: Not tested        Carolynn Sayers, SLP-Graduate Student Carolynn Sayers 08/18/2017,12:17 PM    This information has been reviewed and agreed upon by this supervising clinician.  This patient note, response to treatment and overall treatment plan has been reviewed and this clinician agrees with the information provided.  08/18/17, 3:20 PM [place], [place], CCC-SLP

## 2017-08-18 NOTE — Progress Notes (Signed)
[place] at [facility]                                                                                                                                                                                  Patient Demographics   Tammie Buchanan, is a 76 y.o. female, DOB - [birth year], OXB:[id number]  Admit date - 08/17/2017   Admitting Physician Vaughan Basta, MD  Outpatient Primary MD for the patient is [name], NP   LOS - 1  Subjective: Patient's sugar improved Doing little better daughter at bedside    Review of Systems:   CONSTITUTIONAL: No documented fever. No fatigue, weakness. No weight gain, no weight loss.  EYES: No blurry or double vision.  ENT: No tinnitus. No postnasal drip. No redness of the oropharynx.  RESPIRATORY: No cough, no wheeze, no hemoptysis. No dyspnea.  CARDIOVASCULAR: No chest pain. No orthopnea. No palpitations. No syncope.  GASTROINTESTINAL: No nausea, no vomiting or diarrhea. No abdominal pain. No melena or hematochezia.  GENITOURINARY: No dysuria or hematuria.  ENDOCRINE: No polyuria or nocturia. No heat or cold intolerance.  HEMATOLOGY: No anemia. No bruising. No bleeding.  INTEGUMENTARY: No rashes. No lesions.  MUSCULOSKELETAL: No arthritis. No swelling. No gout.  NEUROLOGIC: No numbness, tingling, or ataxia. No seizure-type activity.  PSYCHIATRIC: No anxiety. No insomnia. No ADD.    Vitals:   Vitals:   08/18/17 0441 08/18/17 0930 08/18/17 1128 08/18/17 1342  BP: (!) 105/44 (!) 100/52 (!) 113/58 (!) 102/40  Pulse: 70 80  78  Resp: 16 17    Temp: 98.1 F (36.7 C) 98.2 F (36.8 C)  98.8 F (37.1 C)  TempSrc: Oral Oral  Oral  SpO2: 92% 94%  93%  Weight:      Height:        Wt Readings from Last 3 Encounters:  08/17/17 144 lb (65.3 kg)  06/03/17 142 lb 11.2 oz (64.7 kg)  05/25/17 141 lb 6 oz (64.1 kg)     Intake/Output Summary (Last 24 hours) at 08/18/17 1546 Last data filed at 08/18/17  1405  Gross per 24 hour  Intake          1656.67 ml  Output              900 ml  Net           756.67 ml    Physical Exam:   GENERAL: Pleasant-appearing in no apparent distress.  HEAD, EYES, EARS, NOSE AND THROAT: Atraumatic, normocephalic. Extraocular muscles are intact. Pupils equal and reactive to light. Sclerae anicteric. No conjunctival injection. No oro-pharyngeal erythema.  NECK: Supple. There is no jugular venous distention. No  bruits, no lymphadenopathy, no thyromegaly.  HEART: Regular rate and rhythm,. No murmurs, no rubs, no clicks.  LUNGS: Clear to auscultation bilaterally. No rales or rhonchi. No wheezes.  ABDOMEN: Soft, flat, nontender, nondistended. Has good bowel sounds. No hepatosplenomegaly appreciated.  EXTREMITIES: No evidence of any cyanosis, clubbing, or peripheral edema.  +2 pedal and radial pulses bilaterally.  NEUROLOGIC: The patient is alert, awake, and oriented x3 with no focal motor or sensory deficits appreciated bilaterally.  SKIN: Moist and warm with no rashes appreciated.  Psych: Not anxious, depressed LN: No inguinal LN enlargement    Antibiotics   Anti-infectives    None      Medications   Scheduled Meds: . amLODipine  10 mg Oral QHS  . aspirin EC  81 mg Oral Daily  . calcium-vitamin D  1 tablet Oral Daily  . carvedilol  50 mg Oral BID WC  . colchicine  0.6 mg Oral BID   And  . probenecid  500 mg Oral BID  . docusate sodium  200 mg Oral QHS  . gabapentin  300 mg Oral TID  . heparin  5,000 Units Subcutaneous Q8H  . [START ON 08/19/2017] Influenza vac split quadrivalent PF  0.5 mL Intramuscular Tomorrow-1000  . insulin aspart  0-9 Units Subcutaneous TID WC  . insulin aspart  5 Units Subcutaneous TID WC  . insulin glargine  10 Units Subcutaneous QHS  . insulin starter kit- pen needles  1 kit Other Once  . levETIRAcetam  500 mg Oral BID  . living well with diabetes book   Does not apply Once  . Melatonin  5 mg Oral QHS  . pantoprazole   40 mg Oral Daily  . [START ON 08/19/2017] pneumococcal 23 valent vaccine  0.5 mL Intramuscular Tomorrow-1000  . potassium chloride  40 mEq Oral Once  . simvastatin  20 mg Oral QPM   Continuous Infusions: . sodium chloride 50 mL/hr at 08/17/17 1840   PRN Meds:.acetaminophen, docusate sodium, ondansetron, prochlorperazine   Data Review:   Micro Results No results found for this or any previous visit (from the past 240 hour(s)).  Radiology Reports Ct Head Wo Contrast  Result Date: 08/17/2017 CLINICAL DATA:  76 year old female with left-sided weakness and confusion over the past 2 days. 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 Initial encounter. EXAM: CT HEAD WITHOUT CONTRAST TECHNIQUE: Contiguous axial images were obtained from the base of the skull through the vertex without intravenous contrast. COMPARISON:  06/01/2017 brain MR. 04/12/2017 head CT. FINDINGS: Brain: Posterior right frontal metastatic lesion not visualized as on prior examinations which may represent response to treatment. Contrast-enhanced MR would be best for assessing possibility of residual or progressive metastatic disease. No intracranial hemorrhage or CT evidence of large acute infarct. Prominent chronic microvascular changes. Atrophy without hydrocephalus. Vascular: Vascular calcifications Skull: Scattered lucencies similar to prior exam. Sinuses/Orbits: No acute orbital abnormality. Almost complete opacification right maxillary sinus as previously noted. Other: Mastoid air cells and middle ear cavities are clear. IMPRESSION: Posterior right frontal metastatic lesion not visualized as on prior examinations which may represent response to treatment. Contrast-enhanced MR would be best for assessing possibility of residual or progressive metastatic disease. No intracranial hemorrhage or CT evidence of large acute infarct. Prominent chronic microvascular  changes. Atrophy without hydrocephalus. Almost complete opacification right maxillary sinus as previously noted. Electronically Signed   By: Genia Del M.D.   On: 08/17/2017 13:07   Dg Chest Portable 1  View  Result Date: 08/17/2017 CLINICAL DATA:  History of lung carcinoma.  Hypertension.  Weakness. EXAM: PORTABLE CHEST 1 VIEW COMPARISON:  Chest radiograph April 12, 2017; PET-CT May 20, 2017 FINDINGS: Port-A-Cath tip is in the superior vena cava near the cavoatrial junction. No pneumothorax. There is patchy bibasilar atelectasis. The lungs elsewhere appear clear. These small nodular opacity in the right upper lobe seen on recent PET study is not appreciable by radiography. Heart size is upper normal with pulmonary vascularity within normal limits. There is aortic atherosclerosis. No adenopathy. No evident bone lesions. IMPRESSION: Bibasilar atelectasis. No edema or consolidation. Stable cardiac prominence. Aortic atherosclerosis present. Aortic Atherosclerosis (ICD10-I70.0). Electronically Signed   By: Lowella Grip III M.D.   On: 08/17/2017 13:40     CBC  Recent Labs Lab 08/17/17 1304 08/18/17 0418  WBC 7.3 7.3  HGB 12.5 12.9  HCT 38.3 38.5  PLT 187 187  MCV 89.9 88.1  MCH 29.4 29.6  MCHC 32.7 33.6  RDW 16.5* 16.1*  LYMPHSABS 1.0  --   MONOABS 0.8  --   EOSABS 0.1  --   BASOSABS 0.0  --     Chemistries   Recent Labs Lab 08/17/17 1304 08/18/17 0418  NA 127* 135  K 4.0 3.3*  CL 88* 100*  CO2 27 28  GLUCOSE 721* 212*  BUN 13 9  CREATININE 1.08* 0.66  CALCIUM 8.0* 7.8*  MG  --  1.2*  AST 15  --   ALT 17  --   ALKPHOS 115  --   BILITOT 0.8  --    ------------------------------------------------------------------------------------------------------------------ estimated creatinine clearance is 50 mL/min (by C-G formula based on SCr of 0.66  mg/dL). ------------------------------------------------------------------------------------------------------------------  Recent Labs  08/17/17 1311  HGBA1C >15.5*   ------------------------------------------------------------------------------------------------------------------ No results for input(s): CHOL, HDL, LDLCALC, TRIG, CHOLHDL, LDLDIRECT in the last 72 hours. ------------------------------------------------------------------------------------------------------------------ No results for input(s): TSH, T4TOTAL, T3FREE, THYROIDAB in the last 72 hours.  Invalid input(s): FREET3 ------------------------------------------------------------------------------------------------------------------ No results for input(s): VITAMINB12, FOLATE, FERRITIN, TIBC, IRON, RETICCTPCT in the last 72 hours.  Coagulation profile  Recent Labs Lab 08/17/17 1304  INR 1.00    No results for input(s): DDIMER in the last 72 hours.  Cardiac Enzymes  Recent Labs Lab 08/17/17 1304  TROPONINI 0.03*   ------------------------------------------------------------------------------------------------------------------ Invalid input(s): POCBNP    Assessment & Plan    IMPRESSION AND PLAN:   * Hyperglycemia Due to Decadrone- according to daughter today was the last day so Decadron since been stopped Hemoglobin A1c greater than 15.5 Patient will need long-acting insulin at home       * Lung mass with brain mets Follows with Dr. Grayland Ormond, had radiation.   * Htn Blood pressure on the low side hold Coreg as needed  * Hyperlipidemia   COnt simvastatin.  * Seizures    Cont home meds      Code Status Orders        Start     Ordered   08/17/17 1757  Do not attempt resuscitation (DNR)  Continuous    Question Answer Comment  In the event of cardiac or respiratory ARREST Do not call a "code blue"   In the event of cardiac or respiratory ARREST Do not perform Intubation, CPR,  defibrillation or ACLS   In the event of cardiac or respiratory ARREST Use medication by any route, position, wound care, and other measures to relive pain and suffering. May use oxygen, suction and manual treatment of airway obstruction as needed for  comfort.      08/17/17 1757    Code Status History    Date Active Date Inactive Code Status Order ID Comments User Context   This patient has a current code status but no historical code status.           Consults  none  DVT Prophylaxis  Lovenox  Lab Results  Component Value Date   PLT 187 08/18/2017     Time Spent in minutes   35 minutes Greater than 50% of time spent in care coordination and counseling patient regarding the condition and plan of care.   Dustin Flock M.D on 08/18/2017 at 3:46 PM  Between 7am to 6pm - Pager - [phone]  After 6pm go to [email or web address] - password [email or web address] [facility]   Office  [phone]

## 2017-08-19 ENCOUNTER — Inpatient Hospital Stay: Payer: Medicare HMO

## 2017-08-19 DIAGNOSIS — I639 Cerebral infarction, unspecified: Secondary | ICD-10-CM

## 2017-08-19 LAB — GLUCOSE, CAPILLARY
GLUCOSE-CAPILLARY: 249 mg/dL — AB (ref 65–99)
GLUCOSE-CAPILLARY: 222 mg/dL — AB (ref 65–99)
Glucose-Capillary: 154 mg/dL — ABNORMAL HIGH (ref 65–99)
Glucose-Capillary: 176 mg/dL — ABNORMAL HIGH (ref 65–99)
Glucose-Capillary: 257 mg/dL — ABNORMAL HIGH (ref 65–99)

## 2017-08-19 MED ORDER — ATORVASTATIN CALCIUM 20 MG PO TABS
40.0000 mg | ORAL_TABLET | Freq: Every day | ORAL | Status: DC
  Administered 2017-08-19 – 2017-08-20 (×2): 40 mg via ORAL
  Filled 2017-08-19 (×2): qty 2

## 2017-08-19 MED ORDER — SODIUM CHLORIDE 0.9% FLUSH: 10.0000 mL | INTRAVENOUS | Status: DC | PRN

## 2017-08-19 MED ORDER — MAGNESIUM SULFATE 4 GM/100ML IV SOLN
4.0000 g | Freq: Once | INTRAVENOUS | Status: AC
  Administered 2017-08-19: 4 g via INTRAVENOUS
  Filled 2017-08-19: qty 100

## 2017-08-19 MED ORDER — INSULIN GLARGINE 100 UNIT/ML ~~LOC~~ SOLN
3.0000 [IU] | Freq: Every day | SUBCUTANEOUS | Status: DC
  Administered 2017-08-19: 22:00:00 3 [IU] via SUBCUTANEOUS
  Filled 2017-08-19 (×2): qty 0.03

## 2017-08-19 NOTE — Progress Notes (Addendum)
Inpatient Diabetes Program Recommendations  AACE/ADA: New Consensus Statement on Inpatient Glycemic Control (2015)  Target Ranges:  Prepandial:   less than 140 mg/dL      Peak postprandial:   less than 180 mg/dL (1-2 hours)      Critically ill patients:  140 - 180 mg/dL  Results for Tammie Buchanan, Tammie Buchanan (MRN [id number]) as of 08/19/2017 07:52  Ref. Range 08/18/2017 07:36 08/18/2017 11:52 08/18/2017 16:48 08/18/2017 16:56 08/18/2017 17:24 08/18/2017 18:05 08/18/2017 19:56 08/19/2017 07:31  Glucose-Capillary Latest Ref Range: 65 - 99 mg/dL 205 (H)  Novolog 8 units 155 (H)  Novolog 7 units 53 (L) 71 65 93 134 (H) 154 (H)   Results for Tammie Buchanan, Tammie Buchanan (MRN [id number]) as of 08/19/2017 07:52  Ref. Range 08/17/2017 13:11  Hemoglobin A1C Latest Ref Range: 4.8 - 5.6 % >15.5 (H)   Review of Glycemic Control  Diabetes history: DM2 Outpatient Diabetes medications: None Current orders for Inpatient glycemic control: Novolog 0-9 units TID with meals, Novolog 5 units TID with meals  Inpatient Diabetes Program Recommendations:  Insulin - Basal: Noted patient experienced hypoglycemia yesterday and no longer ordered Decadron. No Lantus was given last night as order was discontinued. Please consider ordering Lantus 3 units daily. Insulin - Meal Coverage: Please discontinue Novolog 5 units TID with meals. HgbA1C: A1C > 15.5% on 08/18/17 likely due to recent Decadron use as an outpatient.   Thanks, Barnie Alderman, RN, MSN, CDE Diabetes Coordinator Inpatient Diabetes Program [phone] (Team Pager from 8am to 5pm)

## 2017-08-19 NOTE — Progress Notes (Signed)
SLP Cancellation Note  Patient Details Name: Tammie Buchanan MRN: [id number] DOB: March 30, 1941   Cancelled treatment:       Reason Eval/Treat Not Completed: Patient at procedure or test/unavailable (chart reviewed; spoke at length w/ Dtr in room). Pt was out of room for treatment; spoke w/ Dtr and family. Dtr denied any difficulty w/ swallowing at the 2 two meals eaten today and stated pt had more of an appetite today. Dtr stated she appreciated the information given yesterday re: diet/food preparation and options; general aspiration precautions.  ST services will be available while pt is admitted for any further education or needs. NSG updated. Family agreed.   Orinda Kenner, MS, CCC-SLP Watson,Katherine 08/19/2017, 1:28 PM

## 2017-08-19 NOTE — Progress Notes (Signed)
Dr. Posey Pronto stated pt may receive both pneumonia and influenza vaccines today. He also stated to only administer sliding scale insulin at this time, and he will modify her insulin orders.

## 2017-08-19 NOTE — Consult Note (Addendum)
Referring Physician: Posey Pronto    Chief Complaint: Confusion  HPI: Tammie Buchanan is an 76 y.o. female with a history of metastatic lung cancer presenting with hyperglycemia and confusion.  Daughter reports that about 2 weeks ago the patient was found sitting in the bathroom complaining of headache.  Headache resolved but after that time the patient seemed to draughter left leg.  She remained ambulatory though.  Then about one week ago the patient began to be confused.  Awakened intact but after her morning nap would be confused for the remainder of the day.  On Sunday no longer had any portion of the day that she was not confused and seemed less functional.  On Monday contacted oncologist who recommended ED presentation.  Initial NIHSS of 5.    Date last known well: Unable to determine Time last known well: Unable to determine tPA Given: No: Unknown LKW  Past Medical History:  Diagnosis Date  . Brain cancer ([place])   . Diabetes mellitus without complication ([place])   . Hypertension   . Lung cancer [facility])     Past Surgical History:  Procedure Laterality Date  . ABDOMINAL HYSTERECTOMY     partial  . APPENDECTOMY    . PORTA CATH INSERTION N/A 02/04/2017   Procedure: [name];  Surgeon: Algernon Huxley, MD;  Location: [place] [facility];  Service: Cardiovascular;  Laterality: N/A;    Family History  Problem Relation Age of Onset  . Diabetes Mother   . Melanoma Sister   . Ovarian cancer Sister   . Diabetes Sister   . Diabetes Brother   . Diabetes Maternal Grandmother   . Diabetes Paternal Grandmother    Social History:  reports that she has quit smoking. She has never used smokeless tobacco. She reports that she does not drink alcohol or use drugs.  Allergies:  Allergies  Allergen Reactions  . Ace Inhibitors Anaphylaxis  . Allopurinol Anaphylaxis  . Angiotensin Receptor Blockers Anaphylaxis  . Codeine Anaphylaxis  . Enalapril Swelling    Other reaction(s):  SWELLING/EDEMA  . Furosemide Swelling    Other reaction(s): OTHER  . Hydralazine Swelling    Other reaction(s): SWELLING/EDEMA  . Hydrochlorothiazide Anaphylaxis  . Irbesartan Swelling    Other reaction(s): SWELLING/EDEMA  . Levofloxacin Swelling    Other reaction(s): SWELLING/EDEMA  . Lidocaine Swelling    Other reaction(s): SWELLING/EDEMA  . Naproxen Sodium Anaphylaxis  . Spironolactone Anaphylaxis  . Tramadol Anaphylaxis    Other reaction(s): NAUSEA    Medications:  I have reviewed the patient's current medications. Prior to Admission:  Prescriptions Prior to Admission  Medication Sig Dispense Refill Last Dose  . alendronate (FOSAMAX) 70 MG tablet Take 70 mg by mouth every Friday.    Past Week at Unknown time  . amLODipine (NORVASC) 10 MG tablet Take 10 mg by mouth at bedtime.    08/16/2017 at Unknown time  . aspirin EC 81 MG tablet Take 81 mg by mouth daily.    08/17/2017 at Unknown time  . Calcium Carb-Cholecalciferol 600-800 MG-UNIT TABS Take 1 tablet by mouth daily.    08/17/2017 at Unknown time  . carvedilol (COREG) 25 MG tablet Take 50 mg by mouth 2 (two) times daily with a meal.    08/17/2017 at Unknown time  . cetirizine (ZYRTEC) 10 MG tablet Take 10 mg by mouth at bedtime.    08/16/2017 at Unknown time  . colchicine-probenecid 0.5-500 MG tablet Take 1 tablet by mouth 2 (two) times daily.  08/17/2017 at Unknown time  . dexamethasone (DECADRON) 4 MG tablet Take 1 tablet (4 mg total) by mouth daily. 30 tablet 1 08/17/2017 at Unknown time  . docusate sodium (COLACE) 100 MG capsule Take 200-400 mg by mouth at bedtime.    08/16/2017 at Unknown time  . esomeprazole (NEXIUM) 40 MG capsule Take 40 mg by mouth daily.    08/17/2017 at Unknown time  . gabapentin (NEURONTIN) 300 MG capsule Take 300 mg by mouth 3 (three) times daily.    08/17/2017 at Unknown time  . Hypromellose (ARTIFICIAL TEARS OP) Apply 1 drop to eye daily as needed (dry eyes).   prn  . levETIRAcetam (KEPPRA)  500 MG tablet Take 1 tablet (500 mg total) by mouth 2 (two) times daily. 60 tablet 2 08/17/2017 at Unknown time  . Melatonin 10 MG TABS Take 20 mg by mouth at bedtime.   08/16/2017 at Unknown time  . ondansetron (ZOFRAN) 8 MG tablet Take 1 tablet (8 mg total) by mouth 2 (two) times daily as needed for refractory nausea / vomiting. 60 tablet 2 prn  . prochlorperazine (COMPAZINE) 10 MG tablet Take 1 tablet (10 mg total) by mouth every 6 (six) hours as needed (Nausea or vomiting). 60 tablet 2 prn  . simvastatin (ZOCOR) 20 MG tablet Take 20 mg by mouth every evening.    08/16/2017 at Unknown time  . acetaminophen (TYLENOL) 500 MG tablet Take 500 mg by mouth 2 (two) times daily as needed for moderate pain.   Not Taking at Unknown time  . amoxicillin-clavulanate (AUGMENTIN) 875-125 MG tablet Take 1 tablet by mouth 2 (two) times daily. For 7 days. (Patient not taking: Reported on 08/17/2017) 10 tablet 0 Not Taking at Unknown time  . cloNIDine (CATAPRES - DOSED IN MG/24 HR) 0.3 mg/24hr patch Place 0.3 mg onto the skin once a week. Change patch on Wednesdays   Not Taking at Unknown time  . erythromycin ophthalmic ointment Apply to eyelid margins every night   Not Taking at Unknown time   Scheduled: . amLODipine  10 mg Oral QHS  . aspirin EC  81 mg Oral Daily  . atorvastatin  40 mg Oral q1800  . calcium-vitamin D  1 tablet Oral Daily  . carvedilol  50 mg Oral BID WC  . colchicine  0.6 mg Oral BID   And  . probenecid  500 mg Oral BID  . docusate sodium  200 mg Oral QHS  . feeding supplement (ENSURE ENLIVE)  237 mL Oral TID BM  . gabapentin  300 mg Oral TID  . heparin  5,000 Units Subcutaneous Q8H  . Influenza vac split quadrivalent PF  0.5 mL Intramuscular Tomorrow-1000  . insulin aspart  0-9 Units Subcutaneous TID WC  . insulin aspart  5 Units Subcutaneous TID WC  . insulin glargine  3 Units Subcutaneous QHS  . levETIRAcetam  500 mg Oral BID  . Melatonin  5 mg Oral QHS  . pantoprazole  40 mg Oral  Daily  . pneumococcal 23 valent vaccine  0.5 mL Intramuscular Tomorrow-1000  . simvastatin  20 mg Oral QPM    ROS: History obtained from the patient  General ROS: negative for - chills, fatigue, fever, night sweats, weight gain or weight loss Psychological ROS: negative for - behavioral disorder, hallucinations, memory difficulties, mood swings or suicidal ideation Ophthalmic ROS: negative for - blurry vision, double vision, eye pain or loss of vision ENT ROS: negative for - epistaxis, nasal discharge, oral lesions, sore throat,  tinnitus or vertigo Allergy and Immunology ROS: negative for - hives or itchy/watery eyes Hematological and Lymphatic ROS: negative for - bleeding problems, bruising or swollen lymph nodes Endocrine ROS: negative for - galactorrhea, hair pattern changes, polydipsia/polyuria or temperature intolerance Respiratory ROS: negative for - cough, hemoptysis, shortness of breath or wheezing Cardiovascular ROS: negative for - chest pain, dyspnea on exertion, edema or irregular heartbeat Gastrointestinal ROS: negative for - abdominal pain, diarrhea, hematemesis, nausea/vomiting or stool incontinence Genito-Urinary ROS: negative for - dysuria, hematuria, incontinence or urinary frequency/urgency Musculoskeletal ROS: weakness Neurological ROS: as noted in HPI, intermittent headache Dermatological ROS: negative for rash and skin lesion changes  Physical Examination: Blood pressure (!) 114/45, pulse 79, temperature 98.2 F (36.8 C), temperature source Oral, resp. rate 16, height 5\' 4"  (1.626 m), weight 65.3 kg (144 lb), SpO2 92 %.  HEENT-  Normocephalic, no lesions, without obvious abnormality.  Normal external eye and conjunctiva.  Normal TM's bilaterally.  Normal auditory canals and external ears. Normal external nose, mucus membranes and septum.  Normal pharynx. Cardiovascular- S1, S2 normal, pulses palpable throughout   Lungs- chest clear, no wheezing, rales, normal  symmetric air entry Abdomen- soft, non-tender; bowel sounds normal; no masses,  no organomegaly Extremities- mild LE edema Lymph-no adenopathy palpable Musculoskeletal-no joint tenderness, deformity or swelling Skin-multiple areas of bruising and skin tears  Neurological Examination   Mental Status: Lethargic but easily awakened.  Follows simple commands.  Speech fluent.  . Cranial Nerves: II: Discs flat bilaterally; Left HH, pupils equal, round, reactive to light and accommodation III,IV, VI: ptosis not present, extra-ocular motions intact bilaterally V,VII: mild decrease in the right NLF, facial light touch sensation normal bilaterally VIII: hearing normal bilaterally IX,X: gag reflex receddu XI: bilateral shoulder shrug XII: midline tongue extension Motor: Right : Upper extremity   5-/5    Left:     Upper extremity   0/5  Lower extremity   3/5     Lower extremity   3-/5 Tone and bulk:normal tone throughout; no atrophy noted Sensory: Pinprick and light touch decreased in the left upper and lower extremities Deep Tendon Reflexes: 2+ in the upper extremities and absent in the lower extremities Plantars: Right: upgoing   Left: equivocal Cerebellar: Normal finger-to-nose on the right. Weakess in the lower extremities makes testing difficult Gait: not tested due to safety concerns   Laboratory Studies:  Basic Metabolic Panel:  Recent Labs Lab 08/17/17 1304 08/18/17 0418  NA 127* 135  K 4.0 3.3*  CL 88* 100*  CO2 27 28  GLUCOSE 721* 212*  BUN 13 9  CREATININE 1.08* 0.66  CALCIUM 8.0* 7.8*  MG  --  1.2*    Liver Function Tests:  Recent Labs Lab 08/17/17 1304  AST 15  ALT 17  ALKPHOS 115  BILITOT 0.8  PROT 5.2*  ALBUMIN 2.4*   No results for input(s): LIPASE, AMYLASE in the last 168 hours. No results for input(s): AMMONIA in the last 168 hours.  CBC:  Recent Labs Lab 08/17/17 1304 08/18/17 0418  WBC 7.3 7.3  NEUTROABS 5.4  --   HGB 12.5 12.9  HCT  38.3 38.5  MCV 89.9 88.1  PLT 187 187    Cardiac Enzymes:  Recent Labs Lab 08/17/17 1304  TROPONINI 0.03*    BNP: Invalid input(s): POCBNP  CBG:  Recent Labs Lab 08/18/17 1656 08/18/17 1724 08/18/17 1805 08/18/17 1956 08/19/17 0731  GLUCAP 71 65 93 134* 154*    Microbiology: No results found for  this or any previous visit.  Coagulation Studies:  Recent Labs  08/17/17 1304  LABPROT 13.1  INR 1.00    Urinalysis:  Recent Labs Lab 08/17/17 1907  COLORURINE YELLOW*  LABSPEC 1.017  PHURINE 6.0  GLUCOSEU >=500*  HGBUR NEGATIVE  BILIRUBINUR NEGATIVE  KETONESUR NEGATIVE  PROTEINUR NEGATIVE  NITRITE NEGATIVE  LEUKOCYTESUR NEGATIVE    Lipid Panel: No results found for: CHOL, TRIG, HDL, CHOLHDL, VLDL, LDLCALC  HgbA1C:  Lab Results  Component Value Date   HGBA1C >15.5 (H) 08/17/2017    Urine Drug Screen:  No results found for: LABOPIA, COCAINSCRNUR, LABBENZ, AMPHETMU, THCU, LABBARB  Alcohol Level: No results for input(s): ETH in the last 168 hours.  Other results: EKG: normal sinus rhythm at 74 bpm.  Imaging: Ct Head Wo Contrast  Result Date: 08/17/2017 CLINICAL DATA:  76 year old female with left-sided weakness and confusion over the past 2 days. 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 Initial encounter. EXAM: CT HEAD WITHOUT CONTRAST TECHNIQUE: Contiguous axial images were obtained from the base of the skull through the vertex without intravenous contrast. COMPARISON:  06/01/2017 brain MR. 04/12/2017 head CT. FINDINGS: Brain: Posterior right frontal metastatic lesion not visualized as on prior examinations which may represent response to treatment. Contrast-enhanced MR would be best for assessing possibility of residual or progressive metastatic disease. No intracranial hemorrhage or CT evidence of large acute infarct. Prominent chronic microvascular changes. Atrophy without  hydrocephalus. Vascular: Vascular calcifications Skull: Scattered lucencies similar to prior exam. Sinuses/Orbits: No acute orbital abnormality. Almost complete opacification right maxillary sinus as previously noted. Other: Mastoid air cells and middle ear cavities are clear. IMPRESSION: Posterior right frontal metastatic lesion not visualized as on prior examinations which may represent response to treatment. Contrast-enhanced MR would be best for assessing possibility of residual or progressive metastatic disease. No intracranial hemorrhage or CT evidence of large acute infarct. Prominent chronic microvascular changes. Atrophy without hydrocephalus. Almost complete opacification right maxillary sinus as previously noted. Electronically Signed   By: Genia Del M.D.   On: 08/17/2017 13:07   Mr Brain Wo Contrast  Result Date: 08/19/2017 CLINICAL DATA:  Intermittent confusion for a few days, evaluate encephalopathy. 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 EXAM: MRI HEAD WITHOUT CONTRAST TECHNIQUE: Multiplanar, multiecho pulse sequences of the brain and surrounding structures were obtained without intravenous contrast. COMPARISON:  CT HEAD August 17, 2017 and MRI of the head June 01, 2017 FINDINGS: INTRACRANIAL CONTENTS: Confluent RIGHT temporoparietal predominately cortical reduced diffusion with low ADC values and T2 bright signal. Spurs reduced diffusion associated with the RIGHT frontal known metastasis with similar susceptibility artifact. No midline shift or mass effect. Old RIGHT basal ganglia lacunar infarct. Confluent similar supratentorial and patchy pontine white matter FLAIR T2 hyperintensities exclusive of the aforementioned abnormality. Ventricles and sulci are normal for patient's age. No abnormal extra-axial fluid collections. VASCULAR: Normal major intracranial vascular flow voids present at skull base. SKULL AND UPPER CERVICAL SPINE: No  abnormal sellar expansion. No suspicious calvarial bone marrow signal. Craniocervical junction maintained. SINUSES/ORBITS: Chronic RIGHT maxillary sinusitis. RIGHT fronto ethmoidal mucosal thickening. Trace RIGHT mastoid effusion.The included ocular globes and orbital contents are non-suspicious. Status post bilateral ocular lens implants. OTHER: None. IMPRESSION: 1. 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 2. Treated RIGHT frontal lobe metastasis. 3. Moderate to severe chronic small vessel ischemic disease. Old RIGHT basal ganglia lacunar infarct. 4. These results will be called to the ordering  clinician or representative by the Radiologist Assistant, and communication documented in the PACS or zVision Dashboard. Electronically Signed   By: Elon Alas M.D.   On: 08/19/2017 02:25   Dg Chest Portable 1 View  Result Date: 08/17/2017 CLINICAL DATA:  History of lung carcinoma.  Hypertension.  Weakness. EXAM: PORTABLE CHEST 1 VIEW COMPARISON:  Chest radiograph April 12, 2017; PET-CT May 20, 2017 FINDINGS: Port-A-Cath tip is in the superior vena cava near the cavoatrial junction. No pneumothorax. There is patchy bibasilar atelectasis. The lungs elsewhere appear clear. These small nodular opacity in the right upper lobe seen on recent PET study is not appreciable by radiography. Heart size is upper normal with pulmonary vascularity within normal limits. There is aortic atherosclerosis. No adenopathy. No evident bone lesions. IMPRESSION: Bibasilar atelectasis. No edema or consolidation. Stable cardiac prominence. Aortic atherosclerosis present. Aortic Atherosclerosis (ICD10-I70.0). Electronically Signed   By: Lowella Grip III M.D.   On: 08/17/2017 13:40    Assessment: 76 y.o. female with a history of metastatic lung cancer presenting with confusion and hyperglycemia.  MRI of the brain reviewed and shows an acute right MCA territory infarct not seen on head CT from 10/22.   Concern is for embolic etiology.  Patient has been hypotensive recently as well which may have contributed.  Metastatic lesion smaller in size.  Echocardiogram and carotid dopplers are pending.  Patient on ASA at home.  A1c >15.5.    Stroke Risk Factors - diabetes mellitus and hypertension  Plan: 1. Lipid panel pending 2. PT consult, OT consult, Speech consult 3. Echocardiogram pending 4. Carotid dopplers pending 5. Prophylactic therapy-ASA 81mg  and Plavix 75mg  daily 6. NPO until RN stroke swallow screen 7. Telemetry monitoring 8. Frequent neuro checks 9. Agree with blood sugar management 10. Agree with attempts to liberalize blood pressure   Alexis Goodell, MD Neurology [phone] 08/19/2017, 11:09 AM

## 2017-08-19 NOTE — Progress Notes (Signed)
Received a call from [facility] regarding patient's MRI result. Result relayed to MD on call through paging system. No new orders were made. Patient sleeping comfortably, no notable signs of distress. Kept safe and comfortable.

## 2017-08-19 NOTE — Progress Notes (Signed)
[place] at [facility]                                                                                                                                                                                  Patient Demographics   [place], is a 76 y.o. female, DOB - July 23, 1941, VVO:[id number]  Admit date - 08/17/2017   Admitting Physician Vaughan Basta, MD  Outpatient Primary MD for the patient is [name], NP   LOS - 2  Subjective: Patient's sugar improved Hypoglycemia yesterday Patient having difficulty moving left upper extremity    Review of Systems:   CONSTITUTIONAL: No documented fever. pons fatigue, positiveweakness. No weight gain, no weight loss.  EYES: No blurry or double vision.  ENT: No tinnitus. No postnasal drip. No redness of the oropharynx.  RESPIRATORY: No cough, no wheeze, no hemoptysis. No dyspnea.  CARDIOVASCULAR: No chest pain. No orthopnea. No palpitations. No syncope.  GASTROINTESTINAL: No nausea, no vomiting or diarrhea. No abdominal pain. No melena or hematochezia.  GENITOURINARY: No dysuria or hematuria.  ENDOCRINE: No polyuria or nocturia. No heat or cold intolerance.  HEMATOLOGY: No anemia. No bruising. No bleeding.  INTEGUMENTARY: No rashes. No lesions.  MUSCULOSKELETAL: No arthritis. No swelling. No gout.  NEUROLOGIC: No numbness, tingling, or ataxia. No seizure-type activity.  PSYCHIATRIC: No anxiety. No insomnia. No ADD.    Vitals:   Vitals:   08/18/17 1930 08/19/17 0453 08/19/17 0810 08/19/17 1438  BP: (!) 155/64 (!) 120/49 (!) 114/45 (!) 122/56  Pulse: 77 81 79 78  Resp: 15 16 16 18   Temp: 97.9 F (36.6 C) 98.3 F (36.8 C) 98.2 F (36.8 C) 98.4 F (36.9 C)  TempSrc: Oral Oral Oral Oral  SpO2: 92% 92% 92% 92%  Weight:      Height:        Wt Readings from Last 3 Encounters:  08/17/17 144 lb (65.3 kg)  06/03/17 142 lb 11.2 oz (64.7 kg)  05/25/17 141 lb 6 oz (64.1 kg)      Intake/Output Summary (Last 24 hours) at 08/19/17 1502 Last data filed at 08/19/17 1354  Gross per 24 hour  Intake             1870 ml  Output              750 ml  Net             1120 ml    Physical Exam:   GENERAL: Pleasant-appearing in no apparent distress.  HEAD, EYES, EARS, NOSE AND THROAT: Atraumatic, normocephalic. Extraocular muscles are intact. Pupils equal and reactive to light. Sclerae anicteric. No conjunctival injection. No oro-pharyngeal  erythema.  NECK: Supple. There is no jugular venous distention. No bruits, no lymphadenopathy, no thyromegaly.  HEART: Regular rate and rhythm,. No murmurs, no rubs, no clicks.  LUNGS: Clear to auscultation bilaterally. No rales or rhonchi. No wheezes.  ABDOMEN: Soft, flat, nontender, nondistended. Has good bowel sounds. No hepatosplenomegaly appreciated.  EXTREMITIES: No evidence of any cyanosis, clubbing, or peripheral edema.  +2 pedal and radial pulses bilaterally.  NEUROLOGIC: The patient is alert, awake, Left upper extremity strength 4-5 SKIN: Moist and warm with no rashes appreciated.  Psych: Not anxious, depressed LN: No inguinal LN enlargement    Antibiotics   Anti-infectives    None      Medications   Scheduled Meds: . amLODipine  10 mg Oral QHS  . aspirin EC  81 mg Oral Daily  . atorvastatin  40 mg Oral q1800  . calcium-vitamin D  1 tablet Oral Daily  . carvedilol  50 mg Oral BID WC  . colchicine  0.6 mg Oral BID   And  . probenecid  500 mg Oral BID  . docusate sodium  200 mg Oral QHS  . feeding supplement (ENSURE ENLIVE)  237 mL Oral TID BM  . gabapentin  300 mg Oral TID  . heparin  5,000 Units Subcutaneous Q8H  . insulin aspart  0-9 Units Subcutaneous TID WC  . insulin aspart  5 Units Subcutaneous TID WC  . insulin glargine  3 Units Subcutaneous QHS  . levETIRAcetam  500 mg Oral BID  . Melatonin  5 mg Oral QHS  . pantoprazole  40 mg Oral Daily  . simvastatin  20 mg Oral QPM   Continuous  Infusions: . magnesium sulfate 1 - 4 g bolus IVPB 4 g (08/19/17 1448)   PRN Meds:.acetaminophen, docusate sodium, ondansetron, prochlorperazine   Data Review:   Micro Results No results found for this or any previous visit (from the past 240 hour(s)).  Radiology Reports Ct Head Wo Contrast  Result Date: 08/17/2017 CLINICAL DATA:  76 year old female with left-sided weakness and confusion over the past 2 days. 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 Initial encounter. EXAM: CT HEAD WITHOUT CONTRAST TECHNIQUE: Contiguous axial images were obtained from the base of the skull through the vertex without intravenous contrast. COMPARISON:  06/01/2017 brain MR. 04/12/2017 head CT. FINDINGS: Brain: Posterior right frontal metastatic lesion not visualized as on prior examinations which may represent response to treatment. Contrast-enhanced MR would be best for assessing possibility of residual or progressive metastatic disease. No intracranial hemorrhage or CT evidence of large acute infarct. Prominent chronic microvascular changes. Atrophy without hydrocephalus. Vascular: Vascular calcifications Skull: Scattered lucencies similar to prior exam. Sinuses/Orbits: No acute orbital abnormality. Almost complete opacification right maxillary sinus as previously noted. Other: Mastoid air cells and middle ear cavities are clear. IMPRESSION: Posterior right frontal metastatic lesion not visualized as on prior examinations which may represent response to treatment. Contrast-enhanced MR would be best for assessing possibility of residual or progressive metastatic disease. No intracranial hemorrhage or CT evidence of large acute infarct. Prominent chronic microvascular changes. Atrophy without hydrocephalus. Almost complete opacification right maxillary sinus as previously noted. Electronically Signed   By: Genia Del M.D.   On: 08/17/2017 13:07    Mr Jodene Nam Head Wo Contrast  Result Date: 08/19/2017 CLINICAL DATA:  Acute posterior right MCA infarct on MRI. Encephalopathy. History of lung cancer with brain metastases. EXAM: MRA HEAD WITHOUT CONTRAST TECHNIQUE: Angiographic images of the [place] were obtained using MRA technique without intravenous contrast. COMPARISON:  Brain MRI 08/19/2017.  No prior angiographic imaging. FINDINGS: The visualized distal vertebral arteries are patent to the basilar with the right being strongly dominant. Patent right PICA and bilateral SCA origins are identified. The left PICA origin was not imaged. The basilar artery is widely patent. There is a fetal origin of the left PCA. Both PCAs are patent without evidence of significant stenosis. The internal carotid arteries are widely patent from skullbase to carotid termini. The MCAs are patent without significant M1 stenosis. 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 Proximal right M2 branches are patent, however there is moderate attenuation of both superior and inferior M2 divisions on the right with a slightly decreased number of more distal branch vessels compared to the left. ACAs are patent without evidence of significant stenosis. IMPRESSION: 1. Right MCA branch vessel attenuation without proximal occlusion or significant proximal stenosis. 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 Electronically Signed   By: Logan Bores M.D.   On: 08/19/2017 13:03   Mr Brain Wo Contrast  Result Date: 08/19/2017 CLINICAL DATA:  Intermittent confusion for a few days, evaluate encephalopathy. 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 EXAM: MRI HEAD WITHOUT CONTRAST TECHNIQUE: Multiplanar, multiecho pulse sequences of the brain and surrounding structures were obtained without intravenous contrast. COMPARISON:  CT HEAD August 17, 2017 and MRI of the head June 01, 2017 FINDINGS: INTRACRANIAL  CONTENTS: Confluent RIGHT temporoparietal predominately cortical reduced diffusion with low ADC values and T2 bright signal. Spurs reduced diffusion associated with the RIGHT frontal known metastasis with similar susceptibility artifact. No midline shift or mass effect. Old RIGHT basal ganglia lacunar infarct. Confluent similar supratentorial and patchy pontine white matter FLAIR T2 hyperintensities exclusive of the aforementioned abnormality. Ventricles and sulci are normal for patient's age. No abnormal extra-axial fluid collections. VASCULAR: Normal major intracranial vascular flow voids present at skull base. SKULL AND UPPER CERVICAL SPINE: No abnormal sellar expansion. No suspicious calvarial bone marrow signal. Craniocervical junction maintained. SINUSES/ORBITS: Chronic RIGHT maxillary sinusitis. RIGHT fronto ethmoidal mucosal thickening. Trace RIGHT mastoid effusion.The included ocular globes and orbital contents are non-suspicious. Status post bilateral ocular lens implants. OTHER: None. IMPRESSION: 1. 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 2. Treated RIGHT frontal lobe metastasis. 3. Moderate to severe chronic small vessel ischemic disease. Old RIGHT basal ganglia lacunar infarct. 4. These results will be called to the ordering clinician or representative by the Radiologist Assistant, and communication documented in the PACS or zVision Dashboard. Electronically Signed   By: Elon Alas M.D.   On: 08/19/2017 02:25   US Carotid Bilateral  Result Date: 08/19/2017 CLINICAL DATA:  Stroke. Confusion. Weakness. History of hypertension, hyperlipidemia and diabetes. Former smoker. EXAM: BILATERAL CAROTID DUPLEX ULTRASOUND TECHNIQUE: Pearline Cables scale imaging, color Doppler and duplex ultrasound were performed of bilateral carotid and vertebral arteries in the neck. COMPARISON:  None. FINDINGS: Criteria: Quantification of carotid stenosis is based on velocity parameters that  correlate the residual internal carotid diameter with NASCET-based stenosis levels, using the diameter of the distal internal carotid lumen as the denominator for stenosis measurement. The following velocity measurements were obtained: RIGHT ICA:  179/39 cm/sec CCA:  In 149/70 cm/sec SYSTOLIC ICA/CCA RATIO:  1.5 DIASTOLIC ICA/CCA RATIO:  2.2 ECA:  127 cm/sec LEFT ICA:  198/39 cm/sec CCA:  26/37 cm/sec SYSTOLIC ICA/CCA RATIO:  2.1 DIASTOLIC ICA/CCA RATIO:  2.5 ECA:  136 cm/sec RIGHT CAROTID ARTERY: There is a moderate amount of atherosclerotic plaque seen throughout the right common carotid artery (images 4, 8 and 12). There is a moderate to large amount of echogenic partially shadowing plaque within the right carotid bulb (image 16), extending to involve the origin and proximal aspects of the right internal carotid artery (image 24), resulting in elevated peak systolic velocities within the mid aspect of the right internal carotid artery (greatest acquired peak systolic velocity within mid ICA measures 179 cm/sec. RIGHT VERTEBRAL ARTERY:  Antegrade flow LEFT CAROTID ARTERY: There is a minimal amount of atherosclerotic plaque within the mid and distal aspects of the left common carotid artery (image 40 and 44). There is a moderate to large amount of atherosclerotic plaque within the left carotid bulb extending to involve the origin and proximal aspects of the left internal carotid artery (images 48 and 56), resulting in elevated peak systolic velocities within the proximal and mid aspects of the left internal carotid artery (greatest acquired peak systolic velocity within mid ICA measures 198 cm/sec - image 62). LEFT VERTEBRAL ARTERY:  Antegrade flow IMPRESSION: Moderate to large amount of bilateral atherosclerotic plaque results in elevated peak systolic velocities within the bilateral internal carotid arteries compatible with the 50 to 69% luminal narrowing range bilaterally. Further evaluation with CTA could  performed as clinically indicated. Electronically Signed   By: Sandi Mariscal M.D.   On: 08/19/2017 14:53   Dg Chest Portable 1 View  Result Date: 08/17/2017 CLINICAL DATA:  History of lung carcinoma.  Hypertension.  Weakness. EXAM: PORTABLE CHEST 1 VIEW COMPARISON:  Chest radiograph April 12, 2017; PET-CT May 20, 2017 FINDINGS: Port-A-Cath tip is in the superior vena cava near the cavoatrial junction. No pneumothorax. There is patchy bibasilar atelectasis. The lungs elsewhere appear clear. These small nodular opacity in the right upper lobe seen on recent PET study is not appreciable by radiography. Heart size is upper normal with pulmonary vascularity within normal limits. There is aortic atherosclerosis. No adenopathy. No evident bone lesions. IMPRESSION: Bibasilar atelectasis. No edema or consolidation. Stable cardiac prominence. Aortic atherosclerosis present. Aortic Atherosclerosis (ICD10-I70.0). Electronically Signed   By: Lowella Grip III M.D.   On: 08/17/2017 13:40     CBC  Recent Labs Lab 08/17/17 1304 08/18/17 0418  WBC 7.3 7.3  HGB 12.5 12.9  HCT 38.3 38.5  PLT 187 187  MCV 89.9 88.1  MCH 29.4 29.6  MCHC 32.7 33.6  RDW 16.5* 16.1*  LYMPHSABS 1.0  --   MONOABS 0.8  --   EOSABS 0.1  --   BASOSABS 0.0  --     Chemistries   Recent Labs Lab 08/17/17 1304 08/18/17 0418  NA 127* 135  K 4.0 3.3*  CL 88* 100*  CO2 27 28  GLUCOSE 721* 212*  BUN 13 9  CREATININE 1.08* 0.66  CALCIUM 8.0* 7.8*  MG  --  1.2*  AST 15  --   ALT 17  --   ALKPHOS 115  --   BILITOT 0.8  --    ------------------------------------------------------------------------------------------------------------------ estimated creatinine clearance is 50 mL/min (by C-G formula based on SCr of 0.66 mg/dL). ------------------------------------------------------------------------------------------------------------------  Recent Labs  08/17/17 1311  HGBA1C >15.5*    ------------------------------------------------------------------------------------------------------------------ No results for input(s): CHOL, HDL, LDLCALC, TRIG, CHOLHDL, LDLDIRECT in the last 72 hours. ------------------------------------------------------------------------------------------------------------------ No results for input(s): TSH, T4TOTAL, T3FREE, THYROIDAB in the last 72 hours.  Invalid input(s): FREET3 ------------------------------------------------------------------------------------------------------------------ No results for input(s): VITAMINB12, FOLATE, FERRITIN, TIBC, IRON, RETICCTPCT  in the last 72 hours.  Coagulation profile  Recent Labs Lab 08/17/17 1304  INR 1.00    No results for input(s): DDIMER in the last 72 hours.  Cardiac Enzymes  Recent Labs Lab 08/17/17 1304  TROPONINI 0.03*   ------------------------------------------------------------------------------------------------------------------ Invalid input(s): POCBNP    Assessment & Plan    IMPRESSION AND PLAN: *acute CVA We will obtain MRA of the brain Carotid Dopplers Echocardiogram of the heart Aspirin High-dose statin Neurology consult    * Hyperglycemia Due to [facility]- which was stopped yesterday Hemoglobin A1c greater than 15.5 Today we will start on a low-dose Lantus May need pre-meal insulin      * Lung mass with brain mets Follows with Dr. Grayland Ormond, had radiation.   * Htn Coreg resumed  * Hyperlipidemia   COnt simvastatin.  * Seizures    Cont home meds      Code Status Orders        Start     Ordered   08/17/17 1757  Do not attempt resuscitation (DNR)  Continuous    Question Answer Comment  In the event of cardiac or respiratory ARREST Do not call a "code blue"   In the event of cardiac or respiratory ARREST Do not perform Intubation, CPR, defibrillation or ACLS   In the event of cardiac or respiratory ARREST Use medication by any route,  position, wound care, and other measures to relive pain and suffering. May use oxygen, suction and manual treatment of airway obstruction as needed for comfort.      08/17/17 1757    Code Status History    Date Active Date Inactive Code Status Order ID Comments User Context   This patient has a current code status but no historical code status.           Consults  none  DVT Prophylaxis  Lovenox  Lab Results  Component Value Date   PLT 187 08/18/2017     Time Spent in minutes   32 minutes Greater than 50% of time spent in care coordination and counseling patient regarding the condition and plan of care.   Dustin Flock M.D on 08/19/2017 at 3:02 PM  Between 7am to 6pm - Pager - [phone]  After 6pm go to [email or web address] - password [email or web address] [facility]   Office  [phone]

## 2017-08-20 ENCOUNTER — Inpatient Hospital Stay
Admit: 2017-08-20 | Discharge: 2017-08-20 | Disposition: A | Discharge status: Home / Self Care | Payer: Medicare HMO | Attending: Internal Medicine | Admitting: Internal Medicine

## 2017-08-20 LAB — BASIC METABOLIC PANEL
Anion gap: 8 (ref 5–15)
BUN: 15 mg/dL (ref 6–20)
CHLORIDE: 99 mmol/L — AB (ref 101–111)
CO2: 27 mmol/L (ref 22–32)
CREATININE: 0.93 mg/dL (ref 0.44–1.00)
Calcium: 8.3 mg/dL — ABNORMAL LOW (ref 8.9–10.3)
GFR calc non Af Amer: 57 mL/min — ABNORMAL LOW (ref >60)
Glucose, Bld: 286 mg/dL — ABNORMAL HIGH (ref 65–99)
POTASSIUM: 4 mmol/L (ref 3.5–5.1)
Sodium: 134 mmol/L — ABNORMAL LOW (ref 135–145)

## 2017-08-20 LAB — MAGNESIUM: Magnesium: 1.8 mg/dL (ref 1.7–2.4)

## 2017-08-20 LAB — LIPID PANEL
CHOL/HDL RATIO: 5.4 ratio
CHOLESTEROL: 146 mg/dL (ref 0–200)
HDL: 27 mg/dL — AB (ref >40)
LDL Cholesterol: 88 mg/dL (ref 0–99)
Triglycerides: 154 mg/dL — ABNORMAL HIGH (ref <150)
VLDL: 31 mg/dL (ref 0–40)

## 2017-08-20 LAB — GLUCOSE, CAPILLARY
GLUCOSE-CAPILLARY: 193 mg/dL — AB (ref 65–99)
GLUCOSE-CAPILLARY: 199 mg/dL — AB (ref 65–99)
Glucose-Capillary: 237 mg/dL — ABNORMAL HIGH (ref 65–99)
Glucose-Capillary: 267 mg/dL — ABNORMAL HIGH (ref 65–99)

## 2017-08-20 MED ORDER — INSULIN GLARGINE 100 UNIT/ML ~~LOC~~ SOLN
6.0000 [IU] | Freq: Every day | SUBCUTANEOUS | Status: DC
  Administered 2017-08-20: 21:00:00 6 [IU] via SUBCUTANEOUS
  Filled 2017-08-20 (×2): qty 0.06

## 2017-08-20 NOTE — Progress Notes (Signed)
Subjective: Patient stable.  No new neurological complaints.  Has been performing transfers in the room with her daughter.    Objective: Current vital signs: BP (!) 120/44 (BP Location: Right Arm)   Pulse 75   Temp 97.8 F (36.6 C) (Oral)   Resp (!) 24   Ht 5\' 4"  (1.626 m)   Wt 65.3 kg (144 lb)   SpO2 (!) 88%   BMI 24.72 kg/m  Vital signs in last 24 hours: Temp:  [97.8 F (36.6 C)-98.9 F (37.2 C)] 97.8 F (36.6 C) (10/25 0829) Pulse Rate:  [75-79] 75 (10/25 0829) Resp:  [15-24] 24 (10/25 0829) BP: (104-122)/(42-56) 120/44 (10/25 0829) SpO2:  [88 %-99 %] 88 % (10/25 0829)  Intake/Output from previous day: 10/24 0701 - 10/25 0700 In: 1670 [P.O.:120; I.V.:1450; IV Piggyback:100] Out: 1850 [Urine:1850] Intake/Output this shift: Total I/O In: 240 [P.O.:240] Out: -  Nutritional status: DIET DYS 3 Room service appropriate? Yes with Assist; Fluid consistency: Thin  Neurologic Exam: Mental Status: Lethargic but easily awakened.  Follows simple commands.  Speech fluent.  Left neglect.  Cranial Nerves: II: Discs flat bilaterally; Left HH, pupils equal, round, reactive to light and accommodation III,IV, VI: mild left ptosis, extra-ocular motions intact bilaterally V,VII: mild decrease in the right NLF, facial light touch sensation normal bilaterally VIII: hearing normal bilaterally IX,X: gag reflex intact XI: bilateral shoulder shrug XII: midline tongue extension Motor: Right :  Upper extremity   5-/5                                     Left:     Upper extremity   0/5             Lower extremity   3/5                                                  Lower extremity   3-/5 Tone and bulk:normal tone throughout; no atrophy noted Sensory: Pinprick and light touch decreased in the left upper and lower extremities   Lab Results: Basic Metabolic Panel:  Recent Labs Lab 08/17/17 1304 08/18/17 0418 08/20/17 0535  NA 127* 135 134*  K 4.0 3.3* 4.0  CL 88* 100* 99*  CO2 27 28  27   GLUCOSE 721* 212* 286*  BUN 13 9 15   CREATININE 1.08* 0.66 0.93  CALCIUM 8.0* 7.8* 8.3*  MG  --  1.2* 1.8    Liver Function Tests:  Recent Labs Lab 08/17/17 1304  AST 15  ALT 17  ALKPHOS 115  BILITOT 0.8  PROT 5.2*  ALBUMIN 2.4*   No results for input(s): LIPASE, AMYLASE in the last 168 hours. No results for input(s): AMMONIA in the last 168 hours.  CBC:  Recent Labs Lab 08/17/17 1304 08/18/17 0418  WBC 7.3 7.3  NEUTROABS 5.4  --   HGB 12.5 12.9  HCT 38.3 38.5  MCV 89.9 88.1  PLT 187 187    Cardiac Enzymes:  Recent Labs Lab 08/17/17 1304  TROPONINI 0.03*    Lipid Panel:  Recent Labs Lab 08/20/17 0535  CHOL 146  TRIG 154*  HDL 27*  CHOLHDL 5.4  VLDL 31  LDLCALC 88    CBG:  Recent Labs Lab 08/19/17 1130 08/19/17 1439 08/19/17 1637 08/19/17  2112 08/20/17 0747  GLUCAP 176* 257* 249* 222* 237*    Microbiology: No results found for this or any previous visit.  Coagulation Studies:  Recent Labs  08/17/17 1304  LABPROT 13.1  INR 1.00    Imaging: Mr Virgel Paling VF Contrast  Result Date: 08/19/2017 CLINICAL DATA:  Acute posterior right MCA infarct on MRI. Encephalopathy. History of lung cancer with brain metastases. EXAM: MRA HEAD WITHOUT CONTRAST TECHNIQUE: Angiographic images of the Circle of Willis were obtained using MRA technique without intravenous contrast. COMPARISON:  Brain MRI 08/19/2017.  No prior angiographic imaging. FINDINGS: The visualized distal vertebral arteries are patent to the basilar with the right being strongly dominant. Patent right PICA and bilateral SCA origins are identified. The left PICA origin was not imaged. The basilar artery is widely patent. There is a fetal origin of the left PCA. Both PCAs are patent without evidence of significant stenosis. The internal carotid arteries are widely patent from skullbase to carotid termini. The MCAs are patent without significant M1 stenosis. There is a 4 x 3 mm aneurysm  projecting laterally from the right MCA bifurcation. Proximal right M2 branches are patent, however there is moderate attenuation of both superior and inferior M2 divisions on the right with a slightly decreased number of more distal branch vessels compared to the left. ACAs are patent without evidence of significant stenosis. IMPRESSION: 1. Right MCA branch vessel attenuation without proximal occlusion or significant proximal stenosis. 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 Electronically Signed   By: Logan Bores M.D.   On: 08/19/2017 13:03   Mr Brain Wo Contrast  Result Date: 08/19/2017 CLINICAL DATA:  Intermittent confusion for a few days, evaluate encephalopathy. 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 EXAM: MRI HEAD WITHOUT CONTRAST TECHNIQUE: Multiplanar, multiecho pulse sequences of the brain and surrounding structures were obtained without intravenous contrast. COMPARISON:  CT HEAD August 17, 2017 and MRI of the head June 01, 2017 FINDINGS: INTRACRANIAL CONTENTS: Confluent RIGHT temporoparietal predominately cortical reduced diffusion with low ADC values and T2 bright signal. Spurs reduced diffusion associated with the RIGHT frontal known metastasis with similar susceptibility artifact. No midline shift or mass effect. Old RIGHT basal ganglia lacunar infarct. Confluent similar supratentorial and patchy pontine white matter FLAIR T2 hyperintensities exclusive of the aforementioned abnormality. Ventricles and sulci are normal for patient's age. No abnormal extra-axial fluid collections. VASCULAR: Normal major intracranial vascular flow voids present at skull base. SKULL AND UPPER CERVICAL SPINE: No abnormal sellar expansion. No suspicious calvarial bone marrow signal. Craniocervical junction maintained. SINUSES/ORBITS: Chronic RIGHT maxillary sinusitis. RIGHT fronto ethmoidal mucosal thickening. Trace RIGHT mastoid effusion.The included ocular globes and  orbital contents are non-suspicious. Status post bilateral ocular lens implants. OTHER: None. IMPRESSION: 1. 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 2. Treated RIGHT frontal lobe metastasis. 3. Moderate to severe chronic small vessel ischemic disease. Old RIGHT basal ganglia lacunar infarct. 4. These results will be called to the ordering clinician or representative by the Radiologist Assistant, and communication documented in the PACS or zVision Dashboard. Electronically Signed   By: Elon Alas M.D.   On: 08/19/2017 02:25   US Carotid Bilateral  Result Date: 08/19/2017 CLINICAL DATA:  Stroke. Confusion. Weakness. History of hypertension, hyperlipidemia and diabetes. Former smoker. EXAM: BILATERAL CAROTID DUPLEX ULTRASOUND TECHNIQUE: Pearline Cables scale imaging, color Doppler and duplex ultrasound were performed of bilateral carotid and vertebral arteries in the neck. COMPARISON:  None. FINDINGS: Criteria: Quantification of carotid stenosis is  based on velocity parameters that correlate the residual internal carotid diameter with NASCET-based stenosis levels, using the diameter of the distal internal carotid lumen as the denominator for stenosis measurement. The following velocity measurements were obtained: RIGHT ICA:  179/39 cm/sec CCA:  In 196/22 cm/sec SYSTOLIC ICA/CCA RATIO:  1.5 DIASTOLIC ICA/CCA RATIO:  2.2 ECA:  127 cm/sec LEFT ICA:  198/39 cm/sec CCA:  29/79 cm/sec SYSTOLIC ICA/CCA RATIO:  2.1 DIASTOLIC ICA/CCA RATIO:  2.5 ECA:  136 cm/sec RIGHT CAROTID ARTERY: There is a moderate amount of atherosclerotic plaque seen throughout the right common carotid artery (images 4, 8 and 12). There is a moderate to large amount of echogenic partially shadowing plaque within the right carotid bulb (image 16), extending to involve the origin and proximal aspects of the right internal carotid artery (image 24), resulting in elevated peak systolic velocities within the mid aspect of  the right internal carotid artery (greatest acquired peak systolic velocity within mid ICA measures 179 cm/sec. RIGHT VERTEBRAL ARTERY:  Antegrade flow LEFT CAROTID ARTERY: There is a minimal amount of atherosclerotic plaque within the mid and distal aspects of the left common carotid artery (image 40 and 44). There is a moderate to large amount of atherosclerotic plaque within the left carotid bulb extending to involve the origin and proximal aspects of the left internal carotid artery (images 48 and 56), resulting in elevated peak systolic velocities within the proximal and mid aspects of the left internal carotid artery (greatest acquired peak systolic velocity within mid ICA measures 198 cm/sec - image 62). LEFT VERTEBRAL ARTERY:  Antegrade flow IMPRESSION: Moderate to large amount of bilateral atherosclerotic plaque results in elevated peak systolic velocities within the bilateral internal carotid arteries compatible with the 50 to 69% luminal narrowing range bilaterally. Further evaluation with CTA could performed as clinically indicated. Electronically Signed   By: Sandi Mariscal M.D.   On: 08/19/2017 14:53    Medications:  I have reviewed the patient's current medications. Scheduled: . amLODipine  10 mg Oral QHS  . aspirin EC  81 mg Oral Daily  . atorvastatin  40 mg Oral q1800  . calcium-vitamin D  1 tablet Oral Daily  . carvedilol  50 mg Oral BID WC  . colchicine  0.6 mg Oral BID   And  . probenecid  500 mg Oral BID  . docusate sodium  200 mg Oral QHS  . feeding supplement (ENSURE ENLIVE)  237 mL Oral TID BM  . gabapentin  300 mg Oral TID  . heparin  5,000 Units Subcutaneous Q8H  . insulin aspart  0-9 Units Subcutaneous TID WC  . insulin aspart  5 Units Subcutaneous TID WC  . insulin glargine  6 Units Subcutaneous QHS  . levETIRAcetam  500 mg Oral BID  . Melatonin  5 mg Oral QHS  . pantoprazole  40 mg Oral Daily    Assessment/Plan: Patient with metastatic lung cancer now with acute  right MCA infarct.  On ASA and Plavix.  LDL 88.  Carotid dopplers show bilateral carotid stenosis estimated at 50-69%.  Echocardiogram pending.    Recommendations: 1.  No further imaging indicated for carotids due to patient's multiple co-morbidities.  This was discussed with daughter who agrees. 2.  Continue ASA and Plavix 3.  Aggressive lipid management with target LDL<70.     LOS: 3 days   Alexis Goodell, MD Neurology [phone] 08/20/2017  10:49 AM

## 2017-08-20 NOTE — Progress Notes (Signed)
Chaplain received a page for a pt in [facility] who wanted to complete Advanced directives. [place] met pt and daughter at bedside. Contacted a Insurance account manager, gathered 2 witnessed and had pt complete AD. CH gave original and a copy to pt and placed a copy in pt's chart.   08/20/17 1600  Clinical Encounter Type  Visited With Patient;Patient and family together  Visit Type Follow-up  Referral From Nurse  Consult/Referral To Chaplain  Spiritual Encounters  Spiritual Needs Literature  Stress Factors  Patient Stress Factors Exhausted  Family Stress Factors Health changes  Advance Directives (For Healthcare)  Does Patient Have a Medical Advance Directive? Yes  Does patient want to make changes to medical advance directive? Yes (Inpatient - patient requests chaplain consult to change a medical advance directive)  Type of Advance Directive [place];Living will  Copy of [place] in Chart? Yes  Copy of Living Will in Chart? Yes  Would patient like information on creating a medical advance directive? Yes (Inpatient - patient requests chaplain consult to create a medical advance directive)  [place] Directives  Does Patient Have a Mental Health Advance Directive? No  Would patient like information on creating a mental health advance directive? No - Patient declined

## 2017-08-20 NOTE — Progress Notes (Signed)
*  PRELIMINARY RESULTS* Echocardiogram 2D Echocardiogram has been performed.  Sherrie Sport 08/20/2017, 3:23 PM

## 2017-08-20 NOTE — Progress Notes (Signed)
Physical Therapy Treatment Patient Details Name: Tammie Buchanan MRN: [id number] DOB: [birth year] Today's Date: 08/20/2017    History of Present Illness Pt is a 76 y.o. 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 Presented to ED on 08/17/17 with increased confusion over the last 4-5 days and hyperglycemia. Pt is currently receiving chemotherapy for her cancer. 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    PT Comments    Pt found in bed asleep; woken with verbal and tactile cues, but kept falling back asleep during the first 15 minutes of the session. Pt able to perform bed exercises with min vc's for staying on task without falling back asleep. Pt is max assist x1 for bed mobility, seated balance, transfers and gait; second person min guard also provided for safety; heavy vc's required for sequencing. Pt exhibits LUE extinction upon sensation testing and a few inconsistencies with LE sensation testing (pt reports L foot being touched when PT was touching R foot). No additional neurological deficits noted since eval. Following recent dx of R CVA, PT frequency updated. Pt's overall mobility status improving; POC and d/c recommendations remain appropriate. Next session, plan to progress exercises and mobility tasks as appropriate.  Follow Up Recommendations  SNF     Equipment Recommendations  Rolling walker with 5" wheels    Recommendations for Other Services OT consult     Precautions / Restrictions Precautions Precautions: Fall Restrictions Weight Bearing Restrictions: No    Mobility  Bed Mobility Overal bed mobility: Needs Assistance Bed Mobility: Supine to Sit     Supine to sit: Mod assist;Max assist;HOB elevated     General bed mobility comments: mod-max assist for LE movement, trunk elevation with max vc's for sequencing and  encouragement  Transfers Overall transfer level: Needs assistance Equipment used: None Transfers: Sit to/from Stand Sit to Stand: Max assist         General transfer comment: Max assist x1 for sit to/from stand; second person min guard provided for safety; heavy vc's for sequencing. Again, pt more responsive to daughter's cueing  Ambulation/Gait Ambulation/Gait assistance: Max assist;Min guard Ambulation Distance (Feet): 4 Feet Assistive device: Rolling walker (2 wheeled)       General Gait Details: Max assist x1 for balance support and gait assist to chair; second person min guard provided for safety; heavy vc's for sequencing and encouragement; very short steps, min to no foot clearance L>R   Stairs            Wheelchair Mobility    Modified Rankin (Stroke Patients Only)       Balance Overall balance assessment: Needs assistance Sitting-balance support: Feet supported;Single extremity supported Sitting balance-Leahy Scale: Zero Sitting balance - Comments: mod-max assist for maintaining seated balance without falling posterior-laterally to L side, vc's for sitting up tall Postural control: Left lateral lean   Standing balance-Leahy Scale: Zero Standing balance comment: mod-max assist to maintain static and dynamic standing balance with vc's for standing upright and extending legs                            Cognition Arousal/Alertness: Lethargic Behavior During Therapy: Flat affect Overall Cognitive Status: Impaired/Different from baseline                                 General Comments:  Pt asleep when PT entered room; awoken with verbal and tactile cues, fell bask asleep several times initially, but remained awake for mobility tasks; overall pleasant when awake      Exercises Total Joint Exercises Ankle Circles/Pumps: AROM;Both;10 reps;Supine Quad Sets: AROM;Strengthening;Both;10 reps;Supine Heel Slides: Both;AROM;10 reps;Supine  (PT supported LE for friction protection) Hip ABduction/ADduction: AROM;Both;10 reps;Supine (PT supported LE for friction protection)    General Comments General comments (skin integrity, edema, etc.): sensation testing: LUE extinction; inconsistencies with LE testing, pt reports PT touching L foot when R foot is touched, otherwise NL; LE tone WFL bilaterally      Pertinent Vitals/Pain Pain Assessment: No/denies pain    Home Living                      Prior Function            PT Goals (current goals can now be found in the care plan section) Acute Rehab PT Goals Patient Stated Goal: to go home PT Goal Formulation: With patient Time For Goal Achievement: 09/01/17 Potential to Achieve Goals: Fair Progress towards PT goals: Progressing toward goals    Frequency    7X/week      PT Plan Frequency needs to be updated    Co-evaluation              AM-PAC PT "6 Clicks" Daily Activity  Outcome Measure  Difficulty turning over in bed (including adjusting bedclothes, sheets and blankets)?: Unable Difficulty moving from lying on back to sitting on the side of the bed? : Unable Difficulty sitting down on and standing up from a chair with arms (e.g., wheelchair, bedside commode, etc,.)?: Unable Help needed moving to and from a bed to chair (including a wheelchair)?: Total Help needed walking in hospital room?: Total Help needed climbing 3-5 steps with a railing? : Total 6 Click Score: 6    End of Session Equipment Utilized During Treatment: Gait belt Activity Tolerance: Patient tolerated treatment well Patient left: in chair;with call bell/phone within reach;with chair alarm set;with family/visitor present Nurse Communication: Mobility status PT Visit Diagnosis: Unsteadiness on feet (R26.81);Other abnormalities of gait and mobility (R26.89);Muscle weakness (generalized) (M62.81);History of falling (Z91.81)     Time: 1275-1700 PT Time Calculation (min)  (ACUTE ONLY): 34 min  Charges:                       G CodesWetzel Bjornstad, SPT 08/20/2017, 11:50 AM

## 2017-08-20 NOTE — Progress Notes (Signed)
Medications administered by student RN [id number] with supervision of Clinical Instructor Analissa Bayless MSN, RN-BC or patient's assigned RN.   

## 2017-08-20 NOTE — Clinical Social Work Note (Signed)
Patient's daughter has changed her mind and now wants nursing home placement for her mother. Her mother is agreeable. CSW spoke with patient and her daughter on and off throughout the day today. CSW presented the bed offers for patient and patient's daughter was not pleased with any of them. She as a last resort stated she would choose [facility] but she was going to make a few phone calls to see what her friend who works in hospice would recommend. Patient's daughter wanted time to think about things. CSW returned about an hour later and she still had not decided. Patient's daughter called CSW about an hour later and stated that her friend had  told her to not send her mother to any of the facilities that offered and to only send her mother to [facility], [facility], or [facility]. CSW offered to call [facility] to have them re-look at the referral. CSW spent time with Magda Paganini at [facility] discussing patient's referral and they reconsidered and are now willing to take patient. They are beginning the authorization process with the insurance this afternoon. Magda Paganini at [facility] stated that they may consider an LOG but will wait until 12 tomorrow to make that decision if they do not have auth by then. CSW has updated patient's daughter and updated the physician. Shela Leff MSW,LCSW [phone]

## 2017-08-20 NOTE — Progress Notes (Signed)
Inpatient Diabetes Program Recommendations  AACE/ADA: New Consensus Statement on Inpatient Glycemic Control (2015)  Target Ranges:  Prepandial:   less than 140 mg/dL      Peak postprandial:   less than 180 mg/dL (1-2 hours)      Critically ill patients:  140 - 180 mg/dL   Lab Results  Component Value Date   GLUCAP 237 (H) 08/20/2017   HGBA1C >15.5 (H) 08/17/2017    Review of Glycemic Control  Results for MELISSE, CAETANO (MRN [id number]) as of 08/20/2017 09:25  Ref. Range 08/19/2017 11:30 08/19/2017 14:39 08/19/2017 16:37 08/19/2017 21:12 08/20/2017 07:47  Glucose-Capillary Latest Ref Range: 65 - 99 mg/dL 176 (H) 257 (H) 249 (H) 222 (H) 237 (H)    Diabetes history:DM2 Outpatient Diabetes medications: None Current orders for Inpatient glycemic control: Novolog 0-9 units TID with meals, Novolog 5 units TID with meals, Lantus 3 units qhs  Inpatient Diabetes Program Recommendations: Please discontinue Novolog 5 units TID with meals since steroids have been stopped and intake is very poor- this likely was the cause of the hypoglycemia 2 days ago.  Please increase Lantus to 4 units qhs  HgbA1C: A1C > 15.5% on 08/18/17 likely due to recent Decadron use as an outpatient.   Text paged Dr. Posey Pronto at 9:45am   Gentry Fitz, RN, [place], [place], CDE Diabetes Coordinator Inpatient Diabetes Program  [phone] (Team Pager) [phone] ([place]) 08/20/2017 9:45 AM

## 2017-08-20 NOTE — Progress Notes (Signed)
[place] at [facility]                                                                                                                                                                                  Patient Demographics   [place], is a 76 y.o. female, DOB - 03/25/1941, HWE:[id number]  Admit date - 08/17/2017   Admitting Physician Vaughan Basta, MD  Outpatient Primary MD for the patient is [name], NP   LOS - 3  Subjective: Patient strength is improved and upper extremity   Review of Systems:   CONSTITUTIONAL: No documented fever. pons fatigue, positiveweakness. No weight gain, no weight loss.  EYES: No blurry or double vision.  ENT: No tinnitus. No postnasal drip. No redness of the oropharynx.  RESPIRATORY: No cough, no wheeze, no hemoptysis. No dyspnea.  CARDIOVASCULAR: No chest pain. No orthopnea. No palpitations. No syncope.  GASTROINTESTINAL: No nausea, no vomiting or diarrhea. No abdominal pain. No melena or hematochezia.  GENITOURINARY: No dysuria or hematuria.  ENDOCRINE: No polyuria or nocturia. No heat or cold intolerance.  HEMATOLOGY: No anemia. No bruising. No bleeding.  INTEGUMENTARY: No rashes. No lesions.  MUSCULOSKELETAL: No arthritis. No swelling. No gout.  NEUROLOGIC: No numbness, tingling, or ataxia. No seizure-type activity.  PSYCHIATRIC: No anxiety. No insomnia. No ADD.    Vitals:   Vitals:   08/19/17 1926 08/20/17 0436 08/20/17 0829 08/20/17 1256  BP: (!) 112/52 (!) 104/43 (!) 120/44 (!) 107/40  Pulse: 77 79 75 75  Resp: 15 15 (!) 24 20  Temp: 98.7 F (37.1 C) 98.9 F (37.2 C) 97.8 F (36.6 C) 98.9 F (37.2 C)  TempSrc: Oral Oral Oral Oral  SpO2: 94% 99% (!) 88% (!) 89%  Weight:      Height:        Wt Readings from Last 3 Encounters:  08/17/17 144 lb (65.3 kg)  06/03/17 142 lb 11.2 oz (64.7 kg)  05/25/17 141 lb 6 oz (64.1 kg)     Intake/Output Summary (Last 24 hours) at 08/20/17  1400 Last data filed at 08/20/17 1021  Gross per 24 hour  Intake              340 ml  Output             1100 ml  Net             -760 ml    Physical Exam:   GENERAL: Pleasant-appearing in no apparent distress.  HEAD, EYES, EARS, NOSE AND THROAT: Atraumatic, normocephalic. Extraocular muscles are intact. Pupils equal and reactive to light. Sclerae anicteric. No conjunctival injection. No oro-pharyngeal erythema.  NECK:  Supple. There is no jugular venous distention. No bruits, no lymphadenopathy, no thyromegaly.  HEART: Regular rate and rhythm,. No murmurs, no rubs, no clicks.  LUNGS: Clear to auscultation bilaterally. No rales or rhonchi. No wheezes.  ABDOMEN: Soft, flat, nontender, nondistended. Has good bowel sounds. No hepatosplenomegaly appreciated.  EXTREMITIES: No evidence of any cyanosis, clubbing, or peripheral edema.  +2 pedal and radial pulses bilaterally.  NEUROLOGIC: The patient is alert, awake, Left upper extremity strength 4-5 SKIN: Moist and warm with no rashes appreciated.  Psych: Not anxious, depressed LN: No inguinal LN enlargement    Antibiotics   Anti-infectives    None      Medications   Scheduled Meds: . amLODipine  10 mg Oral QHS  . aspirin EC  81 mg Oral Daily  . atorvastatin  40 mg Oral q1800  . calcium-vitamin D  1 tablet Oral Daily  . carvedilol  50 mg Oral BID WC  . colchicine  0.6 mg Oral BID   And  . probenecid  500 mg Oral BID  . docusate sodium  200 mg Oral QHS  . feeding supplement (ENSURE ENLIVE)  237 mL Oral TID BM  . gabapentin  300 mg Oral TID  . heparin  5,000 Units Subcutaneous Q8H  . insulin aspart  0-9 Units Subcutaneous TID WC  . insulin glargine  6 Units Subcutaneous QHS  . levETIRAcetam  500 mg Oral BID  . Melatonin  5 mg Oral QHS  . pantoprazole  40 mg Oral Daily   Continuous Infusions:  PRN Meds:.acetaminophen, docusate sodium, ondansetron, prochlorperazine, sodium chloride flush   Data Review:   Micro Results No  results found for this or any previous visit (from the past 240 hour(s)).  Radiology Reports Ct Head Wo Contrast  Result Date: 08/17/2017 CLINICAL DATA:  76 year old female with left-sided weakness and confusion over the past 2 days. 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 Initial encounter. EXAM: CT HEAD WITHOUT CONTRAST TECHNIQUE: Contiguous axial images were obtained from the base of the skull through the vertex without intravenous contrast. COMPARISON:  06/01/2017 brain MR. 04/12/2017 head CT. FINDINGS: Brain: Posterior right frontal metastatic lesion not visualized as on prior examinations which may represent response to treatment. Contrast-enhanced MR would be best for assessing possibility of residual or progressive metastatic disease. No intracranial hemorrhage or CT evidence of large acute infarct. Prominent chronic microvascular changes. Atrophy without hydrocephalus. Vascular: Vascular calcifications Skull: Scattered lucencies similar to prior exam. Sinuses/Orbits: No acute orbital abnormality. Almost complete opacification right maxillary sinus as previously noted. Other: Mastoid air cells and middle ear cavities are clear. IMPRESSION: Posterior right frontal metastatic lesion not visualized as on prior examinations which may represent response to treatment. Contrast-enhanced MR would be best for assessing possibility of residual or progressive metastatic disease. No intracranial hemorrhage or CT evidence of large acute infarct. Prominent chronic microvascular changes. Atrophy without hydrocephalus. Almost complete opacification right maxillary sinus as previously noted. Electronically Signed   By: Genia Del M.D.   On: 08/17/2017 13:07   Mr Jodene Nam Head Wo Contrast  Result Date: 08/19/2017 CLINICAL DATA:  Acute posterior right MCA infarct on MRI. Encephalopathy. History of lung cancer with brain metastases. EXAM: MRA HEAD  WITHOUT CONTRAST TECHNIQUE: Angiographic images of the Circle of Willis were obtained using MRA technique without intravenous contrast. COMPARISON:  Brain MRI 08/19/2017.  No prior angiographic imaging. FINDINGS: The visualized distal vertebral arteries are patent to the basilar with the  right being strongly dominant. Patent right PICA and bilateral SCA origins are identified. The left PICA origin was not imaged. The basilar artery is widely patent. There is a fetal origin of the left PCA. Both PCAs are patent without evidence of significant stenosis. The internal carotid arteries are widely patent from skullbase to carotid termini. The MCAs are patent without significant M1 stenosis. 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 Proximal right M2 branches are patent, however there is moderate attenuation of both superior and inferior M2 divisions on the right with a slightly decreased number of more distal branch vessels compared to the left. ACAs are patent without evidence of significant stenosis. IMPRESSION: 1. Right MCA branch vessel attenuation without proximal occlusion or significant proximal stenosis. 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 Electronically Signed   By: Logan Bores M.D.   On: 08/19/2017 13:03   Mr Brain Wo Contrast  Result Date: 08/19/2017 CLINICAL DATA:  Intermittent confusion for a few days, evaluate encephalopathy. 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 EXAM: MRI HEAD WITHOUT CONTRAST TECHNIQUE: Multiplanar, multiecho pulse sequences of the brain and surrounding structures were obtained without intravenous contrast. COMPARISON:  CT HEAD August 17, 2017 and MRI of the head June 01, 2017 FINDINGS: INTRACRANIAL CONTENTS: Confluent RIGHT temporoparietal predominately cortical reduced diffusion with low ADC values and T2 bright signal. Spurs reduced diffusion associated with the RIGHT frontal known  metastasis with similar susceptibility artifact. No midline shift or mass effect. Old RIGHT basal ganglia lacunar infarct. Confluent similar supratentorial and patchy pontine white matter FLAIR T2 hyperintensities exclusive of the aforementioned abnormality. Ventricles and sulci are normal for patient's age. No abnormal extra-axial fluid collections. VASCULAR: Normal major intracranial vascular flow voids present at skull base. SKULL AND UPPER CERVICAL SPINE: No abnormal sellar expansion. No suspicious calvarial bone marrow signal. Craniocervical junction maintained. SINUSES/ORBITS: Chronic RIGHT maxillary sinusitis. RIGHT fronto ethmoidal mucosal thickening. Trace RIGHT mastoid effusion.The included ocular globes and orbital contents are non-suspicious. Status post bilateral ocular lens implants. OTHER: None. IMPRESSION: 1. 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 2. Treated RIGHT frontal lobe metastasis. 3. Moderate to severe chronic small vessel ischemic disease. Old RIGHT basal ganglia lacunar infarct. 4. These results will be called to the ordering clinician or representative by the Radiologist Assistant, and communication documented in the PACS or zVision Dashboard. Electronically Signed   By: Elon Alas M.D.   On: 08/19/2017 02:25   US Carotid Bilateral  Result Date: 08/19/2017 CLINICAL DATA:  Stroke. Confusion. Weakness. History of hypertension, hyperlipidemia and diabetes. Former smoker. EXAM: BILATERAL CAROTID DUPLEX ULTRASOUND TECHNIQUE: Pearline Cables scale imaging, color Doppler and duplex ultrasound were performed of bilateral carotid and vertebral arteries in the neck. COMPARISON:  None. FINDINGS: Criteria: Quantification of carotid stenosis is based on velocity parameters that correlate the residual internal carotid diameter with NASCET-based stenosis levels, using the diameter of the distal internal carotid lumen as the denominator for stenosis measurement. The  following velocity measurements were obtained: RIGHT ICA:  179/39 cm/sec CCA:  In 696/29 cm/sec SYSTOLIC ICA/CCA RATIO:  1.5 DIASTOLIC ICA/CCA RATIO:  2.2 ECA:  127 cm/sec LEFT ICA:  198/39 cm/sec CCA:  52/84 cm/sec SYSTOLIC ICA/CCA RATIO:  2.1 DIASTOLIC ICA/CCA RATIO:  2.5 ECA:  136 cm/sec RIGHT CAROTID ARTERY: There is a moderate amount of atherosclerotic plaque seen throughout the right common carotid artery (images 4, 8 and 12). There is a moderate to large amount  of echogenic partially shadowing plaque within the right carotid bulb (image 16), extending to involve the origin and proximal aspects of the right internal carotid artery (image 24), resulting in elevated peak systolic velocities within the mid aspect of the right internal carotid artery (greatest acquired peak systolic velocity within mid ICA measures 179 cm/sec. RIGHT VERTEBRAL ARTERY:  Antegrade flow LEFT CAROTID ARTERY: There is a minimal amount of atherosclerotic plaque within the mid and distal aspects of the left common carotid artery (image 40 and 44). There is a moderate to large amount of atherosclerotic plaque within the left carotid bulb extending to involve the origin and proximal aspects of the left internal carotid artery (images 48 and 56), resulting in elevated peak systolic velocities within the proximal and mid aspects of the left internal carotid artery (greatest acquired peak systolic velocity within mid ICA measures 198 cm/sec - image 62). LEFT VERTEBRAL ARTERY:  Antegrade flow IMPRESSION: Moderate to large amount of bilateral atherosclerotic plaque results in elevated peak systolic velocities within the bilateral internal carotid arteries compatible with the 50 to 69% luminal narrowing range bilaterally. Further evaluation with CTA could performed as clinically indicated. Electronically Signed   By: Sandi Mariscal M.D.   On: 08/19/2017 14:53   Dg Chest Portable 1 View  Result Date: 08/17/2017 CLINICAL DATA:  History of lung  carcinoma.  Hypertension.  Weakness. EXAM: PORTABLE CHEST 1 VIEW COMPARISON:  Chest radiograph April 12, 2017; PET-CT May 20, 2017 FINDINGS: Port-A-Cath tip is in the superior vena cava near the cavoatrial junction. No pneumothorax. There is patchy bibasilar atelectasis. The lungs elsewhere appear clear. These small nodular opacity in the right upper lobe seen on recent PET study is not appreciable by radiography. Heart size is upper normal with pulmonary vascularity within normal limits. There is aortic atherosclerosis. No adenopathy. No evident bone lesions. IMPRESSION: Bibasilar atelectasis. No edema or consolidation. Stable cardiac prominence. Aortic atherosclerosis present. Aortic Atherosclerosis (ICD10-I70.0). Electronically Signed   By: Lowella Grip III M.D.   On: 08/17/2017 13:40     CBC  Recent Labs Lab 08/17/17 1304 08/18/17 0418  WBC 7.3 7.3  HGB 12.5 12.9  HCT 38.3 38.5  PLT 187 187  MCV 89.9 88.1  MCH 29.4 29.6  MCHC 32.7 33.6  RDW 16.5* 16.1*  LYMPHSABS 1.0  --   MONOABS 0.8  --   EOSABS 0.1  --   BASOSABS 0.0  --     Chemistries   Recent Labs Lab 08/17/17 1304 08/18/17 0418 08/20/17 0535  NA 127* 135 134*  K 4.0 3.3* 4.0  CL 88* 100* 99*  CO2 27 28 27   GLUCOSE 721* 212* 286*  BUN 13 9 15   CREATININE 1.08* 0.66 0.93  CALCIUM 8.0* 7.8* 8.3*  MG  --  1.2* 1.8  AST 15  --   --   ALT 17  --   --   ALKPHOS 115  --   --   BILITOT 0.8  --   --    ------------------------------------------------------------------------------------------------------------------ estimated creatinine clearance is 43.1 mL/min (by C-G formula based on SCr of 0.93 mg/dL). ------------------------------------------------------------------------------------------------------------------ No results for input(s): HGBA1C in the last 72 hours. ------------------------------------------------------------------------------------------------------------------  Recent Labs   08/20/17 0535  CHOL 146  HDL 27*  LDLCALC 88  TRIG 154*  CHOLHDL 5.4   ------------------------------------------------------------------------------------------------------------------ No results for input(s): TSH, T4TOTAL, T3FREE, THYROIDAB in the last 72 hours.  Invalid input(s): FREET3 ------------------------------------------------------------------------------------------------------------------ No results for input(s): VITAMINB12, FOLATE, FERRITIN, TIBC, IRON, RETICCTPCT in the  last 72 hours.  Coagulation profile  Recent Labs Lab 08/17/17 1304  INR 1.00    No results for input(s): DDIMER in the last 72 hours.  Cardiac Enzymes  Recent Labs Lab 08/17/17 1304  TROPONINI 0.03*   ------------------------------------------------------------------------------------------------------------------ Invalid input(s): POCBNP    Assessment & Plan    *acute CVA Continue therapy with aspirin High-dose statin Carotid artery stenosis bilaterally per neurology no intervention for now Outpatient vascular follow-up  *Diabetes type 2 with Hyperglycemia Due to Decadrone- which was stopped yesterday Hemoglobin A1c greater than 15.5 Increase Lantus to 6 units continue sliding scale insulin    * Lung mass with brain mets Follows with Dr. Grayland Ormond, had radiation.   * Htn Coreg resumed  * Hyperlipidemia  continue simvastatin.  * Seizures    Cont home meds      Code Status Orders        Start     Ordered   08/17/17 1757  Do not attempt resuscitation (DNR)  Continuous    Question Answer Comment  In the event of cardiac or respiratory ARREST Do not call a "code blue"   In the event of cardiac or respiratory ARREST Do not perform Intubation, CPR, defibrillation or ACLS   In the event of cardiac or respiratory ARREST Use medication by any route, position, wound care, and other measures to relive pain and suffering. May use oxygen, suction and manual treatment of  airway obstruction as needed for comfort.      08/17/17 1757    Code Status History    Date Active Date Inactive Code Status Order ID Comments User Context   This patient has a current code status but no historical code status.           Consults  none  DVT Prophylaxis  Lovenox  Lab Results  Component Value Date   PLT 187 08/18/2017     Time Spent in minutes   32 minutes Greater than 50% of time spent in care coordination and counseling patient regarding the condition and plan of care.   Dustin Flock M.D on 08/20/2017 at 2:00 PM  Between 7am to 6pm - Pager - [phone]  After 6pm go to [email or web address] - password [email or web address] [place] [facility]   Office  [phone]

## 2017-08-21 DIAGNOSIS — G8194 Hemiplegia, unspecified affecting left nondominant side: Secondary | ICD-10-CM | POA: Diagnosis not present

## 2017-08-21 DIAGNOSIS — Z794 Long term (current) use of insulin: Secondary | ICD-10-CM | POA: Diagnosis not present

## 2017-08-21 DIAGNOSIS — I10 Essential (primary) hypertension: Secondary | ICD-10-CM | POA: Diagnosis not present

## 2017-08-21 DIAGNOSIS — I779 Disorder of arteries and arterioles, unspecified: Secondary | ICD-10-CM | POA: Diagnosis not present

## 2017-08-21 DIAGNOSIS — I639 Cerebral infarction, unspecified: Secondary | ICD-10-CM | POA: Diagnosis not present

## 2017-08-21 DIAGNOSIS — G40909 Epilepsy, unspecified, not intractable, without status epilepticus: Secondary | ICD-10-CM | POA: Diagnosis not present

## 2017-08-21 DIAGNOSIS — E119 Type 2 diabetes mellitus without complications: Secondary | ICD-10-CM | POA: Diagnosis not present

## 2017-08-21 DIAGNOSIS — E785 Hyperlipidemia, unspecified: Secondary | ICD-10-CM | POA: Diagnosis not present

## 2017-08-21 DIAGNOSIS — I69398 Other sequelae of cerebral infarction: Secondary | ICD-10-CM | POA: Diagnosis not present

## 2017-08-21 DIAGNOSIS — C3490 Malignant neoplasm of unspecified part of unspecified bronchus or lung: Secondary | ICD-10-CM | POA: Diagnosis not present

## 2017-08-21 DIAGNOSIS — R4781 Slurred speech: Secondary | ICD-10-CM | POA: Diagnosis not present

## 2017-08-21 DIAGNOSIS — C7931 Secondary malignant neoplasm of brain: Secondary | ICD-10-CM | POA: Diagnosis not present

## 2017-08-21 DIAGNOSIS — R739 Hyperglycemia, unspecified: Secondary | ICD-10-CM | POA: Diagnosis not present

## 2017-08-21 DIAGNOSIS — G4700 Insomnia, unspecified: Secondary | ICD-10-CM | POA: Diagnosis not present

## 2017-08-21 DIAGNOSIS — Z23 Encounter for immunization: Secondary | ICD-10-CM | POA: Diagnosis not present

## 2017-08-21 DIAGNOSIS — C799 Secondary malignant neoplasm of unspecified site: Secondary | ICD-10-CM | POA: Diagnosis not present

## 2017-08-21 LAB — GLUCOSE, CAPILLARY
Glucose-Capillary: 164 mg/dL — ABNORMAL HIGH (ref 65–99)
Glucose-Capillary: 321 mg/dL — ABNORMAL HIGH (ref 65–99)

## 2017-08-21 LAB — ECHOCARDIOGRAM COMPLETE
Height: 64 in
Weight: 2304 oz

## 2017-08-21 MED ORDER — HEPARIN SOD (PORK) LOCK FLUSH 100 UNIT/ML IV SOLN
500.0000 [IU] | Freq: Once | INTRAVENOUS | Status: DC
  Filled 2017-08-21: qty 5

## 2017-08-21 MED ORDER — CLOPIDOGREL BISULFATE 75 MG PO TABS
75.0000 mg | ORAL_TABLET | Freq: Every day | ORAL | Status: DC
  Administered 2017-08-21: 11:00:00 75 mg via ORAL
  Filled 2017-08-21: qty 1

## 2017-08-21 MED ORDER — ENSURE ENLIVE PO LIQD: 237.0000 mL | Freq: Three times a day (TID) | ORAL | 12 refills | Status: AC

## 2017-08-21 MED ORDER — INSULIN GLARGINE 100 UNIT/ML ~~LOC~~ SOLN: 6.0000 [IU] | Freq: Every day | SUBCUTANEOUS | 11 refills | Status: AC

## 2017-08-21 MED ORDER — ATORVASTATIN CALCIUM 40 MG PO TABS: 40.0000 mg | ORAL_TABLET | Freq: Every day | ORAL | Status: AC

## 2017-08-21 MED ORDER — CLOPIDOGREL BISULFATE 75 MG PO TABS: 75.0000 mg | ORAL_TABLET | Freq: Every day | ORAL | Status: AC

## 2017-08-21 NOTE — Progress Notes (Signed)
Pt for discharge to [facility] .  pts family will transport now.  Pt had been waiting for authorization. dtr at bedside. Port flushed  And deaccessed per policy. Good blood return  And flushed well with saline and  Heparin 500units.  Alert. No distress.  Report called to  Nurse  Santiago Glad at [facility].  Pt  Waiting  For dtr to return to  Transport pt to [facility].no voiced c/o.

## 2017-08-21 NOTE — Clinical Social Work Note (Signed)
Tammie Buchanan at [facility] has received authorization and patient is now discharging to [facility]. CSW informed patient's daughter and she is in agreement with discharge. Because she does not wish to wait for EMS to transport (they are very backed up at this time) she and her brother are going to take patient later this afternoon over to [facility]. Discharge information sent earlier today. Nurse to call report.  Shela Leff MSW,LCSW [phone]

## 2017-08-21 NOTE — Discharge Summary (Addendum)
[facility], 76 y.o., DOB March 30, 1941, MRN [id number]. 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 female with a known history of Lung cancer, mets to brain- Htn- is on decadrone for brain mets. For last few days have increasing confusion and weakness. Daughter called Oncologist, suggested to take to ER. Daughter denies c/o urinary symptoms, cough, fever.Blood sugar was > 800, no DKA. Patient was admitted and started on Lantus. And given aggressive IV fluids with her blood sugars improving significantly. Her hemoglobin A1c was great and 15.5. Patient's blood sugars now stable with being on Lantus. She will need a sliding scale insulin at the facility  Even after her sugars improved. Patient was having slurred speech and not doing too well the forehead MRI of the brain which confirmed a right MCA CVA. She was seen in consultation by neurology. She had bilateral carotid Dopplers which showed 50-69% stenosis. I discussed with the daughter regarding this found findings and recommended CTA of her neck. However she stated that she discussed it with Dr. Doy Mince and feel that her mother is not a candidate for any type of intervention currently. I strongly recommended patient follow-up with vascular surgery as outpatient. She is at high risk of having another stroke. Her cholesterol medications have been increased she is also added to her  Plavix.              Consults  neurology  Significant Tests:  See full reports for all details    Ct Head Wo Contrast  Result Date: 08/17/2017 CLINICAL DATA:  76 year old female with left-sided weakness and confusion over the past 2 days. 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 Initial encounter. EXAM: CT HEAD WITHOUT CONTRAST TECHNIQUE: Contiguous axial images were obtained from the base of the skull through the vertex without intravenous contrast. COMPARISON:  06/01/2017 brain MR. 04/12/2017 head CT. FINDINGS: Brain: Posterior right frontal metastatic lesion not visualized as on prior examinations which may represent response to treatment. Contrast-enhanced MR would be best for assessing possibility of residual or progressive metastatic disease. No intracranial hemorrhage or CT evidence of large acute infarct. Prominent chronic microvascular changes. Atrophy without hydrocephalus. Vascular: Vascular calcifications Skull: Scattered lucencies similar to prior exam. Sinuses/Orbits: No acute orbital abnormality. Almost complete opacification right maxillary sinus as previously noted. Other: Mastoid air cells and middle ear cavities are clear. IMPRESSION: Posterior right frontal metastatic lesion not visualized as on prior examinations which may represent response to treatment. Contrast-enhanced MR would be best for assessing possibility of residual or progressive metastatic disease. No intracranial hemorrhage or CT evidence of large acute infarct. Prominent chronic microvascular changes. Atrophy without hydrocephalus. Almost complete opacification right maxillary sinus as previously noted. Electronically Signed   By: Genia Del M.D.   On: 08/17/2017 13:07   Mr Jodene Nam Head Wo Contrast  Result Date: 08/19/2017 CLINICAL DATA:  Acute posterior right MCA infarct on MRI. Encephalopathy. History of lung cancer with brain  metastases. EXAM: MRA HEAD WITHOUT CONTRAST TECHNIQUE: Angiographic images of the  Circle of Willis were obtained using MRA technique without intravenous contrast. COMPARISON:  Brain MRI 08/19/2017.  No prior angiographic imaging. FINDINGS: The visualized distal vertebral arteries are patent to the basilar with the right being strongly dominant. Patent right PICA and bilateral SCA origins are identified. The left PICA origin was not imaged. The basilar artery is widely patent. There is a fetal origin of the left PCA. Both PCAs are patent without evidence of significant stenosis. The internal carotid arteries are widely patent from skullbase to carotid termini. The MCAs are patent without significant M1 stenosis. 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 Proximal right M2 branches are patent, however there is moderate attenuation of both superior and inferior M2 divisions on the right with a slightly decreased number of more distal branch vessels compared to the left. ACAs are patent without evidence of significant stenosis. IMPRESSION: 1. Right MCA branch vessel attenuation without proximal occlusion or significant proximal stenosis. 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 Electronically Signed   By: Logan Bores M.D.   On: 08/19/2017 13:03   Mr Brain Wo Contrast  Result Date: 08/19/2017 CLINICAL DATA:  Intermittent confusion for a few days, evaluate encephalopathy. 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 EXAM: MRI HEAD WITHOUT CONTRAST TECHNIQUE: Multiplanar, multiecho pulse sequences of the brain and surrounding structures were obtained without intravenous contrast. COMPARISON:  CT HEAD August 17, 2017 and MRI of the head June 01, 2017 FINDINGS: INTRACRANIAL CONTENTS: Confluent RIGHT temporoparietal predominately cortical reduced diffusion with low ADC values and T2 bright signal. Spurs reduced diffusion associated with  the RIGHT frontal known metastasis with similar susceptibility artifact. No midline shift or mass effect. Old RIGHT basal ganglia lacunar infarct. Confluent similar supratentorial and patchy pontine white matter FLAIR T2 hyperintensities exclusive of the aforementioned abnormality. Ventricles and sulci are normal for patient's age. No abnormal extra-axial fluid collections. VASCULAR: Normal major intracranial vascular flow voids present at skull base. SKULL AND UPPER CERVICAL SPINE: No abnormal sellar expansion. No suspicious calvarial bone marrow signal. Craniocervical junction maintained. SINUSES/ORBITS: Chronic RIGHT maxillary sinusitis. RIGHT fronto ethmoidal mucosal thickening. Trace RIGHT mastoid effusion.The included ocular globes and orbital contents are non-suspicious. Status post bilateral ocular lens implants. OTHER: None. IMPRESSION: 1. 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 2. Treated RIGHT frontal lobe metastasis. 3. Moderate to severe chronic small vessel ischemic disease. Old RIGHT basal ganglia lacunar infarct. 4. These results will be called to the ordering clinician or representative by the Radiologist Assistant, and communication documented in the PACS or zVision Dashboard. Electronically Signed   By: Elon Alas M.D.   On: 08/19/2017 02:25   US Carotid Bilateral  Result Date: 08/19/2017 CLINICAL DATA:  Stroke. Confusion. Weakness. History of hypertension, hyperlipidemia and diabetes. Former smoker. EXAM: BILATERAL CAROTID DUPLEX ULTRASOUND TECHNIQUE: Pearline Cables scale imaging, color Doppler and duplex ultrasound were performed of bilateral carotid and vertebral arteries in the neck. COMPARISON:  None. FINDINGS: Criteria: Quantification of carotid stenosis is based on velocity parameters that correlate the residual internal carotid diameter with NASCET-based stenosis levels, using the diameter of the distal internal carotid lumen as the denominator for  stenosis measurement. The following velocity measurements were obtained: RIGHT ICA:  179/39 cm/sec CCA:  In 811/57 cm/sec SYSTOLIC ICA/CCA RATIO:  1.5 DIASTOLIC ICA/CCA RATIO:  2.2 ECA:  127 cm/sec LEFT ICA:  198/39 cm/sec CCA:  26/20 cm/sec SYSTOLIC ICA/CCA RATIO:  2.1 DIASTOLIC ICA/CCA RATIO:  2.5  ECA:  136 cm/sec RIGHT CAROTID ARTERY: There is a moderate amount of atherosclerotic plaque seen throughout the right common carotid artery (images 4, 8 and 12). There is a moderate to large amount of echogenic partially shadowing plaque within the right carotid bulb (image 16), extending to involve the origin and proximal aspects of the right internal carotid artery (image 24), resulting in elevated peak systolic velocities within the mid aspect of the right internal carotid artery (greatest acquired peak systolic velocity within mid ICA measures 179 cm/sec. RIGHT VERTEBRAL ARTERY:  Antegrade flow LEFT CAROTID ARTERY: There is a minimal amount of atherosclerotic plaque within the mid and distal aspects of the left common carotid artery (image 40 and 44). There is a moderate to large amount of atherosclerotic plaque within the left carotid bulb extending to involve the origin and proximal aspects of the left internal carotid artery (images 48 and 56), resulting in elevated peak systolic velocities within the proximal and mid aspects of the left internal carotid artery (greatest acquired peak systolic velocity within mid ICA measures 198 cm/sec - image 62). LEFT VERTEBRAL ARTERY:  Antegrade flow IMPRESSION: Moderate to large amount of bilateral atherosclerotic plaque results in elevated peak systolic velocities within the bilateral internal carotid arteries compatible with the 50 to 69% luminal narrowing range bilaterally. Further evaluation with CTA could performed as clinically indicated. Electronically Signed   By: Sandi Mariscal M.D.   On: 08/19/2017 14:53   Dg Chest Portable 1 View  Result Date: 08/17/2017 CLINICAL  DATA:  History of lung carcinoma.  Hypertension.  Weakness. EXAM: PORTABLE CHEST 1 VIEW COMPARISON:  Chest radiograph April 12, 2017; PET-CT May 20, 2017 FINDINGS: Port-A-Cath tip is in the superior vena cava near the cavoatrial junction. No pneumothorax. There is patchy bibasilar atelectasis. The lungs elsewhere appear clear. These small nodular opacity in the right upper lobe seen on recent PET study is not appreciable by radiography. Heart size is upper normal with pulmonary vascularity within normal limits. There is aortic atherosclerosis. No adenopathy. No evident bone lesions. IMPRESSION: Bibasilar atelectasis. No edema or consolidation. Stable cardiac prominence. Aortic atherosclerosis present. Aortic Atherosclerosis (ICD10-I70.0). Electronically Signed   By: Lowella Grip III M.D.   On: 08/17/2017 13:40       Today   Subjective:   Tammie Buchanan  patient more awake this morning  Objective:   Blood pressure (!) 105/53, pulse 73, temperature 98.5 F (36.9 C), temperature source Oral, resp. rate 18, height 5\' 4"  (1.626 m), weight 144 lb (65.3 kg), SpO2 92 %.  .  Intake/Output Summary (Last 24 hours) at 08/21/17 0913 Last data filed at 08/20/17 2300  Gross per 24 hour  Intake              480 ml  Output              900 ml  Net             -420 ml    Exam VITAL SIGNS: Blood pressure (!) 105/53, pulse 73, temperature 98.5 F (36.9 C), temperature source Oral, resp. rate 18, height 5\' 4"  (1.626 m), weight 144 lb (65.3 kg), SpO2 92 %.  GENERAL:  76 y.o.-year-old patient lying in the bed with no acute distress.  EYES: Pupils equal, round, reactive to light and accommodation. No scleral icterus. Extraocular muscles intact.  HEENT: Head atraumatic, normocephalic. Oropharynx and nasopharynx clear.  NECK:  Supple, no jugular venous distention. No thyroid enlargement, no tenderness.  LUNGS: Normal breath  sounds bilaterally, no wheezing, rales,rhonchi or crepitation. No use of  accessory muscles of respiration.  CARDIOVASCULAR: S1, S2 normal. No murmurs, rubs, or gallops.  ABDOMEN: Soft, nontender, nondistended. Bowel sounds present. No organomegaly or mass.  EXTREMITIES: No pedal edema, cyanosis, or clubbing.  NEUROLOGIC: Cranial nerves II through XII are intact. Muscle strength  4 /5 in all left upper extremity. Sensation intact. Gait not checked.  PSYCHIATRIC: The patient is alert and oriented x 3.  SKIN: No obvious rash, lesion, or ulcer.   Data Review     CBC w Diff:  Lab Results  Component Value Date   WBC 7.3 08/18/2017   HGB 12.9 08/18/2017   HCT 38.5 08/18/2017   PLT 187 08/18/2017   LYMPHOPCT 14 08/17/2017   MONOPCT 11 08/17/2017   EOSPCT 1 08/17/2017   BASOPCT 1 08/17/2017   CMP:  Lab Results  Component Value Date   NA 134 (L) 08/20/2017   K 4.0 08/20/2017   CL 99 (L) 08/20/2017   CO2 27 08/20/2017   BUN 15 08/20/2017   CREATININE 0.93 08/20/2017   PROT 5.2 (L) 08/17/2017   ALBUMIN 2.4 (L) 08/17/2017   BILITOT 0.8 08/17/2017   ALKPHOS 115 08/17/2017   AST 15 08/17/2017   ALT 17 08/17/2017  .  Micro Results No results found for this or any previous visit (from the past 240 hour(s)).      Code Status Orders        Start     Ordered   08/17/17 4174  Do not attempt resuscitation (DNR)  Continuous    Question Answer Comment  In the event of cardiac or respiratory ARREST Do not call a "code blue"   In the event of cardiac or respiratory ARREST Do not perform Intubation, CPR, defibrillation or ACLS   In the event of cardiac or respiratory ARREST Use medication by any route, position, wound care, and other measures to relive pain and suffering. May use oxygen, suction and manual treatment of airway obstruction as needed for comfort.      08/17/17 1757    Code Status History    Date Active Date Inactive Code Status Order ID Comments User Context   This patient has a current code status but no historical code status.     Advance Directive Documentation     Most Recent Value  Type of Advance Directive  Healthcare Power of Attorney, Living will  Pre-existing out of facility DNR order (yellow form or pink MOST form)  -  "MOST" Form in Place?  -          Follow-up Information    [name], NP Follow up in 2 week(s).   Specialty:  Internal Medicine Why:  hosp f/u Contact information: [address] [place] [address] [phone]        Algernon Huxley, MD Follow up in 3 week(s).   Specialties:  Vascular Surgery, Radiology, Interventional Cardiology Why:  b/l cartoid a stenosis Contact information: [address] [place] [address] [phone]           Discharge Medications   Allergies as of 08/21/2017      Reactions   Ace Inhibitors Anaphylaxis   Allopurinol Anaphylaxis   Angiotensin Receptor Blockers Anaphylaxis   Codeine Anaphylaxis   Enalapril Swelling   Other reaction(s): SWELLING/EDEMA   Furosemide Swelling   Other reaction(s): OTHER   Hydralazine Swelling   Other reaction(s): SWELLING/EDEMA   Hydrochlorothiazide Anaphylaxis  Irbesartan Swelling   Other reaction(s): SWELLING/EDEMA   Levofloxacin Swelling   Other reaction(s): SWELLING/EDEMA   Lidocaine Swelling   Other reaction(s): SWELLING/EDEMA   Naproxen Sodium Anaphylaxis   Spironolactone Anaphylaxis   Tramadol Anaphylaxis   Other reaction(s): NAUSEA      Medication List    STOP taking these medications   amoxicillin-clavulanate 875-125 MG tablet Commonly known as:  AUGMENTIN   cloNIDine 0.3 mg/24hr patch Commonly known as:  CATAPRES - Dosed in mg/24 hr   dexamethasone 4 MG tablet Commonly known as:  DECADRON   simvastatin 20 MG tablet Commonly known as:  ZOCOR     TAKE these medications   acetaminophen 500 MG tablet Commonly known as:  TYLENOL Take 500 mg by mouth 2 (two) times daily as needed for moderate pain.   alendronate 70 MG tablet Commonly known as:   FOSAMAX Take 70 mg by mouth every Friday.   amLODipine 10 MG tablet Commonly known as:  NORVASC Take 10 mg by mouth at bedtime.   ARTIFICIAL TEARS OP Apply 1 drop to eye daily as needed (dry eyes).   aspirin EC 81 MG tablet Take 81 mg by mouth daily.   atorvastatin 40 MG tablet Commonly known as:  LIPITOR Take 1 tablet (40 mg total) by mouth daily at 6 PM.   Calcium Carb-Cholecalciferol 600-800 MG-UNIT Tabs Take 1 tablet by mouth daily.   carvedilol 25 MG tablet Commonly known as:  COREG Take 50 mg by mouth 2 (two) times daily with a meal.   cetirizine 10 MG tablet Commonly known as:  ZYRTEC Take 10 mg by mouth at bedtime.   clopidogrel 75 MG tablet Commonly known as:  PLAVIX Take 1 tablet (75 mg total) by mouth daily.   colchicine-probenecid 0.5-500 MG tablet Take 1 tablet by mouth 2 (two) times daily.   docusate sodium 100 MG capsule Commonly known as:  COLACE Take 200-400 mg by mouth at bedtime.   erythromycin ophthalmic ointment Apply to eyelid margins every night   esomeprazole 40 MG capsule Commonly known as:  NEXIUM Take 40 mg by mouth daily.   feeding supplement (ENSURE ENLIVE) Liqd Take 237 mLs by mouth 3 (three) times daily between meals.   gabapentin 300 MG capsule Commonly known as:  NEURONTIN Take 300 mg by mouth 3 (three) times daily.   insulin glargine 100 UNIT/ML injection Commonly known as:  LANTUS Inject 0.06 mLs (6 Units total) into the skin at bedtime.   levETIRAcetam 500 MG tablet Commonly known as:  KEPPRA Take 1 tablet (500 mg total) by mouth 2 (two) times daily.   Melatonin 10 MG Tabs Take 20 mg by mouth at bedtime.   ondansetron 8 MG tablet Commonly known as:  ZOFRAN Take 1 tablet (8 mg total) by mouth 2 (two) times daily as needed for refractory nausea / vomiting.   prochlorperazine 10 MG tablet Commonly known as:  COMPAZINE Take 1 tablet (10 mg total) by mouth every 6 (six) hours as needed (Nausea or vomiting).           Total Time in preparing paper work, data evaluation and todays exam - 35 minutes  Dustin Flock M.D on 08/21/2017 at 9:13 AM  [facility]   Office  [phone]

## 2017-08-21 NOTE — Consult Note (Signed)
   [facility] CM Inpatient Consult   08/21/2017  Tammie Buchanan Nov 28, 1940 [id number]   Patient screened for potential [facility] Management services. Patient is on the [facility] registry as a benefit of their [employer] . Electronic medical record reveals patient's discharge plan is SNF. [facility] Care Management services not appropriate at this time. If patient's post hospital needs change please place a [facility] Care Management consult. For questions please contact:   Asuzena Weis RN, [facility] Liaison  [phone]) Business Mobile [phone]) Toll free office

## 2017-08-21 NOTE — Clinical Social Work Placement (Signed)
   CLINICAL SOCIAL WORK PLACEMENT  NOTE  Date:  08/21/2017  Patient Details  Name: Tammie Buchanan MRN: [id number] Date of Birth: [birth year]  Clinical Social Work is seeking post-discharge placement for this patient at the [place] level of care (*CSW will initial, date and re-position this form in  chart as items are completed):  Yes   Patient/family provided with [place] Work Department's list of facilities offering this level of care within the geographic area requested by the patient (or if unable, by the patient's family).  Yes   Patient/family informed of their freedom to choose among providers that offer the needed level of care, that participate in Medicare, Medicaid or managed care program needed by the patient, have an available bed and are willing to accept the patient.  Yes   Patient/family informed of [facility]'s ownership interest in [facility] and [facility], as well as of the fact that they are under no obligation to receive care at these facilities.  PASRR submitted to EDS on 08/20/17     PASRR number received on 08/20/17     Existing PASRR number confirmed on       FL2 transmitted to all facilities in geographic area requested by pt/family on 08/20/17     FL2 transmitted to all facilities within larger geographic area on       Patient informed that his/her managed care company has contracts with or will negotiate with certain facilities, including the following:        Yes   Patient/family informed of bed offers received.  Patient chooses bed at  [facility])     Physician recommends and patient chooses bed at  [facility])    Patient to be transferred to  [facility]) on 08/21/17.  Patient to be transferred to facility by  (personal vehicle)     Patient family notified on 08/21/17 of transfer.  Name of family member notified:  daughter     PHYSICIAN       Additional Comment:     _______________________________________________ Shela Leff, LCSW 08/21/2017, 3:27 PM

## 2017-08-21 NOTE — Progress Notes (Signed)
Inpatient Diabetes Program Recommendations  AACE/ADA: New Consensus Statement on Inpatient Glycemic Control (2015)  Target Ranges:  Prepandial:   less than 140 mg/dL      Peak postprandial:   less than 180 mg/dL (1-2 hours)      Critically ill patients:  140 - 180 mg/dL   Lab Results  Component Value Date   GLUCAP 164 (H) 08/21/2017   HGBA1C >15.5 (H) 08/17/2017    Review of Glycemic Control  Results for Tammie Buchanan, Tammie Buchanan (MRN [id number]) as of 08/21/2017 09:11  Ref. Range 08/20/2017 07:47 08/20/2017 12:02 08/20/2017 16:59 08/20/2017 20:52 08/21/2017 07:33  Glucose-Capillary Latest Ref Range: 65 - 99 mg/dL 237 (H) 267 (H) 193 (H) 199 (H) 164 (H)   Diabetes history:DM2 Outpatient Diabetes medications: None Current orders for Inpatient glycemic control: Novolog 0-9 units TID with meals,  Lantus 6 units qhs  Inpatient Diabetes Program Recommendations: Agree with current medications for blood sugar management.   HgbA1C: A1C >15.5% on 08/18/17 likely due to recent Decadron use as an outpatient.   Gentry Fitz, RN, BA, MHA, CDE Diabetes Coordinator Inpatient Diabetes Program  [phone] (Team Pager) [phone] ([place]) 08/21/2017 9:14 AM

## 2017-08-21 NOTE — Progress Notes (Signed)
Physical Therapy Treatment Patient Details Name: Tammie Buchanan MRN: [id number] DOB: Dec 14, 1940 Today's Date: 08/21/2017    History of Present Illness Pt is a 76 y.o. 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 Presented to ED on 08/17/17 with increased confusion over the last 4-5 days and hyperglycemia. Pt is currently receiving chemotherapy for her cancer. 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    PT Comments    Pt much more awake and talkative this session; reports "I'm getting better!" upon entering the room. LUE strength improving compared to evaluation; able to reach out and grab PT's fingers, grip strength 3+/5, elbow flexion at least 3/5, shoulder flexion 3-/5 (AROM shoulder flexion to 45 degrees, but unable to fully elevate). Pt still requires mod-max assist for bed mobility; mod-max assist for seated balance, mod-max assist x1 for transfers and max assist x1 for gait, but pt's participation during mobility improved with decreased time to perform for all tasks. If pt does not d/c this pm, next session plan to progress exercises and mobility tasks.  Follow Up Recommendations  SNF     Equipment Recommendations  Rolling walker with 5" wheels    Recommendations for Other Services       Precautions / Restrictions Precautions Precautions: Fall Restrictions Weight Bearing Restrictions: No    Mobility  Bed Mobility Overal bed mobility: Needs Assistance Bed Mobility: Supine to Sit     Supine to sit: Mod assist;Max assist;HOB elevated     General bed mobility comments: mod-max assist for LE movement, trunk elevation with max vc's for sequencing and encouragement  Transfers Overall transfer level: Needs assistance Equipment used: None Transfers: Sit to/from Stand Sit to Stand: Max assist;Mod assist         General transfer comment: Mod-max  assist x1 for sit to/from stand; vc's for sequencing, pt responded well to "1, 2, 3, stand up!" cueing from PT as per daughter's instructions  Ambulation/Gait Ambulation/Gait assistance: Max assist Ambulation Distance (Feet): 4 Feet Assistive device: Rolling walker (2 wheeled)       General Gait Details: Max assist x1 for balance support and gait assist to chair; heavy vc's for sequencing and encouragement; very short steps, min to no foot clearance L>R   Stairs            Wheelchair Mobility    Modified Rankin (Stroke Patients Only)       Balance Overall balance assessment: Needs assistance Sitting-balance support: Feet supported;Single extremity supported Sitting balance-Leahy Scale: Zero Sitting balance - Comments: mod-max assist for maintaining seated balance without falling posterior-laterally to L side, vc's for sitting up tall; pt reports "I have a tendency to tumble!" Postural control: Left lateral lean Standing balance support: Single extremity supported Standing balance-Leahy Scale: Zero Standing balance comment: mod-max assist to maintain static and dynamic standing balance with vc's for standing upright and extending legs                            Cognition Arousal/Alertness: Awake/alert Behavior During Therapy: WFL for tasks assessed/performed Overall Cognitive Status: Impaired/Different from baseline                                 General Comments: Pt awake and much more verbal this session; some confusion, but overall pleasant and motivated for therapy, stating "  I'm getting better!" when PT asked how she was doing      Exercises Total Joint Exercises Ankle Circles/Pumps: AROM;Both;10 reps;Supine Quad Sets: AROM;Strengthening;Both;10 reps;Supine Heel Slides: Both;AROM;10 reps;Supine (LE supported for friction protection) Hip ABduction/ADduction: AROM;Both;10 reps;Supine (LE supported for friction protection)    General  Comments        Pertinent Vitals/Pain Pain Assessment: No/denies pain    Home Living                      Prior Function            PT Goals (current goals can now be found in the care plan section) Acute Rehab PT Goals Patient Stated Goal: to go home PT Goal Formulation: With patient Time For Goal Achievement: 09/01/17 Potential to Achieve Goals: Good Progress towards PT goals: Progressing toward goals    Frequency    7X/week      PT Plan Current plan remains appropriate    Co-evaluation              AM-PAC PT "6 Clicks" Daily Activity  Outcome Measure  Difficulty turning over in bed (including adjusting bedclothes, sheets and blankets)?: Unable Difficulty moving from lying on back to sitting on the side of the bed? : Unable Difficulty sitting down on and standing up from a chair with arms (e.g., wheelchair, bedside commode, etc,.)?: Unable Help needed moving to and from a bed to chair (including a wheelchair)?: Total Help needed walking in hospital room?: Total Help needed climbing 3-5 steps with a railing? : Total 6 Click Score: 6    End of Session Equipment Utilized During Treatment: Gait belt Activity Tolerance: Patient tolerated treatment well Patient left: in chair;with call bell/phone within reach;with chair alarm set;with family/visitor present Nurse Communication: Mobility status PT Visit Diagnosis: Unsteadiness on feet (R26.81);Other abnormalities of gait and mobility (R26.89);Muscle weakness (generalized) (M62.81);History of falling (Z91.81)     Time: 1000-1023 PT Time Calculation (min) (ACUTE ONLY): 23 min  Charges:                       G CodesWetzel Bjornstad, SPT 08/21/2017, 12:12 PM

## 2017-08-21 NOTE — Discharge Instructions (Signed)
[facility] [place]:  Cardiac diet, diabetic diet  DISCHARGE CONDITION:  Stable  ACTIVITY:  Activity as tolerated  OXYGEN:  Home Oxygen: No.   Oxygen Delivery: room air  DISCHARGE LOCATION:  nursing home    ADDITIONAL DISCHARGE INSTRUCTION:   If you experience worsening of your admission symptoms, develop shortness of breath, life threatening emergency, suicidal or homicidal thoughts you must seek medical attention immediately by calling 911 or calling your MD immediately  if symptoms less severe.  You Must read complete instructions/literature along with all the possible adverse reactions/side effects for all the Medicines you take and that have been prescribed to you. Take any new Medicines after you have completely understood and accpet all the possible adverse reactions/side effects.   Please note  You were cared for by a hospitalist during your hospital stay. If you have any questions about your discharge medications or the care you received while you were in the hospital after you are discharged, you can call the unit and asked to speak with the hospitalist on call if the hospitalist that took care of you is not available. Once you are discharged, your primary care physician will handle any further medical issues. Please note that NO REFILLS for any discharge medications will be authorized once you are discharged, as it is imperative that you return to your primary care physician (or establish a relationship with a primary care physician if you do not have one) for your aftercare needs so that they can reassess your need for medications and monitor your lab values.

## 2017-08-24 ENCOUNTER — Telehealth: Payer: Self-pay | Admitting: *Deleted

## 2017-08-24 DIAGNOSIS — C799 Secondary malignant neoplasm of unspecified site: Secondary | ICD-10-CM | POA: Diagnosis not present

## 2017-08-24 DIAGNOSIS — G40909 Epilepsy, unspecified, not intractable, without status epilepticus: Secondary | ICD-10-CM | POA: Diagnosis not present

## 2017-08-24 DIAGNOSIS — I779 Disorder of arteries and arterioles, unspecified: Secondary | ICD-10-CM | POA: Diagnosis not present

## 2017-08-24 DIAGNOSIS — G8194 Hemiplegia, unspecified affecting left nondominant side: Secondary | ICD-10-CM | POA: Diagnosis not present

## 2017-08-24 NOTE — Telephone Encounter (Signed)
Referral has been faxed to Hospice

## 2017-08-24 NOTE — Telephone Encounter (Signed)
Daughter called to report that patient had a stroke recently and is not a candidate for rehab. She would like a hospice referral for home hospice care.

## 2017-08-25 ENCOUNTER — Other Ambulatory Visit: Payer: Self-pay | Admitting: *Deleted

## 2017-08-25 NOTE — Patient Outreach (Signed)
[facility]) Care Management  08/25/2017  Cheyane F Teat June 09, 1941 [id number]   Noted per patient ping patient discharge from facility.  Per EPIC notes, referral made to Hospice. No [facility] care management needs identified at this time.  Plan to sign off Mary E. Laymond Purser, RN, BSN, [facility] [phone]) Business Cell  [phone]) Toll Free Office

## 2017-08-28 ENCOUNTER — Telehealth: Payer: Self-pay | Admitting: *Deleted

## 2017-08-28 NOTE — Telephone Encounter (Signed)
Daughter called to report that they are not going to give any more Plavix

## 2017-08-31 NOTE — Telephone Encounter (Signed)
Ok, thank you

## 2017-09-03 ENCOUNTER — Ambulatory Visit: Payer: Medicare HMO

## 2017-09-03 ENCOUNTER — Other Ambulatory Visit: Payer: Medicare HMO

## 2017-09-08 ENCOUNTER — Ambulatory Visit: Payer: Medicare HMO | Admitting: Oncology

## 2017-09-10 ENCOUNTER — Ambulatory Visit: Payer: Medicare HMO | Admitting: Oncology

## 2017-10-09 ENCOUNTER — Telehealth: Payer: Self-pay | Admitting: *Deleted

## 2017-10-09 MED ORDER — AMOXICILLIN-POT CLAVULANATE 875-125 MG PO TABS: 1.0000 | ORAL_TABLET | Freq: Two times a day (BID) | ORAL | 0 refills | Status: AC

## 2017-10-09 NOTE — Telephone Encounter (Signed)
Order changed per Dr. Grayland Ormond to Augmentin 875-125 2 tabs po times 7 days

## 2017-10-09 NOTE — Telephone Encounter (Signed)
levaquin 500mg  daily x 5 days.

## 2017-10-09 NOTE — Telephone Encounter (Signed)
Toni from hospice called requesting ABT for upper respiratory infection.

## 2017-10-13 ENCOUNTER — Telehealth: Payer: Self-pay | Admitting: *Deleted

## 2017-10-13 MED ORDER — MORPHINE SULFATE (CONCENTRATE) 20 MG/ML PO SOLN: ORAL | 0 refills | Status: AC

## 2017-10-13 NOTE — Telephone Encounter (Signed)
Hospice nurse requesting pain medicine. Patient having leg pain and Tylenol is not helping. Please advise

## 2017-10-13 NOTE — Telephone Encounter (Signed)
Discussed dose of narcotic naive patient with Dr Grayland Ormond and it was agreed to start patient at 5 mg instead of 10 mg of Oxycodone. When order entered, flag came up regarding Anaphylaxis with codeine and Tramadol. Spoke with Dr Grayland Ormond and he asked that the hospice nurse speak with family about what she has been able to take in the past. Vivien Rota will call me back after speaking to daughter

## 2017-10-13 NOTE — Telephone Encounter (Signed)
Daughter thinks she had MS in remote past and did not react to it. Tammie Buchanan requesting to try Roxanol at low dose. Please advise

## 2017-10-13 NOTE — Telephone Encounter (Signed)
Roxanol ordered per VO Dr Grayland Ormond, Prescription faxed

## 2017-10-13 NOTE — Telephone Encounter (Signed)
Oxycodone 10mg  q4-6 hrs as need.  #30

## 2017-10-29 ENCOUNTER — Telehealth: Payer: Self-pay | Admitting: *Deleted

## 2017-10-29 MED ORDER — PROMETHAZINE HCL 12.5 MG PO TABS: 12.5000 mg | ORAL_TABLET | Freq: Four times a day (QID) | ORAL | 0 refills | Status: AC | PRN

## 2017-10-29 MED ORDER — QUETIAPINE FUMARATE 25 MG PO TABS: 25.0000 mg | ORAL_TABLET | Freq: Every day | ORAL | 3 refills | Status: AC

## 2017-10-29 NOTE — Telephone Encounter (Addendum)
Patient restless and agitated not sleeping, she not responding to lorazepam or Haldol. Med Director suggests Seroquel 25 mg at hs . Please advise Patient has nausea intermittently asking for promethazine, this is on the Protocol and she will order a low dose. Please advise

## 2017-10-29 NOTE — Addendum Note (Signed)
Addended by: Betti Cruz on: 10/29/2017 03:22 PM   Modules accepted: Orders

## 2017-10-29 NOTE — Telephone Encounter (Signed)
Per VO Dr Grayland Ormond Seroquel 25 mg, OK for Promethazine 12.5 mg and may increase Haldol dosing. Left message on Toni's voice mail

## 2017-11-25 DIAGNOSIS — R269 Unspecified abnormalities of gait and mobility: Secondary | ICD-10-CM | POA: Diagnosis not present

## 2017-11-25 DIAGNOSIS — F05 Delirium due to known physiological condition: Secondary | ICD-10-CM | POA: Diagnosis not present

## 2017-11-25 DIAGNOSIS — Z7401 Bed confinement status: Secondary | ICD-10-CM | POA: Diagnosis not present

## 2017-12-25 DEATH — deceased

## 2018-01-07 ENCOUNTER — Other Ambulatory Visit: Payer: Self-pay | Admitting: Nurse Practitioner

## 2019-01-06 IMAGING — MR MR HEAD WO/W CM
13 series · 48 of 48 positions shown · IV contrast (12mL MULTIHANCE)
Comparison: No recent study for comparison.

Neck CT without contrast 07/20/2011. Head CT without contrast
11/14/2008.

CLINICAL DATA: 76-year-old female with lung cancer metastatic to
the brain. Small cell lung cancer. Seizure like episodes.

EXAM:
MRI HEAD WITHOUT AND WITH CONTRAST
TECHNIQUE: Multiplanar, multiecho pulse sequences of the brain and surrounding
structures were obtained without and with intravenous contrast.
CONTRAST:  12mL MULTIHANCE GADOBENATE DIMEGLUMINE 529 MG/ML IV SOLN

[Series 2: T1 · sagittal · 5.0mm · 0.45mm/px · 1 of 27 slices shown (1 of 2)]
[im 1/27]
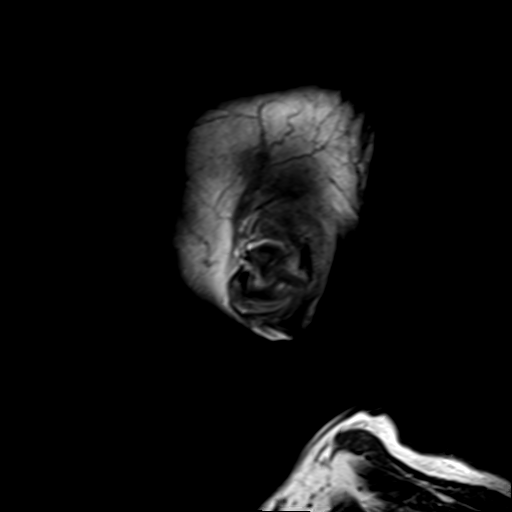

[Series 4: DWI · axial · 3.0mm · 1.80mm/px · z∈[-73,+76]mm · 3 of 55 slices shown (1 of 2)]
[im 1/55]
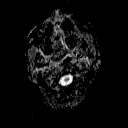
[im 28/55]
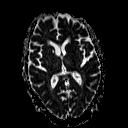
[im 55/55]
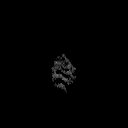

[Series 6: DWI · coronal · 3.0mm · 1.80mm/px · 3 of 49 slices shown (2 of 2)]
[im 1/49]
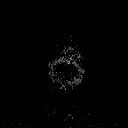
[im 25/49]
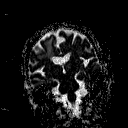
[im 49/49]
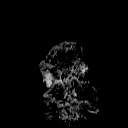

[Series 7: T2 · axial · 5.0mm · 0.60mm/px · z∈[-68,+76]mm · 2 of 25 slices shown (1 of 2)]
[im 1/25]
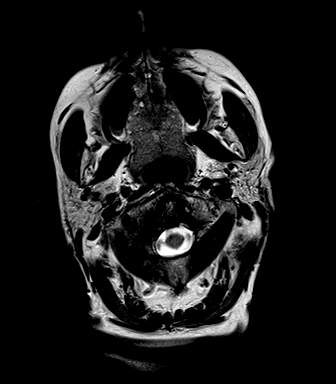
[im 25/25]
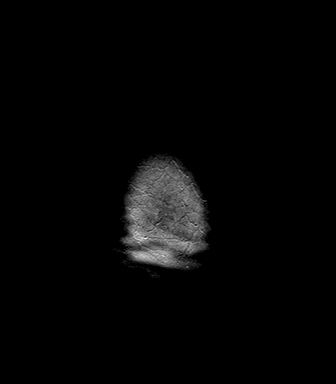

[Series 8: FLAIR · axial · 3.0mm · 0.45mm/px · z∈[-68,+76]mm · 3 of 53 slices shown]
[im 1/53]
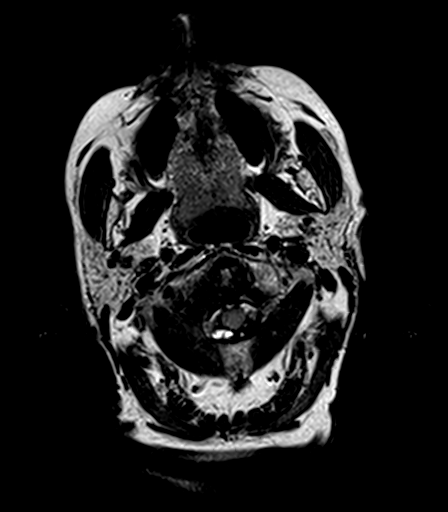
[im 27/53]
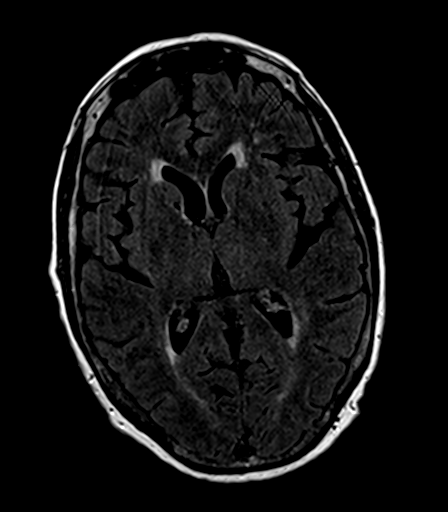
[im 53/53]
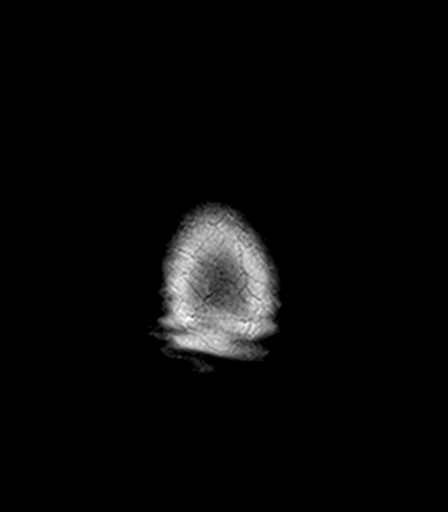

[Series 9: T2 · axial · 5.0mm · 0.45mm/px · z∈[-68,+76]mm · 2 of 25 slices shown (2 of 2)]
[im 1/25]
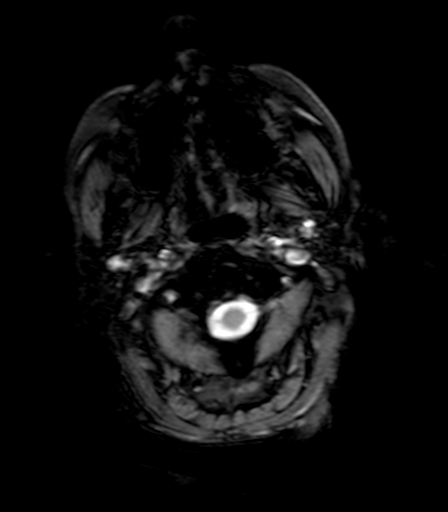
[im 25/25]
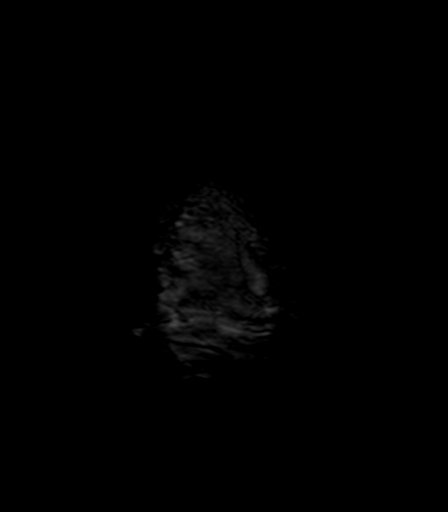

[Series 10: T1 · axial · 1.0mm · 1.00mm/px · z∈[-71,+90]mm · 11 of 176 slices shown (2 of 2)]
[im 1/176]
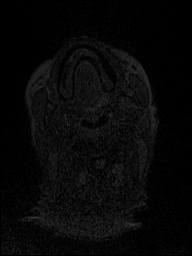
[im 18/176]
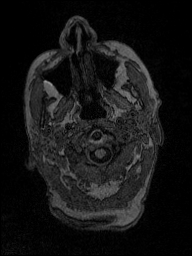
[im 36/176]
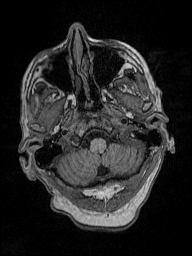
[im 53/176]
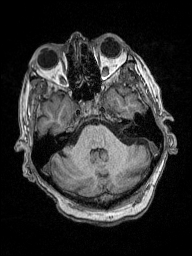
[im 71/176]
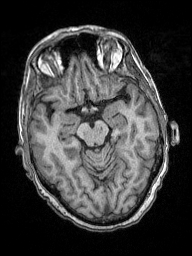
[im 88/176]
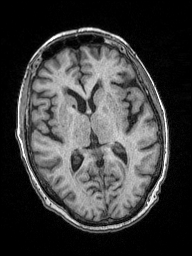
[im 106/176]
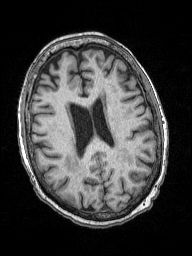
[im 123/176]
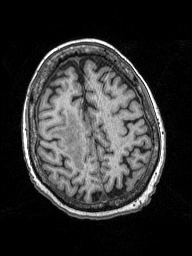
[im 141/176]
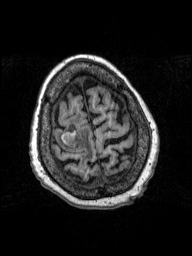
[im 158/176]
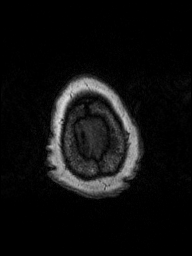
[im 176/176]
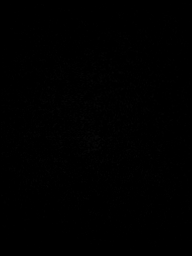

[Series 11: T2 post-contrast · coronal · 5.0mm · 0.49mm/px · 2 of 31 slices shown]
[im 1/31]
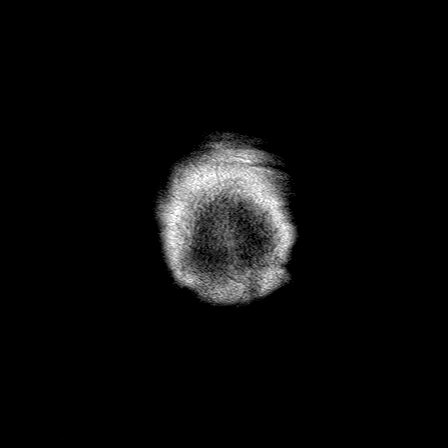
[im 31/31]
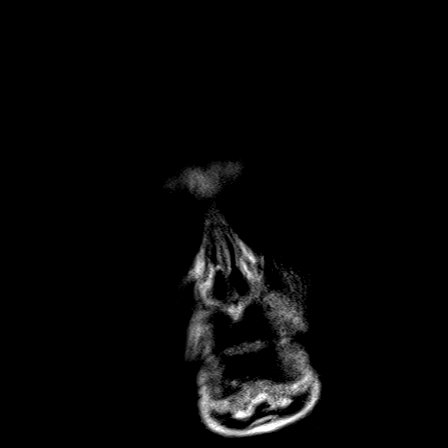

[Series 12: T1 post-contrast · axial · 1.0mm · 1.00mm/px · z∈[-71,+90]mm · 11 of 176 slices shown (1 of 3)]
[im 1/176]
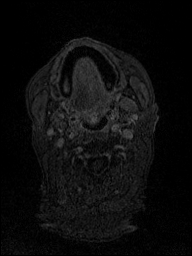
[im 18/176]
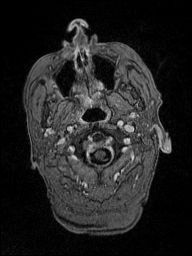
[im 36/176]
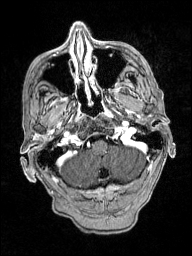
[im 53/176]
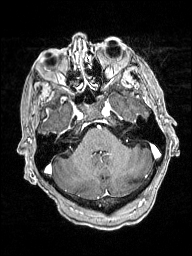
[im 71/176]
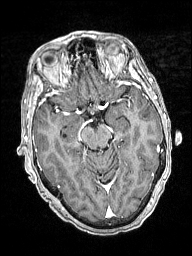
[im 88/176]
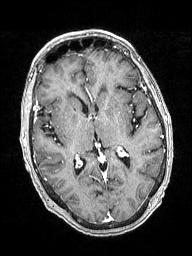
[im 106/176]
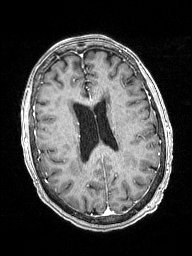
[im 123/176]
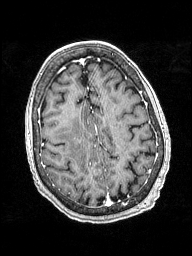
[im 141/176]
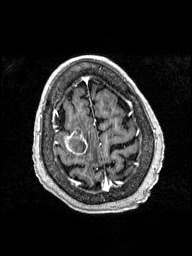
[im 158/176]
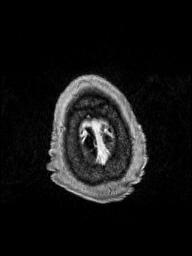
[im 176/176]
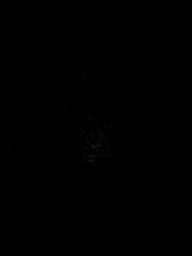

[Series 13: T1 post-contrast · coronal · 5.0mm · 0.43mm/px · 2 of 31 slices shown (2 of 3)]
[im 1/31]
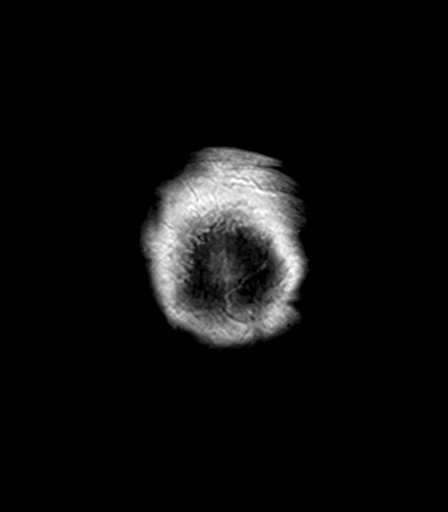
[im 31/31]
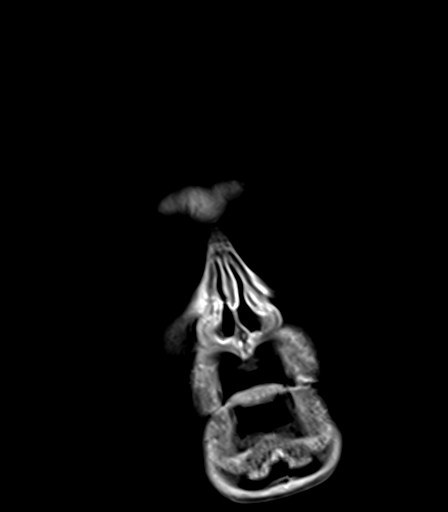

[Series 14: T1 post-contrast · sagittal · 5.0mm · 0.45mm/px · 2 of 27 slices shown (3 of 3)]
[im 1/27]
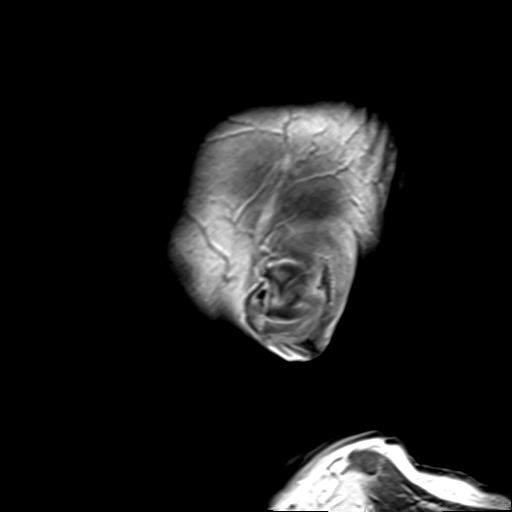
[im 27/27]
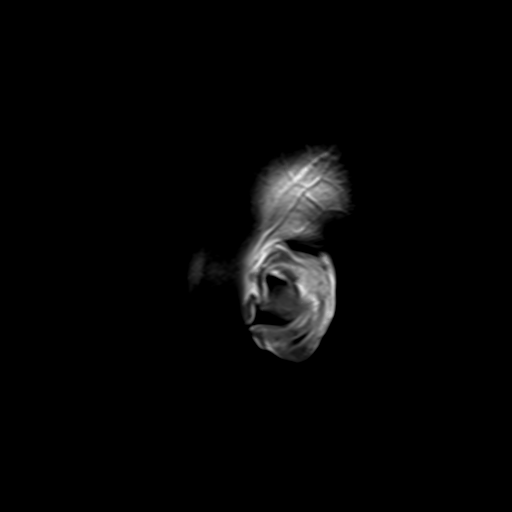

[Series 100: ax (id) · axial · 3.0mm · 1.80mm/px · z∈[-73,+76]mm · 3 of 52 slices shown]
[im 1/52]
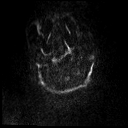
[im 26/52]
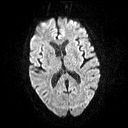
[im 52/52]
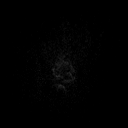

[Series 101: cor (id) · coronal · 3.0mm · 1.80mm/px · 3 of 48 slices shown]
[im 1/48]
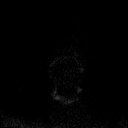
[im 24/48]
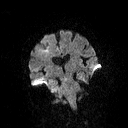
[im 48/48]
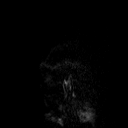

[48 of 48 positions shown; findings below may reference images not displayed]

FINDINGS: Brain: Mildly lobulated 3 cm irregular rim enhancing and partially
hemorrhagic mass in the right superior frontal gyrus. This appears
to be centered in the pre motor cortex. Heterogeneous restricted
diffusion within the mass such as can be seen following radiosurgery
treatment. Surrounding vasogenic edema. But overall mild regional
mass effect.

Wrap less pronounced wrap artifact on coronal postcontrast images.
Artifact in the right occipital lobe on postcontrast sagittal series
14, image 10.

No other brain metastasis identified.  No dural thickening.

No restricted diffusion to suggest acute infarction. No midline
shift, ventriculomegaly, extra-axial collection or acute
intracranial hemorrhage. Cervicomedullary junction and pituitary are
within normal limits. Patchy and confluent bilateral cerebral white
matter T2 and FLAIR hyperintensity. Small chronic lacune in the
right caudate nucleus. Mild patchy T2 hyperintensity in the pons.

Vascular: Major intracranial vascular flow voids are preserved, with
dominant appearing distal right vertebral artery.

Skull and upper cervical spine: Severe degenerative cervical spinal
stenosis suspected at C3-C4 (series 2, image 13) with moderate to
severe spinal cord mass effect.

Visible bone marrow signal is within normal limits.

Sinuses/Orbits: Negative; postoperative changes to both globes.

Other: Mastoids are clear. Visible internal auditory structures
appear normal. Negative scalp soft tissues.
IMPRESSION: 1. Right superior frontal gyrus 3 cm metastasis with surrounding
vasogenic edema but mild regional mass effect. Signal changes within
the lesion suggest this may have been previously radiated.
2. No other metastatic disease to the brain identified.
3. Severe degenerative cervical spinal stenosis suspected at C3-C4.
4. No other acute intracranial abnormality. Chronic small vessel
disease.
